# Patient Record
Sex: Female | Born: 1945 | Race: White | Hispanic: No | Marital: Single | Smoking: Current every day smoker
Health system: Southern US, Community
[De-identification: ages and names within clinical notes are randomized; demographics above are authoritative.]

## PROBLEM LIST (undated history)

## (undated) DIAGNOSIS — I712 Thoracic aortic aneurysm, without rupture, unspecified: Secondary | ICD-10-CM

## (undated) DIAGNOSIS — F419 Anxiety disorder, unspecified: Secondary | ICD-10-CM

## (undated) DIAGNOSIS — J449 Chronic obstructive pulmonary disease, unspecified: Secondary | ICD-10-CM

## (undated) DIAGNOSIS — F32A Depression, unspecified: Secondary | ICD-10-CM

## (undated) DIAGNOSIS — T4145XA Adverse effect of unspecified anesthetic, initial encounter: Secondary | ICD-10-CM

## (undated) DIAGNOSIS — T7840XA Allergy, unspecified, initial encounter: Secondary | ICD-10-CM

## (undated) DIAGNOSIS — M1990 Unspecified osteoarthritis, unspecified site: Secondary | ICD-10-CM

## (undated) DIAGNOSIS — H9190 Unspecified hearing loss, unspecified ear: Secondary | ICD-10-CM

## (undated) DIAGNOSIS — D699 Hemorrhagic condition, unspecified: Secondary | ICD-10-CM

## (undated) DIAGNOSIS — E785 Hyperlipidemia, unspecified: Secondary | ICD-10-CM

## (undated) DIAGNOSIS — F329 Major depressive disorder, single episode, unspecified: Secondary | ICD-10-CM

## (undated) DIAGNOSIS — E039 Hypothyroidism, unspecified: Secondary | ICD-10-CM

## (undated) DIAGNOSIS — I639 Cerebral infarction, unspecified: Secondary | ICD-10-CM

## (undated) DIAGNOSIS — T8859XA Other complications of anesthesia, initial encounter: Secondary | ICD-10-CM

## (undated) DIAGNOSIS — A809 Acute poliomyelitis, unspecified: Secondary | ICD-10-CM

## (undated) DIAGNOSIS — S52502A Unspecified fracture of the lower end of left radius, initial encounter for closed fracture: Secondary | ICD-10-CM

## (undated) DIAGNOSIS — C50919 Malignant neoplasm of unspecified site of unspecified female breast: Secondary | ICD-10-CM

## (undated) DIAGNOSIS — F17200 Nicotine dependence, unspecified, uncomplicated: Secondary | ICD-10-CM

## (undated) DIAGNOSIS — M23349 Other meniscus derangements, anterior horn of lateral meniscus, unspecified knee: Secondary | ICD-10-CM

## (undated) DIAGNOSIS — D126 Benign neoplasm of colon, unspecified: Secondary | ICD-10-CM

## (undated) DIAGNOSIS — K219 Gastro-esophageal reflux disease without esophagitis: Secondary | ICD-10-CM

## (undated) DIAGNOSIS — I10 Essential (primary) hypertension: Secondary | ICD-10-CM

## (undated) HISTORY — DX: Depression, unspecified: F32.A

## (undated) HISTORY — DX: Allergy, unspecified, initial encounter: T78.40XA

## (undated) HISTORY — DX: Benign neoplasm of colon, unspecified: D12.6

## (undated) HISTORY — PX: EYE SURGERY: SHX253

## (undated) HISTORY — DX: Unspecified osteoarthritis, unspecified site: M19.90

## (undated) HISTORY — DX: Anxiety disorder, unspecified: F41.9

## (undated) HISTORY — DX: Hemorrhagic condition, unspecified: D69.9

## (undated) HISTORY — PX: BREAST SURGERY: SHX581

## (undated) HISTORY — DX: Cerebral infarction, unspecified: I63.9

## (undated) HISTORY — DX: Acute poliomyelitis, unspecified: A80.9

## (undated) HISTORY — DX: Other meniscus derangements, anterior horn of lateral meniscus, unspecified knee: M23.349

## (undated) HISTORY — DX: Major depressive disorder, single episode, unspecified: F32.9

## (undated) HISTORY — DX: Unspecified hearing loss, unspecified ear: H91.90

## (undated) HISTORY — DX: Hyperlipidemia, unspecified: E78.5

---
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
## 2005-12-26 ENCOUNTER — Encounter (INDEPENDENT_AMBULATORY_CARE_PROVIDER_SITE_OTHER): Payer: Self-pay | Admitting: Radiology

## 2005-12-26 ENCOUNTER — Encounter (INDEPENDENT_AMBULATORY_CARE_PROVIDER_SITE_OTHER): Payer: Self-pay | Admitting: Specialist

## 2005-12-26 ENCOUNTER — Encounter: Admission: RE | Admit: 2005-12-26 | Discharge: 2005-12-26 | Payer: Self-pay | Admitting: Family Medicine

## 2005-12-30 ENCOUNTER — Ambulatory Visit: Payer: Self-pay | Admitting: Oncology

## 2006-01-05 ENCOUNTER — Encounter: Admission: RE | Admit: 2006-01-05 | Discharge: 2006-01-05 | Payer: Self-pay | Admitting: *Deleted

## 2006-01-09 ENCOUNTER — Encounter (INDEPENDENT_AMBULATORY_CARE_PROVIDER_SITE_OTHER): Payer: Self-pay | Admitting: Internal Medicine

## 2006-01-09 LAB — CONVERTED CEMR LAB

## 2006-01-17 ENCOUNTER — Encounter: Admission: RE | Admit: 2006-01-17 | Discharge: 2006-01-17 | Payer: Self-pay | Admitting: *Deleted

## 2006-01-20 ENCOUNTER — Encounter: Admission: RE | Admit: 2006-01-20 | Discharge: 2006-01-20 | Payer: Self-pay | Admitting: *Deleted

## 2006-01-20 ENCOUNTER — Ambulatory Visit (HOSPITAL_BASED_OUTPATIENT_CLINIC_OR_DEPARTMENT_OTHER): Admission: RE | Admit: 2006-01-20 | Discharge: 2006-01-20 | Payer: Self-pay | Admitting: *Deleted

## 2006-01-20 ENCOUNTER — Encounter (INDEPENDENT_AMBULATORY_CARE_PROVIDER_SITE_OTHER): Payer: Self-pay | Admitting: Specialist

## 2006-02-10 ENCOUNTER — Ambulatory Visit: Admission: RE | Admit: 2006-02-10 | Discharge: 2006-02-21 | Payer: Self-pay | Admitting: *Deleted

## 2006-02-19 ENCOUNTER — Ambulatory Visit: Payer: Self-pay | Admitting: Oncology

## 2006-02-24 ENCOUNTER — Encounter (INDEPENDENT_AMBULATORY_CARE_PROVIDER_SITE_OTHER): Payer: Self-pay | Admitting: Internal Medicine

## 2006-03-05 ENCOUNTER — Ambulatory Visit: Admission: RE | Admit: 2006-03-05 | Discharge: 2006-05-15 | Payer: Self-pay | Admitting: *Deleted

## 2006-04-17 ENCOUNTER — Ambulatory Visit: Payer: Self-pay | Admitting: Oncology

## 2006-05-13 LAB — CBC WITH DIFFERENTIAL/PLATELET
Basophils Absolute: 0 10*3/uL (ref 0.0–0.1)
EOS%: 2.9 % (ref 0.0–7.0)
HCT: 43.7 % (ref 34.8–46.6)
HGB: 15.1 g/dL (ref 11.6–15.9)
MCH: 33.4 pg (ref 26.0–34.0)
MCV: 96.7 fL (ref 81.0–101.0)
NEUT%: 62.3 % (ref 39.6–76.8)
lymph#: 1.6 10*3/uL (ref 0.9–3.3)

## 2006-05-13 LAB — COMPREHENSIVE METABOLIC PANEL
AST: 16 U/L (ref 0–37)
BUN: 8 mg/dL (ref 6–23)
Calcium: 9.5 mg/dL (ref 8.4–10.5)
Chloride: 106 mEq/L (ref 96–112)
Creatinine, Ser: 0.84 mg/dL (ref 0.40–1.20)

## 2006-06-24 ENCOUNTER — Ambulatory Visit: Payer: Self-pay | Admitting: Oncology

## 2006-06-27 LAB — CBC WITH DIFFERENTIAL/PLATELET
BASO%: 0.3 % (ref 0.0–2.0)
EOS%: 2.9 % (ref 0.0–7.0)
HCT: 44.6 % (ref 34.8–46.6)
LYMPH%: 20 % (ref 14.0–48.0)
MCH: 33.7 pg (ref 26.0–34.0)
MCHC: 34.6 g/dL (ref 32.0–36.0)
NEUT%: 66.9 % (ref 39.6–76.8)
Platelets: 271 10*3/uL (ref 145–400)

## 2006-06-27 LAB — CANCER ANTIGEN 27.29: CA 27.29: 43 U/mL — ABNORMAL HIGH (ref 0–39)

## 2006-06-27 LAB — COMPREHENSIVE METABOLIC PANEL
ALT: 14 U/L (ref 0–40)
AST: 14 U/L (ref 0–37)
Alkaline Phosphatase: 76 U/L (ref 39–117)
Creatinine, Ser: 0.87 mg/dL (ref 0.40–1.20)
Total Bilirubin: 0.9 mg/dL (ref 0.3–1.2)

## 2006-07-18 LAB — COMPREHENSIVE METABOLIC PANEL
Alkaline Phosphatase: 69 U/L (ref 39–117)
CO2: 21 mEq/L (ref 19–32)
Creatinine, Ser: 0.89 mg/dL (ref 0.40–1.20)
Glucose, Bld: 187 mg/dL — ABNORMAL HIGH (ref 70–99)
Sodium: 138 mEq/L (ref 135–145)
Total Bilirubin: 1 mg/dL (ref 0.3–1.2)
Total Protein: 6.6 g/dL (ref 6.0–8.3)

## 2006-07-18 LAB — CBC WITH DIFFERENTIAL/PLATELET
BASO%: 0.4 % (ref 0.0–2.0)
HCT: 43.4 % (ref 34.8–46.6)
LYMPH%: 26.9 % (ref 14.0–48.0)
MCHC: 34.8 g/dL (ref 32.0–36.0)
MCV: 97.7 fL (ref 81.0–101.0)
MONO%: 6 % (ref 0.0–13.0)
NEUT%: 63.7 % (ref 39.6–76.8)
Platelets: 249 10*3/uL (ref 145–400)
RBC: 4.45 10*6/uL (ref 3.70–5.32)

## 2006-07-18 LAB — CANCER ANTIGEN 27.29: CA 27.29: 33 U/mL (ref 0–39)

## 2006-09-04 ENCOUNTER — Ambulatory Visit (HOSPITAL_COMMUNITY): Admission: RE | Admit: 2006-09-04 | Discharge: 2006-09-04 | Payer: Self-pay | Admitting: Cardiology

## 2006-09-04 ENCOUNTER — Ambulatory Visit: Payer: Self-pay | Admitting: Oncology

## 2006-12-19 ENCOUNTER — Ambulatory Visit: Payer: Self-pay | Admitting: Oncology

## 2007-01-27 ENCOUNTER — Ambulatory Visit: Payer: Self-pay | Admitting: Internal Medicine

## 2007-02-03 ENCOUNTER — Ambulatory Visit: Payer: Self-pay | Admitting: *Deleted

## 2007-02-10 ENCOUNTER — Ambulatory Visit: Payer: Self-pay | Admitting: Internal Medicine

## 2007-02-20 ENCOUNTER — Ambulatory Visit: Payer: Self-pay | Admitting: Internal Medicine

## 2007-02-26 ENCOUNTER — Ambulatory Visit: Payer: Self-pay | Admitting: Internal Medicine

## 2007-03-05 ENCOUNTER — Ambulatory Visit: Payer: Self-pay | Admitting: Oncology

## 2007-03-10 LAB — CBC WITH DIFFERENTIAL/PLATELET
BASO%: 0.5 % (ref 0.0–2.0)
EOS%: 2.1 % (ref 0.0–7.0)
LYMPH%: 29 % (ref 14.0–48.0)
MCH: 33.4 pg (ref 26.0–34.0)
MCHC: 35.6 g/dL (ref 32.0–36.0)
MONO#: 0.6 10*3/uL (ref 0.1–0.9)
MONO%: 9.5 % (ref 0.0–13.0)
Platelets: 227 10*3/uL (ref 145–400)
RBC: 4.39 10*6/uL (ref 3.70–5.32)
WBC: 6.3 10*3/uL (ref 3.9–10.0)

## 2007-03-16 LAB — URINALYSIS, MICROSCOPIC - CHCC
Glucose: NEGATIVE g/dL
Nitrite: NEGATIVE

## 2007-03-26 ENCOUNTER — Ambulatory Visit: Payer: Self-pay | Admitting: Internal Medicine

## 2007-04-06 ENCOUNTER — Ambulatory Visit (HOSPITAL_COMMUNITY): Admission: RE | Admit: 2007-04-06 | Discharge: 2007-04-06 | Payer: Self-pay | Admitting: Internal Medicine

## 2007-04-16 ENCOUNTER — Ambulatory Visit: Payer: Self-pay | Admitting: Internal Medicine

## 2007-04-16 ENCOUNTER — Encounter (INDEPENDENT_AMBULATORY_CARE_PROVIDER_SITE_OTHER): Payer: Self-pay | Admitting: Internal Medicine

## 2007-05-26 ENCOUNTER — Ambulatory Visit: Payer: Self-pay | Admitting: Internal Medicine

## 2007-05-29 ENCOUNTER — Ambulatory Visit (HOSPITAL_COMMUNITY): Admission: RE | Admit: 2007-05-29 | Discharge: 2007-05-29 | Payer: Self-pay | Admitting: Internal Medicine

## 2007-06-19 ENCOUNTER — Ambulatory Visit: Payer: Self-pay | Admitting: Internal Medicine

## 2007-07-09 ENCOUNTER — Ambulatory Visit: Payer: Self-pay | Admitting: Internal Medicine

## 2007-07-10 ENCOUNTER — Encounter (INDEPENDENT_AMBULATORY_CARE_PROVIDER_SITE_OTHER): Payer: Self-pay | Admitting: Internal Medicine

## 2007-07-10 LAB — CONVERTED CEMR LAB
ALT: 16 units/L (ref 0–35)
AST: 19 units/L (ref 0–37)
CO2: 24 meq/L (ref 19–32)
Calcium: 9.3 mg/dL (ref 8.4–10.5)
Chloride: 99 meq/L (ref 96–112)
Eosinophils Absolute: 0.4 10*3/uL (ref 0.0–0.7)
Hemoglobin: 14.8 g/dL (ref 12.0–15.0)
Lymphocytes Relative: 30 % (ref 12–46)
MCV: 99.1 fL (ref 78.0–100.0)
Monocytes Absolute: 0.7 10*3/uL (ref 0.2–0.7)
Monocytes Relative: 11 % (ref 3–11)
Potassium: 4.4 meq/L (ref 3.5–5.3)
RBC: 4.43 M/uL (ref 3.87–5.11)
Sodium: 135 meq/L (ref 135–145)
Total Bilirubin: 0.7 mg/dL (ref 0.3–1.2)
WBC: 6.3 10*3/uL (ref 4.0–10.5)

## 2007-08-21 ENCOUNTER — Encounter (INDEPENDENT_AMBULATORY_CARE_PROVIDER_SITE_OTHER): Payer: Self-pay | Admitting: Internal Medicine

## 2007-08-21 DIAGNOSIS — E785 Hyperlipidemia, unspecified: Secondary | ICD-10-CM

## 2007-08-21 DIAGNOSIS — K219 Gastro-esophageal reflux disease without esophagitis: Secondary | ICD-10-CM | POA: Insufficient documentation

## 2007-08-21 DIAGNOSIS — J4489 Other specified chronic obstructive pulmonary disease: Secondary | ICD-10-CM | POA: Insufficient documentation

## 2007-08-21 DIAGNOSIS — J449 Chronic obstructive pulmonary disease, unspecified: Secondary | ICD-10-CM

## 2007-08-26 ENCOUNTER — Telehealth (INDEPENDENT_AMBULATORY_CARE_PROVIDER_SITE_OTHER): Payer: Self-pay | Admitting: Internal Medicine

## 2007-08-26 ENCOUNTER — Encounter (INDEPENDENT_AMBULATORY_CARE_PROVIDER_SITE_OTHER): Payer: Self-pay | Admitting: *Deleted

## 2007-09-03 ENCOUNTER — Ambulatory Visit: Payer: Self-pay | Admitting: Oncology

## 2007-09-07 LAB — COMPREHENSIVE METABOLIC PANEL
ALT: 19 U/L (ref 0–35)
AST: 22 U/L (ref 0–37)
Albumin: 4.5 g/dL (ref 3.5–5.2)
Alkaline Phosphatase: 58 U/L (ref 39–117)
BUN: 9 mg/dL (ref 6–23)
CO2: 25 mEq/L (ref 19–32)
Calcium: 9.7 mg/dL (ref 8.4–10.5)
Chloride: 104 mEq/L (ref 96–112)
Creatinine, Ser: 0.94 mg/dL (ref 0.40–1.20)
Glucose, Bld: 101 mg/dL — ABNORMAL HIGH (ref 70–99)
Potassium: 4.1 mEq/L (ref 3.5–5.3)
Sodium: 139 mEq/L (ref 135–145)
Total Bilirubin: 0.9 mg/dL (ref 0.3–1.2)
Total Protein: 6.9 g/dL (ref 6.0–8.3)

## 2007-09-07 LAB — CBC WITH DIFFERENTIAL/PLATELET
EOS%: 2.9 % (ref 0.0–7.0)
LYMPH%: 28.3 % (ref 14.0–48.0)
MCH: 34.5 pg — ABNORMAL HIGH (ref 26.0–34.0)
MCV: 95.8 fL (ref 81.0–101.0)
MONO%: 8.4 % (ref 0.0–13.0)
Platelets: 255 10*3/uL (ref 145–400)
RBC: 4.44 10*6/uL (ref 3.70–5.32)
RDW: 13.5 % (ref 11.3–14.5)

## 2007-09-07 LAB — CANCER ANTIGEN 27.29: CA 27.29: 31 U/mL (ref 0–39)

## 2007-09-15 ENCOUNTER — Encounter (INDEPENDENT_AMBULATORY_CARE_PROVIDER_SITE_OTHER): Payer: Self-pay | Admitting: Internal Medicine

## 2007-09-25 ENCOUNTER — Ambulatory Visit: Payer: Self-pay | Admitting: Internal Medicine

## 2007-10-08 ENCOUNTER — Ambulatory Visit: Payer: Self-pay | Admitting: Internal Medicine

## 2007-10-08 DIAGNOSIS — D509 Iron deficiency anemia, unspecified: Secondary | ICD-10-CM | POA: Insufficient documentation

## 2007-10-26 ENCOUNTER — Telehealth (INDEPENDENT_AMBULATORY_CARE_PROVIDER_SITE_OTHER): Payer: Self-pay | Admitting: Internal Medicine

## 2007-11-03 ENCOUNTER — Encounter (INDEPENDENT_AMBULATORY_CARE_PROVIDER_SITE_OTHER): Payer: Self-pay | Admitting: Internal Medicine

## 2007-11-03 ENCOUNTER — Telehealth (INDEPENDENT_AMBULATORY_CARE_PROVIDER_SITE_OTHER): Payer: Self-pay | Admitting: Internal Medicine

## 2007-11-03 DIAGNOSIS — Z853 Personal history of malignant neoplasm of breast: Secondary | ICD-10-CM

## 2007-11-03 LAB — CONVERTED CEMR LAB
Iron: 114 ug/dL (ref 42–145)
Saturation Ratios: 31 % (ref 20–55)
Vitamin B-12: 451 pg/mL (ref 211–911)

## 2007-12-14 ENCOUNTER — Encounter: Admission: RE | Admit: 2007-12-14 | Discharge: 2007-12-14 | Payer: Self-pay | Admitting: Internal Medicine

## 2008-01-06 ENCOUNTER — Ambulatory Visit: Payer: Self-pay | Admitting: Internal Medicine

## 2008-02-02 ENCOUNTER — Ambulatory Visit: Payer: Self-pay | Admitting: Internal Medicine

## 2008-02-02 DIAGNOSIS — I10 Essential (primary) hypertension: Secondary | ICD-10-CM | POA: Insufficient documentation

## 2008-02-04 ENCOUNTER — Telehealth (INDEPENDENT_AMBULATORY_CARE_PROVIDER_SITE_OTHER): Payer: Self-pay | Admitting: Internal Medicine

## 2008-02-11 ENCOUNTER — Ambulatory Visit: Payer: Self-pay | Admitting: Internal Medicine

## 2008-02-11 DIAGNOSIS — M899 Disorder of bone, unspecified: Secondary | ICD-10-CM | POA: Insufficient documentation

## 2008-02-11 DIAGNOSIS — M949 Disorder of cartilage, unspecified: Secondary | ICD-10-CM

## 2008-02-17 ENCOUNTER — Encounter (INDEPENDENT_AMBULATORY_CARE_PROVIDER_SITE_OTHER): Payer: Self-pay | Admitting: Internal Medicine

## 2008-02-17 ENCOUNTER — Telehealth (INDEPENDENT_AMBULATORY_CARE_PROVIDER_SITE_OTHER): Payer: Self-pay | Admitting: *Deleted

## 2008-02-17 ENCOUNTER — Ambulatory Visit (HOSPITAL_COMMUNITY): Admission: RE | Admit: 2008-02-17 | Discharge: 2008-02-17 | Payer: Self-pay | Admitting: Internal Medicine

## 2008-02-29 ENCOUNTER — Telehealth (INDEPENDENT_AMBULATORY_CARE_PROVIDER_SITE_OTHER): Payer: Self-pay | Admitting: Internal Medicine

## 2008-03-04 ENCOUNTER — Ambulatory Visit: Payer: Self-pay | Admitting: Oncology

## 2008-03-15 ENCOUNTER — Ambulatory Visit: Payer: Self-pay | Admitting: Internal Medicine

## 2008-03-15 DIAGNOSIS — F411 Generalized anxiety disorder: Secondary | ICD-10-CM | POA: Insufficient documentation

## 2008-03-15 DIAGNOSIS — F329 Major depressive disorder, single episode, unspecified: Secondary | ICD-10-CM | POA: Insufficient documentation

## 2008-03-15 DIAGNOSIS — F1010 Alcohol abuse, uncomplicated: Secondary | ICD-10-CM | POA: Insufficient documentation

## 2008-03-16 ENCOUNTER — Telehealth (INDEPENDENT_AMBULATORY_CARE_PROVIDER_SITE_OTHER): Payer: Self-pay | Admitting: Internal Medicine

## 2008-03-23 ENCOUNTER — Ambulatory Visit: Payer: Self-pay | Admitting: Psychiatry

## 2008-04-04 ENCOUNTER — Ambulatory Visit: Payer: Self-pay | Admitting: Psychiatry

## 2008-04-07 ENCOUNTER — Telehealth (INDEPENDENT_AMBULATORY_CARE_PROVIDER_SITE_OTHER): Payer: Self-pay | Admitting: Internal Medicine

## 2008-04-13 ENCOUNTER — Ambulatory Visit: Payer: Self-pay | Admitting: Psychiatry

## 2008-04-18 ENCOUNTER — Ambulatory Visit: Payer: Self-pay | Admitting: Psychiatry

## 2008-04-25 ENCOUNTER — Ambulatory Visit: Payer: Self-pay | Admitting: Psychiatry

## 2008-05-03 ENCOUNTER — Ambulatory Visit: Payer: Self-pay | Admitting: Psychiatry

## 2008-05-06 ENCOUNTER — Ambulatory Visit: Payer: Self-pay | Admitting: Internal Medicine

## 2008-05-11 ENCOUNTER — Ambulatory Visit: Payer: Self-pay | Admitting: Psychiatry

## 2008-05-18 ENCOUNTER — Ambulatory Visit: Payer: Self-pay | Admitting: Psychiatry

## 2008-05-18 ENCOUNTER — Ambulatory Visit: Payer: Self-pay | Admitting: Oncology

## 2008-05-30 ENCOUNTER — Ambulatory Visit: Payer: Self-pay | Admitting: Psychiatry

## 2008-06-06 ENCOUNTER — Ambulatory Visit: Payer: Self-pay | Admitting: Psychiatry

## 2008-06-20 ENCOUNTER — Ambulatory Visit: Payer: Self-pay | Admitting: Psychiatry

## 2008-07-07 ENCOUNTER — Encounter (INDEPENDENT_AMBULATORY_CARE_PROVIDER_SITE_OTHER): Payer: Self-pay | Admitting: Internal Medicine

## 2008-07-07 ENCOUNTER — Ambulatory Visit: Payer: Self-pay | Admitting: Internal Medicine

## 2008-07-07 DIAGNOSIS — L570 Actinic keratosis: Secondary | ICD-10-CM

## 2008-07-07 DIAGNOSIS — R5381 Other malaise: Secondary | ICD-10-CM

## 2008-07-07 DIAGNOSIS — E039 Hypothyroidism, unspecified: Secondary | ICD-10-CM

## 2008-07-07 DIAGNOSIS — R5383 Other fatigue: Secondary | ICD-10-CM

## 2008-07-07 LAB — CONVERTED CEMR LAB
ALT: 19 units/L (ref 0–35)
Albumin: 4.6 g/dL (ref 3.5–5.2)
Basophils Absolute: 0.1 10*3/uL (ref 0.0–0.1)
Bilirubin Urine: NEGATIVE
CO2: 23 meq/L (ref 19–32)
Calcium: 10.1 mg/dL (ref 8.4–10.5)
Chlamydia, DNA Probe: NEGATIVE
Chloride: 102 meq/L (ref 96–112)
Cholesterol: 143 mg/dL (ref 0–200)
Creatinine, Ser: 0.89 mg/dL (ref 0.40–1.20)
GC Probe Amp, Genital: NEGATIVE
Hemoglobin: 15.3 g/dL — ABNORMAL HIGH (ref 12.0–15.0)
Lymphocytes Relative: 25 % (ref 12–46)
Monocytes Absolute: 0.9 10*3/uL (ref 0.1–1.0)
Neutro Abs: 4.7 10*3/uL (ref 1.7–7.7)
Neutrophils Relative %: 60 % (ref 43–77)
Potassium: 4.5 meq/L (ref 3.5–5.3)
RBC: 4.86 M/uL (ref 3.87–5.11)
RDW: 15 % (ref 11.5–15.5)
Specific Gravity, Urine: 1.01
TSH: 3.256 microintl units/mL (ref 0.350–4.50)
Total Protein: 7.1 g/dL (ref 6.0–8.3)
Urobilinogen, UA: 0.2
pH: 7

## 2008-07-08 ENCOUNTER — Encounter (INDEPENDENT_AMBULATORY_CARE_PROVIDER_SITE_OTHER): Payer: Self-pay | Admitting: Nurse Practitioner

## 2008-07-08 ENCOUNTER — Encounter (INDEPENDENT_AMBULATORY_CARE_PROVIDER_SITE_OTHER): Payer: Self-pay | Admitting: Internal Medicine

## 2008-07-15 ENCOUNTER — Telehealth (INDEPENDENT_AMBULATORY_CARE_PROVIDER_SITE_OTHER): Payer: Self-pay | Admitting: *Deleted

## 2008-09-01 ENCOUNTER — Ambulatory Visit: Payer: Self-pay | Admitting: Oncology

## 2008-09-08 LAB — CBC WITH DIFFERENTIAL/PLATELET
BASO%: 1.5 % (ref 0.0–2.0)
EOS%: 3.6 % (ref 0.0–7.0)
Eosinophils Absolute: 0.3 10*3/uL (ref 0.0–0.5)
MCH: 32.4 pg (ref 26.0–34.0)
MCHC: 34.9 g/dL (ref 32.0–36.0)
MCV: 92.6 fL (ref 81.0–101.0)
MONO%: 8.1 % (ref 0.0–13.0)
NEUT#: 6.2 10*3/uL (ref 1.5–6.5)
RBC: 4.55 10*6/uL (ref 3.70–5.32)
RDW: 13.4 % (ref 11.3–14.5)

## 2008-09-08 LAB — COMPREHENSIVE METABOLIC PANEL
ALT: 33 U/L (ref 0–35)
AST: 30 U/L (ref 0–37)
Albumin: 4 g/dL (ref 3.5–5.2)
Alkaline Phosphatase: 83 U/L (ref 39–117)
Potassium: 4.2 mEq/L (ref 3.5–5.3)
Sodium: 136 mEq/L (ref 135–145)
Total Bilirubin: 1.2 mg/dL (ref 0.3–1.2)
Total Protein: 6.9 g/dL (ref 6.0–8.3)

## 2008-09-19 ENCOUNTER — Encounter (INDEPENDENT_AMBULATORY_CARE_PROVIDER_SITE_OTHER): Payer: Self-pay | Admitting: Internal Medicine

## 2008-10-28 ENCOUNTER — Ambulatory Visit: Payer: Self-pay | Admitting: Nurse Practitioner

## 2008-10-28 DIAGNOSIS — K589 Irritable bowel syndrome without diarrhea: Secondary | ICD-10-CM | POA: Insufficient documentation

## 2008-10-28 LAB — CONVERTED CEMR LAB
Bilirubin Urine: NEGATIVE
Blood in Urine, dipstick: NEGATIVE
Glucose, Urine, Semiquant: NEGATIVE
Ketones, urine, test strip: NEGATIVE
Protein, U semiquant: NEGATIVE

## 2008-11-01 ENCOUNTER — Encounter (INDEPENDENT_AMBULATORY_CARE_PROVIDER_SITE_OTHER): Payer: Self-pay | Admitting: Internal Medicine

## 2008-11-01 ENCOUNTER — Encounter (INDEPENDENT_AMBULATORY_CARE_PROVIDER_SITE_OTHER): Payer: Self-pay | Admitting: Nurse Practitioner

## 2008-11-07 LAB — CONVERTED CEMR LAB
Basophils Relative: 1 % (ref 0–1)
Eosinophils Absolute: 0.5 10*3/uL (ref 0.0–0.7)
Hemoglobin: 14.7 g/dL (ref 12.0–15.0)
Lymphs Abs: 1.8 10*3/uL (ref 0.7–4.0)
MCHC: 34 g/dL (ref 30.0–36.0)
MCV: 95.6 fL (ref 78.0–100.0)
Monocytes Absolute: 1 10*3/uL (ref 0.1–1.0)
Monocytes Relative: 11 % (ref 3–12)
RBC: 4.52 M/uL (ref 3.87–5.11)
WBC: 8.4 10*3/uL (ref 4.0–10.5)

## 2008-11-25 ENCOUNTER — Telehealth (INDEPENDENT_AMBULATORY_CARE_PROVIDER_SITE_OTHER): Payer: Self-pay | Admitting: Internal Medicine

## 2008-12-19 ENCOUNTER — Encounter (INDEPENDENT_AMBULATORY_CARE_PROVIDER_SITE_OTHER): Payer: Self-pay | Admitting: Internal Medicine

## 2009-02-07 ENCOUNTER — Ambulatory Visit: Payer: Self-pay | Admitting: Internal Medicine

## 2009-02-07 LAB — CONVERTED CEMR LAB
Bilirubin Urine: NEGATIVE
Blood in Urine, dipstick: NEGATIVE
Protein, U semiquant: NEGATIVE
Sed Rate: 3 mm/hr (ref 0–22)
Tissue Transglutaminase Ab, IgA: 0.1 units (ref <7)
Urobilinogen, UA: 0.2

## 2009-02-08 ENCOUNTER — Encounter (INDEPENDENT_AMBULATORY_CARE_PROVIDER_SITE_OTHER): Payer: Self-pay | Admitting: Internal Medicine

## 2009-02-13 ENCOUNTER — Emergency Department (HOSPITAL_COMMUNITY): Admission: EM | Admit: 2009-02-13 | Discharge: 2009-02-13 | Payer: Self-pay | Admitting: Emergency Medicine

## 2009-02-16 ENCOUNTER — Ambulatory Visit: Payer: Self-pay | Admitting: Internal Medicine

## 2009-02-28 ENCOUNTER — Ambulatory Visit: Payer: Self-pay | Admitting: Internal Medicine

## 2009-02-28 LAB — CONVERTED CEMR LAB: Anti Nuclear Antibody(ANA): NEGATIVE

## 2009-03-01 ENCOUNTER — Encounter (INDEPENDENT_AMBULATORY_CARE_PROVIDER_SITE_OTHER): Payer: Self-pay | Admitting: Internal Medicine

## 2009-03-07 ENCOUNTER — Telehealth (INDEPENDENT_AMBULATORY_CARE_PROVIDER_SITE_OTHER): Payer: Self-pay | Admitting: Internal Medicine

## 2009-03-09 ENCOUNTER — Ambulatory Visit: Payer: Self-pay | Admitting: Oncology

## 2009-03-13 ENCOUNTER — Encounter: Admission: RE | Admit: 2009-03-13 | Discharge: 2009-04-13 | Payer: Self-pay | Admitting: Internal Medicine

## 2009-03-13 ENCOUNTER — Encounter (INDEPENDENT_AMBULATORY_CARE_PROVIDER_SITE_OTHER): Payer: Self-pay | Admitting: Internal Medicine

## 2009-03-14 LAB — CBC WITH DIFFERENTIAL/PLATELET
BASO%: 0.5 % (ref 0.0–2.0)
LYMPH%: 30 % (ref 14.0–49.7)
MCHC: 34.6 g/dL (ref 31.5–36.0)
MONO#: 0.5 10*3/uL (ref 0.1–0.9)
Platelets: 254 10*3/uL (ref 145–400)
RBC: 4.27 10*6/uL (ref 3.70–5.45)
WBC: 6.2 10*3/uL (ref 3.9–10.3)

## 2009-03-15 LAB — CANCER ANTIGEN 27.29: CA 27.29: 34 U/mL (ref 0–39)

## 2009-03-15 LAB — COMPREHENSIVE METABOLIC PANEL
ALT: 17 U/L (ref 0–35)
AST: 16 U/L (ref 0–37)
Alkaline Phosphatase: 77 U/L (ref 39–117)
CO2: 21 mEq/L (ref 19–32)
Sodium: 134 mEq/L — ABNORMAL LOW (ref 135–145)
Total Bilirubin: 0.6 mg/dL (ref 0.3–1.2)
Total Protein: 6.7 g/dL (ref 6.0–8.3)

## 2009-03-24 ENCOUNTER — Encounter (INDEPENDENT_AMBULATORY_CARE_PROVIDER_SITE_OTHER): Payer: Self-pay | Admitting: Internal Medicine

## 2009-03-24 ENCOUNTER — Encounter: Admission: RE | Admit: 2009-03-24 | Discharge: 2009-03-24 | Payer: Self-pay | Admitting: Oncology

## 2009-03-27 ENCOUNTER — Encounter (INDEPENDENT_AMBULATORY_CARE_PROVIDER_SITE_OTHER): Payer: Self-pay | Admitting: Internal Medicine

## 2009-03-28 ENCOUNTER — Ambulatory Visit: Payer: Self-pay | Admitting: Internal Medicine

## 2009-03-28 LAB — CONVERTED CEMR LAB
Bilirubin Urine: NEGATIVE
Glucose, Urine, Semiquant: NEGATIVE
Protein, U semiquant: NEGATIVE
Specific Gravity, Urine: <1.005
WBC Urine, dipstick: NEGATIVE

## 2009-03-29 ENCOUNTER — Encounter (INDEPENDENT_AMBULATORY_CARE_PROVIDER_SITE_OTHER): Payer: Self-pay | Admitting: Internal Medicine

## 2009-04-03 ENCOUNTER — Ambulatory Visit: Payer: Self-pay | Admitting: Internal Medicine

## 2009-04-03 LAB — CONVERTED CEMR LAB
BUN: 15 mg/dL (ref 6–23)
Chloride: 107 meq/L (ref 96–112)
Potassium: 4 meq/L (ref 3.5–5.3)
Sodium: 143 meq/L (ref 135–145)

## 2009-04-05 ENCOUNTER — Ambulatory Visit (HOSPITAL_COMMUNITY): Admission: RE | Admit: 2009-04-05 | Discharge: 2009-04-05 | Payer: Self-pay | Admitting: Internal Medicine

## 2009-04-07 ENCOUNTER — Telehealth (INDEPENDENT_AMBULATORY_CARE_PROVIDER_SITE_OTHER): Payer: Self-pay | Admitting: Internal Medicine

## 2009-04-14 ENCOUNTER — Ambulatory Visit: Payer: Self-pay | Admitting: Internal Medicine

## 2009-04-18 ENCOUNTER — Ambulatory Visit (HOSPITAL_COMMUNITY): Admission: RE | Admit: 2009-04-18 | Discharge: 2009-04-18 | Payer: Self-pay | Admitting: Internal Medicine

## 2009-04-19 ENCOUNTER — Ambulatory Visit: Payer: Self-pay | Admitting: Internal Medicine

## 2009-04-19 LAB — CONVERTED CEMR LAB
Bilirubin Urine: NEGATIVE
Blood in Urine, dipstick: NEGATIVE
Glucose, Urine, Semiquant: NEGATIVE
Ketones, urine, test strip: NEGATIVE
Protein, U semiquant: NEGATIVE
Urobilinogen, UA: 0.2
pH: 5.5

## 2009-04-24 ENCOUNTER — Encounter (INDEPENDENT_AMBULATORY_CARE_PROVIDER_SITE_OTHER): Payer: Self-pay | Admitting: Internal Medicine

## 2009-04-24 DIAGNOSIS — M23349 Other meniscus derangements, anterior horn of lateral meniscus, unspecified knee: Secondary | ICD-10-CM | POA: Insufficient documentation

## 2009-04-24 HISTORY — DX: Other meniscus derangements, anterior horn of lateral meniscus, unspecified knee: M23.349

## 2009-05-01 ENCOUNTER — Ambulatory Visit: Payer: Self-pay | Admitting: Oncology

## 2009-05-03 ENCOUNTER — Encounter (INDEPENDENT_AMBULATORY_CARE_PROVIDER_SITE_OTHER): Payer: Self-pay | Admitting: Internal Medicine

## 2009-05-04 ENCOUNTER — Telehealth (INDEPENDENT_AMBULATORY_CARE_PROVIDER_SITE_OTHER): Payer: Self-pay | Admitting: Internal Medicine

## 2009-05-11 ENCOUNTER — Telehealth (INDEPENDENT_AMBULATORY_CARE_PROVIDER_SITE_OTHER): Payer: Self-pay | Admitting: Internal Medicine

## 2009-05-11 ENCOUNTER — Encounter (INDEPENDENT_AMBULATORY_CARE_PROVIDER_SITE_OTHER): Payer: Self-pay | Admitting: Internal Medicine

## 2009-05-18 ENCOUNTER — Telehealth (INDEPENDENT_AMBULATORY_CARE_PROVIDER_SITE_OTHER): Payer: Self-pay | Admitting: Internal Medicine

## 2009-05-19 ENCOUNTER — Ambulatory Visit: Payer: Self-pay | Admitting: Internal Medicine

## 2009-05-19 LAB — CONVERTED CEMR LAB
Bilirubin Urine: NEGATIVE
Blood in Urine, dipstick: NEGATIVE
Glucose, Urine, Semiquant: NEGATIVE
Ketones, urine, test strip: NEGATIVE
Protein, U semiquant: NEGATIVE

## 2009-05-20 ENCOUNTER — Encounter (INDEPENDENT_AMBULATORY_CARE_PROVIDER_SITE_OTHER): Payer: Self-pay | Admitting: Nurse Practitioner

## 2009-05-25 ENCOUNTER — Telehealth (INDEPENDENT_AMBULATORY_CARE_PROVIDER_SITE_OTHER): Payer: Self-pay | Admitting: Internal Medicine

## 2009-05-31 ENCOUNTER — Telehealth (INDEPENDENT_AMBULATORY_CARE_PROVIDER_SITE_OTHER): Payer: Self-pay | Admitting: Internal Medicine

## 2009-06-02 ENCOUNTER — Ambulatory Visit: Payer: Self-pay | Admitting: Internal Medicine

## 2009-06-02 LAB — CONVERTED CEMR LAB
Bilirubin Urine: NEGATIVE
Glucose, Urine, Semiquant: NEGATIVE
Ketones, urine, test strip: NEGATIVE
Protein, U semiquant: NEGATIVE
Specific Gravity, Urine: <1.005
Urobilinogen, UA: 0.2

## 2009-06-08 ENCOUNTER — Telehealth (INDEPENDENT_AMBULATORY_CARE_PROVIDER_SITE_OTHER): Payer: Self-pay | Admitting: Internal Medicine

## 2009-06-16 ENCOUNTER — Encounter (INDEPENDENT_AMBULATORY_CARE_PROVIDER_SITE_OTHER): Payer: Self-pay | Admitting: Internal Medicine

## 2009-06-19 ENCOUNTER — Encounter (INDEPENDENT_AMBULATORY_CARE_PROVIDER_SITE_OTHER): Payer: Self-pay | Admitting: Internal Medicine

## 2009-07-05 ENCOUNTER — Ambulatory Visit: Payer: Self-pay | Admitting: Internal Medicine

## 2009-07-05 LAB — CONVERTED CEMR LAB
Bilirubin Urine: NEGATIVE
Blood in Urine, dipstick: NEGATIVE
Ketones, urine, test strip: NEGATIVE
Urobilinogen, UA: 0.2
pH: 6

## 2009-07-06 ENCOUNTER — Encounter (INDEPENDENT_AMBULATORY_CARE_PROVIDER_SITE_OTHER): Payer: Self-pay | Admitting: Internal Medicine

## 2009-07-12 ENCOUNTER — Ambulatory Visit (HOSPITAL_COMMUNITY): Admission: RE | Admit: 2009-07-12 | Discharge: 2009-07-12 | Payer: Self-pay | Admitting: Pulmonary Disease

## 2009-07-12 ENCOUNTER — Encounter (INDEPENDENT_AMBULATORY_CARE_PROVIDER_SITE_OTHER): Payer: Self-pay | Admitting: Gastroenterology

## 2009-07-12 ENCOUNTER — Encounter (INDEPENDENT_AMBULATORY_CARE_PROVIDER_SITE_OTHER): Payer: Self-pay | Admitting: Internal Medicine

## 2009-07-12 DIAGNOSIS — D126 Benign neoplasm of colon, unspecified: Secondary | ICD-10-CM

## 2009-07-12 HISTORY — DX: Benign neoplasm of colon, unspecified: D12.6

## 2009-07-13 ENCOUNTER — Encounter (INDEPENDENT_AMBULATORY_CARE_PROVIDER_SITE_OTHER): Payer: Self-pay | Admitting: Internal Medicine

## 2009-07-20 ENCOUNTER — Ambulatory Visit: Payer: Self-pay | Admitting: Nurse Practitioner

## 2009-07-24 ENCOUNTER — Encounter (INDEPENDENT_AMBULATORY_CARE_PROVIDER_SITE_OTHER): Payer: Self-pay | Admitting: Internal Medicine

## 2009-07-25 ENCOUNTER — Ambulatory Visit: Payer: Self-pay | Admitting: Oncology

## 2009-07-26 ENCOUNTER — Telehealth (INDEPENDENT_AMBULATORY_CARE_PROVIDER_SITE_OTHER): Payer: Self-pay | Admitting: Internal Medicine

## 2009-07-27 LAB — COMPREHENSIVE METABOLIC PANEL
AST: 16 U/L (ref 0–37)
Albumin: 4.4 g/dL (ref 3.5–5.2)
Alkaline Phosphatase: 70 U/L (ref 39–117)
BUN: 11 mg/dL (ref 6–23)
Creatinine, Ser: 0.97 mg/dL (ref 0.40–1.20)
Potassium: 4.2 mEq/L (ref 3.5–5.3)
Total Bilirubin: 0.7 mg/dL (ref 0.3–1.2)

## 2009-07-27 LAB — CBC WITH DIFFERENTIAL/PLATELET
Basophils Absolute: 0 10*3/uL (ref 0.0–0.1)
EOS%: 4.1 % (ref 0.0–7.0)
HGB: 14 g/dL (ref 11.6–15.9)
MCH: 33.6 pg (ref 25.1–34.0)
MCV: 95.5 fL (ref 79.5–101.0)
MONO%: 10 % (ref 0.0–14.0)
RDW: 13.5 % (ref 11.2–14.5)

## 2009-07-27 LAB — CANCER ANTIGEN 27.29: CA 27.29: 37 U/mL (ref 0–39)

## 2009-07-30 ENCOUNTER — Telehealth (INDEPENDENT_AMBULATORY_CARE_PROVIDER_SITE_OTHER): Payer: Self-pay | Admitting: *Deleted

## 2009-08-03 ENCOUNTER — Encounter (INDEPENDENT_AMBULATORY_CARE_PROVIDER_SITE_OTHER): Payer: Self-pay | Admitting: Internal Medicine

## 2009-08-07 ENCOUNTER — Telehealth (INDEPENDENT_AMBULATORY_CARE_PROVIDER_SITE_OTHER): Payer: Self-pay | Admitting: Internal Medicine

## 2009-08-18 ENCOUNTER — Telehealth (INDEPENDENT_AMBULATORY_CARE_PROVIDER_SITE_OTHER): Payer: Self-pay | Admitting: Internal Medicine

## 2009-09-07 ENCOUNTER — Ambulatory Visit: Payer: Self-pay | Admitting: Internal Medicine

## 2009-09-07 DIAGNOSIS — J309 Allergic rhinitis, unspecified: Secondary | ICD-10-CM | POA: Insufficient documentation

## 2009-09-07 LAB — CONVERTED CEMR LAB
BUN: 14 mg/dL (ref 6–23)
Blood in Urine, dipstick: NEGATIVE
CO2: 25 meq/L (ref 19–32)
Calcium: 9.8 mg/dL (ref 8.4–10.5)
Chloride: 108 meq/L (ref 96–112)
Creatinine, Ser: 0.8 mg/dL (ref 0.40–1.20)
Eosinophils Absolute: 0.3 10*3/uL (ref 0.0–0.7)
Eosinophils Relative: 4 % (ref 0–5)
Glucose, Bld: 99 mg/dL (ref 70–99)
Glucose, Urine, Semiquant: NEGATIVE
HCT: 43.8 % (ref 36.0–46.0)
Ketones, urine, test strip: NEGATIVE
Lymphs Abs: 1.6 10*3/uL (ref 0.7–4.0)
MCHC: 32.4 g/dL (ref 30.0–36.0)
MCV: 99.8 fL (ref 78.0–100.0)
Monocytes Relative: 11 % (ref 3–12)
Nitrite: NEGATIVE
Protein, U semiquant: NEGATIVE
RBC: 4.39 M/uL (ref 3.87–5.11)
TSH: 5.113 microintl units/mL — ABNORMAL HIGH (ref 0.350–4.500)
WBC: 7.4 10*3/uL (ref 4.0–10.5)

## 2009-09-14 ENCOUNTER — Encounter (INDEPENDENT_AMBULATORY_CARE_PROVIDER_SITE_OTHER): Payer: Self-pay | Admitting: Family Medicine

## 2009-09-14 ENCOUNTER — Telehealth (INDEPENDENT_AMBULATORY_CARE_PROVIDER_SITE_OTHER): Payer: Self-pay | Admitting: Internal Medicine

## 2009-09-22 ENCOUNTER — Encounter (INDEPENDENT_AMBULATORY_CARE_PROVIDER_SITE_OTHER): Payer: Self-pay | Admitting: Internal Medicine

## 2009-10-04 ENCOUNTER — Encounter (INDEPENDENT_AMBULATORY_CARE_PROVIDER_SITE_OTHER): Payer: Self-pay | Admitting: Internal Medicine

## 2009-10-16 ENCOUNTER — Encounter (INDEPENDENT_AMBULATORY_CARE_PROVIDER_SITE_OTHER): Payer: Self-pay | Admitting: Internal Medicine

## 2009-10-26 ENCOUNTER — Ambulatory Visit: Payer: Self-pay | Admitting: Oncology

## 2009-10-30 LAB — CBC WITH DIFFERENTIAL/PLATELET
BASO%: 0.8 % (ref 0.0–2.0)
MCHC: 33.8 g/dL (ref 31.5–36.0)
MONO#: 0.4 10*3/uL (ref 0.1–0.9)
RBC: 4.33 10*6/uL (ref 3.70–5.45)
RDW: 13.6 % (ref 11.2–14.5)
WBC: 7.2 10*3/uL (ref 3.9–10.3)
lymph#: 1.7 10*3/uL (ref 0.9–3.3)

## 2009-10-30 LAB — CONVERTED CEMR LAB
ALT: 15 units/L
AST: 17 units/L
Alkaline Phosphatase: 72 units/L
CO2: 23 meq/L
Calcium: 9.8 mg/dL
Glucose, Bld: 151 mg/dL
Sodium: 140 meq/L
Total Bilirubin: 0.8 mg/dL

## 2009-10-31 LAB — COMPREHENSIVE METABOLIC PANEL
ALT: 15 U/L (ref 0–35)
CO2: 23 mEq/L (ref 19–32)
Calcium: 9.8 mg/dL (ref 8.4–10.5)
Chloride: 105 mEq/L (ref 96–112)
Potassium: 4 mEq/L (ref 3.5–5.3)
Sodium: 140 mEq/L (ref 135–145)
Total Bilirubin: 0.8 mg/dL (ref 0.3–1.2)
Total Protein: 6.4 g/dL (ref 6.0–8.3)

## 2009-10-31 LAB — CANCER ANTIGEN 27.29: CA 27.29: 30 U/mL (ref 0–39)

## 2009-11-06 ENCOUNTER — Encounter (INDEPENDENT_AMBULATORY_CARE_PROVIDER_SITE_OTHER): Payer: Self-pay | Admitting: Internal Medicine

## 2009-11-30 ENCOUNTER — Encounter (INDEPENDENT_AMBULATORY_CARE_PROVIDER_SITE_OTHER): Payer: Self-pay | Admitting: Internal Medicine

## 2009-11-30 ENCOUNTER — Ambulatory Visit: Payer: Self-pay | Admitting: Oncology

## 2009-11-30 ENCOUNTER — Telehealth (INDEPENDENT_AMBULATORY_CARE_PROVIDER_SITE_OTHER): Payer: Self-pay | Admitting: Internal Medicine

## 2009-11-30 LAB — CONVERTED CEMR LAB
Bilirubin Urine: NEGATIVE
Glucose, Urine, Semiquant: NEGATIVE
Ketones, urine, test strip: NEGATIVE
Nitrite: NEGATIVE
pH: 6.5

## 2009-12-12 ENCOUNTER — Ambulatory Visit: Payer: Self-pay | Admitting: Oncology

## 2009-12-12 ENCOUNTER — Encounter (INDEPENDENT_AMBULATORY_CARE_PROVIDER_SITE_OTHER): Payer: Self-pay | Admitting: Internal Medicine

## 2009-12-14 ENCOUNTER — Ambulatory Visit: Payer: Self-pay | Admitting: Internal Medicine

## 2009-12-15 ENCOUNTER — Encounter (INDEPENDENT_AMBULATORY_CARE_PROVIDER_SITE_OTHER): Payer: Self-pay | Admitting: Internal Medicine

## 2009-12-29 ENCOUNTER — Ambulatory Visit: Payer: Self-pay | Admitting: Internal Medicine

## 2009-12-29 LAB — CONVERTED CEMR LAB
ALT: 14 units/L (ref 0–35)
Bilirubin Urine: NEGATIVE
CO2: 23 meq/L (ref 19–32)
Cholesterol: 154 mg/dL (ref 0–200)
Creatinine, Ser: 0.85 mg/dL (ref 0.40–1.20)
GC Probe Amp, Genital: NEGATIVE
Glucose, Bld: 116 mg/dL — ABNORMAL HIGH (ref 70–99)
Specific Gravity, Urine: <1.005
Total Bilirubin: 0.7 mg/dL (ref 0.3–1.2)
Total CHOL/HDL Ratio: 2.9
Triglycerides: 77 mg/dL (ref <150)
Urobilinogen, UA: 0.2
VLDL: 15 mg/dL (ref 0–40)
pH: 6.5

## 2010-01-02 ENCOUNTER — Encounter (INDEPENDENT_AMBULATORY_CARE_PROVIDER_SITE_OTHER): Payer: Self-pay | Admitting: Internal Medicine

## 2010-01-03 ENCOUNTER — Encounter (INDEPENDENT_AMBULATORY_CARE_PROVIDER_SITE_OTHER): Payer: Self-pay | Admitting: Internal Medicine

## 2010-01-11 ENCOUNTER — Encounter (INDEPENDENT_AMBULATORY_CARE_PROVIDER_SITE_OTHER): Payer: Self-pay | Admitting: Internal Medicine

## 2010-01-15 ENCOUNTER — Telehealth (INDEPENDENT_AMBULATORY_CARE_PROVIDER_SITE_OTHER): Payer: Self-pay | Admitting: Internal Medicine

## 2010-01-22 ENCOUNTER — Telehealth (INDEPENDENT_AMBULATORY_CARE_PROVIDER_SITE_OTHER): Payer: Self-pay | Admitting: Internal Medicine

## 2010-01-23 ENCOUNTER — Telehealth (INDEPENDENT_AMBULATORY_CARE_PROVIDER_SITE_OTHER): Payer: Self-pay | Admitting: Internal Medicine

## 2010-01-24 ENCOUNTER — Ambulatory Visit: Payer: Self-pay | Admitting: Physician Assistant

## 2010-01-24 LAB — CONVERTED CEMR LAB
Blood in Urine, dipstick: NEGATIVE
Ketones, urine, test strip: NEGATIVE
Nitrite: NEGATIVE
Protein, U semiquant: NEGATIVE
Urobilinogen, UA: 0.2

## 2010-01-26 ENCOUNTER — Ambulatory Visit: Payer: Self-pay | Admitting: Oncology

## 2010-01-30 LAB — CONVERTED CEMR LAB
Albumin: 4.4 g/dL
Alkaline Phosphatase: 81 units/L
BUN: 12 mg/dL
CO2: 22 meq/L
Calcium: 10.2 mg/dL
Creatinine, Ser: 0.88 mg/dL
Sodium: 138 meq/L
Total Protein: 6.9 g/dL

## 2010-01-30 LAB — CBC WITH DIFFERENTIAL/PLATELET
EOS%: 2.6 % (ref 0.0–7.0)
Eosinophils Absolute: 0.2 10*3/uL (ref 0.0–0.5)
LYMPH%: 28.8 % (ref 14.0–49.7)
MCH: 32.3 pg (ref 25.1–34.0)
MCHC: 34.3 g/dL (ref 31.5–36.0)
MCV: 94.3 fL (ref 79.5–101.0)
MONO%: 11.9 % (ref 0.0–14.0)
Platelets: 295 10*3/uL (ref 145–400)
RBC: 4.57 10*6/uL (ref 3.70–5.45)
RDW: 13.6 % (ref 11.2–14.5)

## 2010-01-30 LAB — COMPREHENSIVE METABOLIC PANEL
AST: 22 U/L (ref 0–37)
Albumin: 4.6 g/dL (ref 3.5–5.2)
Alkaline Phosphatase: 81 U/L (ref 39–117)
Glucose, Bld: 98 mg/dL (ref 70–99)
Potassium: 4.3 mEq/L (ref 3.5–5.3)
Sodium: 138 mEq/L (ref 135–145)
Total Bilirubin: 1.2 mg/dL (ref 0.3–1.2)
Total Protein: 6.9 g/dL (ref 6.0–8.3)

## 2010-02-06 ENCOUNTER — Encounter (INDEPENDENT_AMBULATORY_CARE_PROVIDER_SITE_OTHER): Payer: Self-pay | Admitting: Internal Medicine

## 2010-02-07 ENCOUNTER — Ambulatory Visit (HOSPITAL_COMMUNITY): Admission: RE | Admit: 2010-02-07 | Discharge: 2010-02-07 | Payer: Self-pay | Admitting: Oncology

## 2010-03-11 ENCOUNTER — Encounter: Payer: Self-pay | Admitting: Internal Medicine

## 2010-03-11 ENCOUNTER — Emergency Department (HOSPITAL_COMMUNITY): Admission: EM | Admit: 2010-03-11 | Discharge: 2010-03-11 | Payer: Self-pay | Admitting: Family Medicine

## 2010-03-11 LAB — CONVERTED CEMR LAB
HCT: 47 %
Hemoglobin: 16 g/dL
Potassium: 3.8 meq/L
Sodium: 139 meq/L

## 2010-04-05 ENCOUNTER — Encounter: Admission: RE | Admit: 2010-04-05 | Discharge: 2010-04-05 | Payer: Self-pay | Admitting: Oncology

## 2010-04-11 ENCOUNTER — Ambulatory Visit: Payer: Self-pay | Admitting: Oncology

## 2010-04-12 ENCOUNTER — Encounter: Payer: Self-pay | Admitting: Internal Medicine

## 2010-04-12 LAB — CONVERTED CEMR LAB
AST: 16 units/L
Alkaline Phosphatase: 81 units/L
Basophils Relative: 0.5 %
CO2: 22 meq/L
HCT: 43.1 %
Neutrophils Relative %: 55.7 %
Platelets: 253 10*3/uL
RBC: 4.56 M/uL
RDW: 14.1 %
Total Bilirubin: 0.9 mg/dL
WBC: 8.1 10*3/uL

## 2010-04-12 LAB — CBC WITH DIFFERENTIAL/PLATELET
BASO%: 0.5 % (ref 0.0–2.0)
Basophils Absolute: 0 10*3/uL (ref 0.0–0.1)
EOS%: 2.8 % (ref 0.0–7.0)
HGB: 14.5 g/dL (ref 11.6–15.9)
MCH: 31.7 pg (ref 25.1–34.0)
MONO%: 10.4 % (ref 0.0–14.0)
RBC: 4.56 10*6/uL (ref 3.70–5.45)
RDW: 14.1 % (ref 11.2–14.5)
lymph#: 2.5 10*3/uL (ref 0.9–3.3)

## 2010-04-12 LAB — COMPREHENSIVE METABOLIC PANEL
ALT: 14 U/L (ref 0–35)
AST: 16 U/L (ref 0–37)
Albumin: 4.4 g/dL (ref 3.5–5.2)
Alkaline Phosphatase: 81 U/L (ref 39–117)
BUN: 9 mg/dL (ref 6–23)
Calcium: 9.5 mg/dL (ref 8.4–10.5)
Chloride: 103 mEq/L (ref 96–112)
Potassium: 3.9 mEq/L (ref 3.5–5.3)
Sodium: 137 mEq/L (ref 135–145)
Total Protein: 6.7 g/dL (ref 6.0–8.3)

## 2010-04-18 ENCOUNTER — Encounter (INDEPENDENT_AMBULATORY_CARE_PROVIDER_SITE_OTHER): Payer: Self-pay | Admitting: Internal Medicine

## 2010-05-02 ENCOUNTER — Encounter: Payer: Self-pay | Admitting: Internal Medicine

## 2010-05-03 ENCOUNTER — Ambulatory Visit: Payer: Self-pay | Admitting: Internal Medicine

## 2010-05-03 DIAGNOSIS — F17200 Nicotine dependence, unspecified, uncomplicated: Secondary | ICD-10-CM | POA: Insufficient documentation

## 2010-05-03 DIAGNOSIS — C50919 Malignant neoplasm of unspecified site of unspecified female breast: Secondary | ICD-10-CM

## 2010-05-03 DIAGNOSIS — Z860100 Personal history of colon polyps, unspecified: Secondary | ICD-10-CM | POA: Insufficient documentation

## 2010-05-03 DIAGNOSIS — H9190 Unspecified hearing loss, unspecified ear: Secondary | ICD-10-CM | POA: Insufficient documentation

## 2010-05-03 DIAGNOSIS — M129 Arthropathy, unspecified: Secondary | ICD-10-CM | POA: Insufficient documentation

## 2010-05-03 DIAGNOSIS — R32 Unspecified urinary incontinence: Secondary | ICD-10-CM | POA: Insufficient documentation

## 2010-05-03 HISTORY — DX: Unspecified hearing loss, unspecified ear: H91.90

## 2010-05-11 ENCOUNTER — Encounter: Payer: Self-pay | Admitting: Internal Medicine

## 2010-06-27 ENCOUNTER — Telehealth: Payer: Self-pay | Admitting: Internal Medicine

## 2010-06-28 ENCOUNTER — Telehealth: Payer: Self-pay | Admitting: Internal Medicine

## 2010-07-10 ENCOUNTER — Ambulatory Visit: Payer: Self-pay | Admitting: Oncology

## 2010-07-12 ENCOUNTER — Encounter: Payer: Self-pay | Admitting: Internal Medicine

## 2010-07-12 LAB — CBC WITH DIFFERENTIAL/PLATELET
Eosinophils Absolute: 0.3 10*3/uL (ref 0.0–0.5)
HCT: 42.3 % (ref 34.8–46.6)
HGB: 14.8 g/dL (ref 11.6–15.9)
LYMPH%: 23.5 % (ref 14.0–49.7)
MONO#: 0.8 10*3/uL (ref 0.1–0.9)
NEUT#: 5.4 10*3/uL (ref 1.5–6.5)
NEUT%: 62.2 % (ref 38.4–76.8)
Platelets: 237 10*3/uL (ref 145–400)
WBC: 8.7 10*3/uL (ref 3.9–10.3)
lymph#: 2 10*3/uL (ref 0.9–3.3)

## 2010-07-12 LAB — CONVERTED CEMR LAB
AST: 15 units/L
Albumin: 4.3 g/dL
Alkaline Phosphatase: 79 units/L
CO2: 21 meq/L
Creatinine, Ser: 0.77 mg/dL
Lymphocytes, automated: 23.5 %
MCV: 92.3 fL
Neutrophils Relative %: 62.2 %
Platelets: 237 10*3/uL
Potassium: 4.2 meq/L
RBC: 4.59 M/uL
RDW: 14.3 %
Sodium: 138 meq/L

## 2010-07-13 LAB — VITAMIN D 25 HYDROXY (VIT D DEFICIENCY, FRACTURES): Vit D, 25-Hydroxy: 52 ng/mL (ref 30–89)

## 2010-07-13 LAB — COMPREHENSIVE METABOLIC PANEL
CO2: 21 mEq/L (ref 19–32)
Calcium: 9.8 mg/dL (ref 8.4–10.5)
Chloride: 106 mEq/L (ref 96–112)
Creatinine, Ser: 0.77 mg/dL (ref 0.40–1.20)
Glucose, Bld: 110 mg/dL — ABNORMAL HIGH (ref 70–99)
Sodium: 138 mEq/L (ref 135–145)
Total Bilirubin: 0.8 mg/dL (ref 0.3–1.2)
Total Protein: 6.8 g/dL (ref 6.0–8.3)

## 2010-07-13 LAB — CANCER ANTIGEN 27.29: CA 27.29: 29 U/mL (ref 0–39)

## 2010-07-26 ENCOUNTER — Ambulatory Visit (HOSPITAL_COMMUNITY): Admission: RE | Admit: 2010-07-26 | Discharge: 2010-07-26 | Payer: Self-pay | Admitting: Oncology

## 2010-07-26 ENCOUNTER — Ambulatory Visit: Payer: Self-pay | Admitting: Internal Medicine

## 2010-08-01 ENCOUNTER — Encounter: Payer: Self-pay | Admitting: Internal Medicine

## 2010-08-01 ENCOUNTER — Ambulatory Visit (HOSPITAL_COMMUNITY): Admission: RE | Admit: 2010-08-01 | Discharge: 2010-08-01 | Payer: Self-pay | Admitting: Internal Medicine

## 2010-08-01 ENCOUNTER — Ambulatory Visit: Payer: Self-pay | Admitting: Vascular Surgery

## 2010-08-02 ENCOUNTER — Telehealth: Payer: Self-pay | Admitting: Internal Medicine

## 2010-10-16 ENCOUNTER — Ambulatory Visit (HOSPITAL_BASED_OUTPATIENT_CLINIC_OR_DEPARTMENT_OTHER): Payer: Medicare Other | Admitting: Oncology

## 2010-10-18 LAB — CBC WITH DIFFERENTIAL/PLATELET
Basophils Absolute: 0.1 10*3/uL (ref 0.0–0.1)
EOS%: 3.9 % (ref 0.0–7.0)
Eosinophils Absolute: 0.3 10*3/uL (ref 0.0–0.5)
HGB: 14.3 g/dL (ref 11.6–15.9)
LYMPH%: 26.5 % (ref 14.0–49.7)
MCH: 31.7 pg (ref 25.1–34.0)
MCV: 94.7 fL (ref 79.5–101.0)
MONO%: 7.6 % (ref 0.0–14.0)
NEUT#: 5.2 10*3/uL (ref 1.5–6.5)
Platelets: 271 10*3/uL (ref 145–400)

## 2010-10-19 LAB — COMPREHENSIVE METABOLIC PANEL
AST: 13 U/L (ref 0–37)
Alkaline Phosphatase: 85 U/L (ref 39–117)
BUN: 10 mg/dL (ref 6–23)
Creatinine, Ser: 0.83 mg/dL (ref 0.40–1.20)
Glucose, Bld: 157 mg/dL — ABNORMAL HIGH (ref 70–99)
Total Bilirubin: 0.5 mg/dL (ref 0.3–1.2)

## 2010-10-25 ENCOUNTER — Encounter: Payer: Self-pay | Admitting: Internal Medicine

## 2010-11-09 ENCOUNTER — Emergency Department (HOSPITAL_COMMUNITY)
Admission: EM | Admit: 2010-11-09 | Discharge: 2010-11-09 | Payer: Self-pay | Source: Home / Self Care | Attending: Family Medicine | Admitting: Family Medicine

## 2010-12-12 ENCOUNTER — Ambulatory Visit (HOSPITAL_COMMUNITY)
Admission: RE | Admit: 2010-12-12 | Discharge: 2010-12-12 | Payer: Self-pay | Source: Home / Self Care | Attending: Internal Medicine | Admitting: Internal Medicine

## 2011-01-08 NOTE — Progress Notes (Signed)
Summary: Rx refill req  Phone Note Refill Request Message from:  Patient on June 27, 2010 9:17 AM  Refills Requested: Medication #1:  AVAPRO 150 MG  TABS 1 tab by mouth daily   Dosage confirmed as above?Dosage Confirmed   Supply Requested: 6 months  Medication #2:  LEVOTHROID 88 MCG TABS 1 tab by mouth daily   Dosage confirmed as above?Dosage Confirmed   Supply Requested: 6 months  Medication #3:  BONIVA 150 MG TABS TAKE ONE (1) TABLET ONCE A MONTH   Dosage confirmed as above?Dosage Confirmed   Supply Requested: 6 months  Medication #4:  FLONASE 50 MCG/ACT SUSP 2 sprays each nostril daily   Dosage confirmed as above?Dosage Confirmed   Supply Requested: 6 months Nitrofuriton   Method Requested: Electronic Initial call taken by: Margaret Pyle, CMA,  June 27, 2010 9:22 AM    Prescriptions: FLONASE 50 MCG/ACT SUSP (FLUTICASONE PROPIONATE) 2 sprays each nostril daily  #3 x 1   Entered by:   Margaret Pyle, CMA   Authorized by:   Newt Lukes MD   Signed by:   Margaret Pyle, CMA on 06/27/2010   Method used:   Electronically to        [facility] [address] (retail)       [phone]       [place], [place]  [address]       Ph: [phone]       Fax: [phone]   RxID:   [id number] NITROFURANTOIN MACROCRYSTAL 100 MG CAPS (NITROFURANTOIN MACROCRYSTAL) take 1 by mouth once daily as needed  #90 x 1   Entered by:   Margaret Pyle, CMA   Authorized by:   Newt Lukes MD   Signed by:   Margaret Pyle, CMA on 06/27/2010   Method used:   Electronically to        [facility] [address] (retail)       [phone]       [place], [place]  [address]       Ph: [phone]       Fax: [phone]   RxID:   [id number] BONIVA 150 MG TABS (IBANDRONATE SODIUM) TAKE ONE (1) TABLET ONCE A MONTH  #3 x 1   Entered by:   Margaret Pyle, CMA  Authorized by:   Newt Lukes MD   Signed by:   Margaret Pyle, CMA on 06/27/2010   Method used:   Electronically to        [facility] [address] (retail)       [phone]       [place], [place]  [address]       Ph: [phone]       Fax: [phone]   RxID:   [id number] LEVOTHROID 52 MCG TABS (LEVOTHYROXINE SODIUM) 1 tab by mouth daily  #90 x 1   Entered by:   Margaret Pyle, CMA   Authorized by:   Newt Lukes MD   Signed by:   Margaret Pyle, CMA on 06/27/2010   Method used:   Electronically to        [facility] [address] (retail)       [phone]       [place], [place]  [address]  Ph: [phone]       Fax: [phone]   RxID:   [id number] AVAPRO 150 MG  TABS (IRBESARTAN) 1 tab by mouth daily  #90 x 1   Entered by:   Margaret Pyle, CMA   Authorized by:   Newt Lukes MD   Signed by:   Margaret Pyle, CMA on 06/27/2010   Method used:   Electronically to        [facility] [address] (retail)       [phone]       [place], [place]  [address]       Ph: [phone]       Fax: [phone]   RxID:   [id number]

## 2011-01-08 NOTE — Progress Notes (Signed)
Summary: verify quanity  Phone Note From Pharmacy   Caller: [facility] [address] Summary of Call: Recieved e-script rx for flonase. Need to verify which quanity. It comes in 16 gm. Faxed back paper request should be 16gm with 1 addtional refills. Initial call taken by: Orlan Leavens,  June 28, 2010 10:19 AM

## 2011-01-08 NOTE — Assessment & Plan Note (Signed)
Summary: NEW MEDICARE /STATES SHE HAS MEDICARE/NWS /PT-PKG-STC   Vital Signs:  Patient profile:   65 year old female Height:      65 inches (165.10 cm) Weight:      151.0 pounds (68.64 kg) O2 Sat:      90 % on Room air Temp:     97.3 degrees F (36.28 degrees C) oral Pulse rate:   86 / minute BP sitting:   130 / 80  (right arm) Cuff size:   regular  Vitals Entered By: Orlan Leavens (May 03, 2010 9:43 AM)  O2 Flow:  Room air CC: New patient Is Patient Diabetic? No Pain Assessment Patient in pain? no        Primary Care Provider:  Newt Lukes MD  CC:  New patient.  History of Present Illness: new pt to me and our practice, here to est care - [facility] - EMR records reviewed -  1) HTN - reports compliance with ongoing medical treatment and no changes in medication dose or frequency. denies adverse side effects related to current therapy. no CP, HA or edema  2) hx breast cancer - follows with onc for same ([facility]) - s/p lumpectomt and xrt, then antiest thru 09/2008  3) hypothryoid - reports compliance with ongoing medical treatment; last changes in medication dose or frequency 01/2010. denies adverse side effects related to current therapy. no weight or skin changes, less fatigue  4) dyslipidemia - reports noncompliance with prescribed medical treatment. concerned about adverse side effects related to prior therapy. also dmit to no change in fatigue and foot pain symptoms since stopping lipitor tx  5) COPD - ongoing continued tobacco, has tried chantix x 2 weeks without success - +DOE and cough with chronic sinus and ear congestion - unresponsive to antihits and nasal steroids - - no sputum or hemopytsis -    Preventive Screening-Counseling & Management  Alcohol-Tobacco     Alcohol drinks/day: 3     Alcohol type: beer     Alcohol Counseling: to decrease amount and/or frequency of alcohol intake     Smoking Status: current     Smoking  Cessation Counseling: yes     Packs/Day: 0.75     Year Started: age 21     Tobacco Counseling: to quit use of tobacco products  Caffeine-Diet-Exercise     Does Patient Exercise: no     Depression Counseling: not indicated; screening negative for depression  Safety-Violence-Falls     Seat Belt Counseling: not indicated; patient wears seat belts     Helmet Counseling: to wear helmet when rides bicycle/motorcycle     Firearms in the Home: no firearms in the home     Firearm Counseling: not indicated; uses recommended firearm safety measures     Violence in the Home: no risk noted     Fall Risk Counseling: not indicated; no significant falls noted  Clinical Review Panels:  Prevention   Last Mammogram:  [facility].   No specific mammographic evidence of malignancy.  Assessment: BIRADS 2. (04/05/2010)   Last Pap Smear:  NEGATIVE FOR INTRAEPITHELIAL LESIONS OR MALIGNANCY. (12/29/2009)   Last Colonoscopy:  4 polyps--3 sent for path--all hyperplastic.  Dr. Baird Lyons, however, recommends repeat colonoscopy in 3 years. (07/12/2009)  Immunizations   Last Tetanus Booster:  Tdap (07/07/2008)   Last Flu Vaccine:  Fluvax 3+ (09/07/2009)   Last Pneumovax:  Pneumovax (09/25/2007)  Lipid Management   Cholesterol:  154 (12/29/2009)  LDL (bad choesterol):  85 (12/29/2009)   HDL (good cholesterol):  54 (12/29/2009)  CBC   WBC:  8.1 (04/12/2010)   RBC:  4.56 (04/12/2010)   Hgb:  14.5 (04/12/2010)   Hct:  43.1 (04/12/2010)   Platelets:  253 (04/12/2010)   MCV  94.5 (04/12/2010)   MCHC  32.4 (09/07/2009)   RDW  14.1 (04/12/2010)   PMN:  55.7 (04/12/2010)   Lymphs:  22 (09/07/2009)   Monos:  10.4 (04/12/2010)   Eosinophils:  2.8 (04/12/2010)   Basophil:  0.5 (04/12/2010)  Complete Metabolic Panel   Glucose:  93 (04/12/2010)   Sodium:  137 (04/12/2010)   Potassium:  3.9 (04/12/2010)   Chloride:  103 (04/12/2010)   CO2:  22 (04/12/2010)   BUN:  9  (04/12/2010)   Creatinine:  0.73 (04/12/2010)   Albumin:  4.4 (04/12/2010)   Total Protein:  6.7 (04/12/2010)   Calcium:  9.5 (04/12/2010)   Total Bili:  0.9 (04/12/2010)   Alk Phos:  81 (04/12/2010)   SGPT (ALT):  14 (04/12/2010)   SGOT (AST):  16 (04/12/2010)   -  Date:  04/12/2010    BG Random: 93    BUN: 9    Creatinine: 0.73    Sodium: 137    Potassium: 3.9    Chloride: 103    CO2 Total: 22    SGOT (AST): 16    SGPT (ALT): 14    T. Bilirubin: 0.9    Alk Phos: 81    Calcium: 9.5    Total Protein: 6.7    Albumin: 4.4    WBC: 8.1    HGB: 14.5    HCT: 43.1    RBC: 4.56    PLT: 253    MCV: 94.5    RDW: 14.1    Neutrophil: 55.7    Lymphs: 30.6    Monos: 10.4    Eos: 2.8    Basophil: 0.5  Date:  01/30/2010    CO2 Total: 22    SGOT (AST): 22    SGPT (ALT): 26    T. Bilirubin: 0.9    Alk Phos: 81    Calcium: 10.2    Total Protein: 6.9    Albumin: 4.4  Current Medications (verified): 1)  Avapro 150 Mg  Tabs (Irbesartan) .Marland Kitchen.. 1 Tab By Mouth Daily 2)  Levothroid 88 Mcg Tabs (Levothyroxine Sodium) .Marland Kitchen.. 1 Tab By Mouth Daily 3)  Boniva 150 Mg Tabs (Ibandronate Sodium) .... Take One (1) Tablet Once A Month 4)  Nexium 40 Mg Cpdr (Esomeprazole Magnesium) .... Take One (1) Capsule Once Daily 5)  Flonase 50 Mcg/act Susp (Fluticasone Propionate) .... 2 Sprays Each Nostril Daily 6)  Nitrofurantoin Macrocrystal 100 Mg Caps (Nitrofurantoin Macrocrystal) .... Take 1 By Mouth Once Daily As Needed  Allergies (verified): 1)  Levaquin 2)  Codeine  Past History:  Past Medical History: COPD HYPERLIPIDEMIA HYPERTENSION HYPOTHYROIDISM ANXIETY DEPRESSION OSTEOPENIA  HYPERTENSION CARCINOMA, BREAST, HX OF - left lumpectomy 01/2006 s/p XRT GERD  Allergic rhinitis Urinary incontinence   MD rooster: onc - [facility]  Past Surgical History: 1.  Age 48:  Benign right breast biopsy 2.  2/07:  Left breast lumpectomy--Dr. Earlene Plater, followed by Dr.  Darnelle Catalan 3.  2007:  Cardiac Cath:  normal.  Dr. Sharyn Lull 4.  09/20/2009:  Right knee arthroscopic surgery:  debridement and anterolateral meniscectomy:  Dr. Timothy Lasso Dr. Hulan Fray at [facility].  Family History: Mother, died 7:  stroke, breast cancer, depression and anxiety--undiagnosed  per patient Father, died 7s:  stroke Sister, died 31:  Breast Cancer--19 years 3 other Sisters:  Hypertension, depression/ anxiety, one sister with brain aneurysm Brother:  Hx of abdominal aortic aneurysm. 5 Daughters:  healthy    Social History: Divorced. Works at [employer] alone. No significant otherPacks/Day:  0.75  Review of Systems       complains of bilateral feet pain x 3 months - not numb, no swelling - has upcoming evaluation with neuro for same. otherwise, see HPI above. I have reviewed all other systems and they were negative.   Physical Exam  General:  alert, well-developed, well-nourished, and cooperative to examination.   speech quality of one with hearing loss Eyes:  vision grossly intact; pupils equal, round and reactive to light.  conjunctiva and lids normal.    Ears:  normal pinnae bilaterally, without erythema, swelling, or tenderness to palpation. TMs hazy, without effusion, or cerumen impaction. Hearing grossly diminshed L>R side Mouth:  teeth and gums in good repair; mucous membranes moist, without lesions or ulcers. oropharynx clear without exudate, erythema.  Neck:  supple, full ROM, no masses, no thyromegaly; no thyroid nodules or tenderness. no JVD or carotid bruits.   Lungs:  normal respiratory effort, no intercostal retractions or use of accessory muscles; normal breath sounds bilaterally - no crackles and no wheezes.    Heart:  normal rate, regular rhythm, no murmur, and no rub. BLE without edema.  Abdomen:  soft, non-tender, normal bowel sounds, no distention; no masses and no appreciable hepatomegaly or splenomegaly.   Msk:  No deformity or  scoliosis noted of thoracic or lumbar spine.   Neurologic:  alert & oriented X3 and cranial nerves II-XII symetrically intact.  strength normal in all extremities, sensation intact to light touch, and gait normal. speech fluent without dysarthria or aphasia; follows commands with good comprehension.  Skin:  no rashes, vesicles, ulcers, or erythema. No nodules or irregularity to palpation.  Psych:  Oriented X3, memory intact for recent and remote, normally interactive, good eye contact, not anxious appearing, not depressed appearing, and not agitated.      Impression & Recommendations:  Problem # 1:  COPD (ICD-496)  stongly advised to quit smoking - counseled on same, see below - low normal O2, likely contrib to dyspnea and fatigue - arrange for PFTs and conider need for pulm meds to help symptoms  Orders: Misc. Referral (Misc. Ref) Tobacco use cessation intermediate 3-10 minutes ([address])  Pulmonary Functions Reviewed: O2 sat: 90 (05/03/2010)     Vaccines Reviewed: Pneumovax: Pneumovax (09/25/2007)   Flu Vax: Fluvax 3+ (09/07/2009)  Problem # 2:  SMOKER (ICD-305.1) 5 minutes today spent on patient education regarding the unhealthy effects of continued tobacco abuse and encouragment of cessation including medical options available to help patient to quit smoking.  Orders: Tobacco use cessation intermediate 3-10 minutes (99406) Misc. Referral (Misc. Ref)  Problem # 3:  HYPERLIPIDEMIA (ICD-272.4)  advised to resume statin at which point, pt reports 04/17/10 episode of transient vison loss in left eye - , then normal return sight. arrange carotid u/s r/o ICS stenosis - no  bruit on exam The following medications were removed from the medication list:    Lipitor 40 Mg Tabs (Atorvastatin calcium) .Marland Kitchen... 1 tab by mouth daily Her updated medication list for this problem includes:    Lipitor 40 Mg Tabs (Atorvastatin calcium) .Marland Kitchen... 1 by mouth once daily  Orders: Prescription Created  Electronically [id number]) Tobacco use  cessation intermediate 3-10 minutes (99406) Misc. Referral (Misc. Ref)  Labs Reviewed: SGOT: 19 (12/29/2009)   SGPT: 14 (12/29/2009)  Prior 10 Yr Risk Heart Disease: 9 % (10/28/2008)   HDL:54 (12/29/2009), 50 (07/07/2008)  LDL:85 (12/29/2009), 76 (07/07/2008)  Chol:154 (12/29/2009), 143 (07/07/2008)  Trig:77 (12/29/2009), 84 (07/07/2008)  Problem # 4:  HYPERTENSION (ICD-401.9)  Her updated medication list for this problem includes:    Avapro 150 Mg Tabs (Irbesartan) .Marland Kitchen... 1 tab by mouth daily  Orders: Misc. Referral (Misc. Ref)  BP today: 130/80 Prior BP: 142/82 (12/29/2009)  Prior 10 Yr Risk Heart Disease: 9 % (10/28/2008)  Labs Reviewed: K+: 3.8 (03/11/2010) Creat: : 0.8 (03/11/2010)   Chol: 154 (12/29/2009)   HDL: 54 (12/29/2009)   LDL: 85 (12/29/2009)   TG: 77 (12/29/2009)  Problem # 5:  HYPOTHYROIDISM (ICD-244.9)  Her updated medication list for this problem includes:    Levothroid 88 Mcg Tabs (Levothyroxine sodium) .Marland Kitchen... 1 tab by mouth daily  Labs Reviewed: TSH: 6.262 (12/29/2009)    Chol: 154 (12/29/2009)   HDL: 54 (12/29/2009)   LDL: 85 (12/29/2009)   TG: 77 (12/29/2009)  Problem # 6:  ALLERGIC RHINITIS (ICD-477.9)  Her updated medication list for this problem includes:    Flonase 50 Mcg/act Susp (Fluticasone propionate) .Marland Kitchen... 2 sprays each nostril daily  Discussed use of allergy medications and environmental measures.   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 1 tab by mouth daily 2)  Levothroid 88 Mcg Tabs (Levothyroxine sodium) .Marland Kitchen.. 1  tab by mouth daily 3)  Boniva 150 Mg Tabs (Ibandronate sodium) .... Take one (1) tablet once a month 4)  Nexium 40 Mg Cpdr (Esomeprazole magnesium) .... Take one (1) capsule once daily 5)  Flonase 50 Mcg/act Susp (Fluticasone propionate) .... 2 sprays each nostril daily 6)  Nitrofurantoin Macrocrystal 100 Mg Caps (Nitrofurantoin macrocrystal) .... Take 1 by mouth once daily as needed 7)  Lipitor 40 Mg Tabs (Atorvastatin calcium) .Marland Kitchen.. 1 by mouth once daily  Patient Instructions: 1)  it was good to see you today.  2)  we'll make referral for PFTs to evaluate breathing, hearing evaluation at [facility] and carotid ultrasound to evaluate blood flow in your neck . Our office will contact you regarding these appointments once made.  3)  Tobacco is very bad for your health and your loved ones! You Should stop smoking! You will feel better if you can quit! 4)  no medications changes (resume Lipitor as discussed) - refills done 5)  Please schedule a follow-up appointment in 3 months, sooner if problems.  Prescriptions: LIPITOR 40 MG TABS (ATORVASTATIN CALCIUM) 1 by mouth once daily  #30 x 5   Entered and Authorized by:   Newt Lukes MD   Signed by:   Newt Lukes MD on 05/03/2010   Method used:   Electronically to        [facility] [address] (retail)       [phone]       [place], [place]  [address]  Ph: [phone]       Fax: [phone]   RxID:   [id number]

## 2011-01-08 NOTE — Letter (Signed)
Summary: [facility]  [facility]   Imported By: Arta Bruce 11/08/2010 16:07:24  _____________________________________________________________________  External Attachment:    Type:   Image     Comment:   External Document

## 2011-01-08 NOTE — Letter (Signed)
Summary: Lipid Letter  [facility]  [address] [place], [place] [address]   Phone: [phone]  Fax: [phone]    01/11/2010  Michaela Guerra [address] [place], [place]  [address]  Dear Ms. Eltzroth:  We have carefully reviewed your last lipid profile from 12/29/2009 and the results are noted below with a summary of recommendations for lipid management.    Cholesterol:       154     Goal: <200   HDL "good" Cholesterol:   54     Goal: >45   LDL "bad" Cholesterol:   85     Goal: <100   Triglycerides:       77     Goal: <150    Cholesterol is quite good.  The rest of labs except for your thyroid testing were fine as well.  It looks like you may be missing your thyroid med occasionally--have you?  Please let me know in next 2 weeks after receiving this letter.  Pap was fine as well.    TLC Diet (Therapeutic Lifestyle Change): Saturated Fats & Transfatty acids should be kept < 7% of total calories ***Reduce Saturated Fats Polyunstaurated Fat can be up to 10% of total calories Monounsaturated Fat Fat can be up to 20% of total calories Total Fat should be no greater than 25-35% of total calories Carbohydrates should be 50-60% of total calories Protein should be approximately 15% of total calories Fiber should be at least 20-30 grams a day ***Increased fiber may help lower LDL Total Cholesterol should be < 200mg /day Consider adding plant stanol/sterols to diet (example: Benacol spread) ***A higher intake of unsaturated fat may reduce Triglycerides and Increase HDL    Adjunctive Measures (may lower LIPIDS and reduce risk of Heart Attack) include: Aerobic Exercise (20-30 minutes 3-4 times a week) Limit Alcohol Consumption Weight Reduction Aspirin 75-81 mg a day by mouth (if not allergic or contraindicated) Dietary Fiber 20-30 grams a day by mouth     Current Medications: 1)    Avapro 150 Mg  Tabs (Irbesartan) .Marland Kitchen.. 1 tab by mouth daily 2)    Levothroid 75 Mcg   Tabs (Levothyroxine sodium) .Marland Kitchen.. 1 tab by mouth daily 3)    Boniva 150 Mg Tabs (Ibandronate sodium) .... Take one (1) tablet once a month 4)    Nexium 40 Mg Cpdr (Esomeprazole magnesium) .... Take one (1) capsule once daily 5)    Bayer Aspirin Ec Low Dose 81 Mg  Tbec (Aspirin) .Marland Kitchen.. 1 tab once daily 6)    Lipitor 40 Mg  Tabs (Atorvastatin calcium) .Marland Kitchen.. 1 tab by mouth daily 7)    Alprazolam 0.5 Mg  Tabs (Alprazolam) .... 1/2 tab by mouth in morning 8)    Triamcinolone Acetonide 0.1 % Crea (Triamcinolone acetonide) .... Apply two times a day to affected area prn 9)    Flonase 50 Mcg/act Susp (Fluticasone propionate) .... 2 sprays each nostril daily 10)    Cipro 500 Mg Tabs (Ciprofloxacin hcl) .Marland Kitchen.. 1 tab by mouth two times a day for 10 days 11)    Wellbutrin Sr 150 Mg Xr12h-tab (Bupropion hcl) .Marland Kitchen.. 1 tab by mouth every morning for 3 days, then 1 tab by mouth two times a day  If you have any questions, please call. We appreciate being able to work with you.   Sincerely,    [facility] Julieanne Manson MD

## 2011-01-08 NOTE — Progress Notes (Signed)
  Phone Note Call from Patient Call back at [facility] Phone [phone]   Summary of Call: Pt came off from xanax and she still want to have a report from all the test she had from her physical.  She also wants to appreciate all the help that she receive from Dr Delrae Alfred during all these years.  Thank you for always being so nice. Damire Remedios MD Initial call taken by: Manon Hilding,  January 15, 2010 8:51 AM  Follow-up for Phone Call        Pt should be getting letter in the mail soon. Follow-up by: Vesta Mixer CMA,  January 16, 2010 5:08 PM

## 2011-01-08 NOTE — Assessment & Plan Note (Signed)
Summary: FOLLOW UP WITH DR Estephani Popper IN 4 MONTHS/ CPP EXAM//GK   Vital Signs:  Patient profile:   65 year old female Weight:      152.7 pounds BMI:     26.31 Temp:     98.0 degrees F Pulse rate:   84 / minute Pulse rhythm:   regular Resp:     18 per minute BP sitting:   142 / 82  (right arm) Cuff size:   regular  Vitals Entered By: Vesta Mixer CMA (December 29, 2009 8:59 AM) CC: CPP Is Patient Diabetic? No  Does patient need assistance? Ambulation Normal   CC:  CPP.  History of Present Illness: 65 yo female here for CPP.  1.  Recurrent UTIs:  has appt. with urologist on 1/26.  Taking Cipro only once daily as bothering stomach for recent UTI.  Has about 6 days left.  2.  Depression:  received two times a day dosing of Alprazolam from [facility]c around Thanksgiving for about 3 weeks.  If runs out of Alprazolam, drinks alcohol.  Drinks 1-3 beers nightly.  Has been on Paxil and Zoloft with side effects previously--increasing depression and wild nightmares.  No hx of seizures.  Habits & Providers  Alcohol-Tobacco-Diet     Alcohol drinks/day: 3     Alcohol Counseling: to STOP drinking     Alcohol type: beer     Tobacco Status: current     Cigarette Packs/Day: 0.5     Year Started: age 9  Exercise-Depression-Behavior     Drug Use: never  Allergies: 1)  Levaquin 2)  Codeine  Past History:  Past Medical History: Reviewed history from 07/07/2008 and no changes required. HYPOTHYROIDISM (ICD-244.9) CONCERN FORSWELLING, MASS, OR LUMP IN CHEST (ICD-786.6) ANXIETY (ICD-300.00) DEPRESSION (ICD-311) ALCOHOL ABUSE (ICD-305.00) OSTEOPENIA (ICD-733.90) HYPERTENSION (ICD-401.9) CARCINOMA, BREAST, HX OF (ICD-V10.3) ANEMIA, IRON DEFICIENCY NOS (ICD-280.9) SINUSITIS, ACUTE (ICD-461.9) HYPERLIPIDEMIA (ICD-272.4) GERD (ICD-530.81) COPD (ICD-496)  Past Surgical History: 1.  Age 68:  Benign right breast biopsy. 2.  2/07:  Left breast lumpectomy--Dr. Earlene Plater, followed by Dr.  Darnelle Catalan 3.  2007:  Cardiac Cath:  normal.  Dr. Sharyn Lull 4.  09/20/2009:  Right knee arthroscopic surgery:  debridement and anterolateral meniscectomy:  Dr. Timothy Lasso Dr. Hulan Fray at [facility].  Family History: Reviewed history from 07/07/2008 and no changes required. Mother, died 74:  stroke, breast cancer, depression and anxiety--undiagnosed per patient Father, died 29s:  stroke Sister, died 79:  Breast Cancer--19 years 3 other Sisters:  Hypertension, depression/ anxiety, one sister with brain aneurysm Brother:  Hx of abdominal aortic aneurysm. 5 Daughters:  healthy  Social History: Divorced. Works at [employer] alone. No significant otherPacks/Day:  0.5 Drug Use:  never  Review of Systems General:  Energy good--better if not drinking.. Eyes:  May need new glasses.  Needs to have pressures checked--glaucoma runs in family.  Looking into going to [facility].. 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 CV:  Denies palpitations; Did have a cramp in left chest when reaching out to place glasses on coffee table--Breast surgical site became quite sore and pain up neck.  This happened 1 week ago.Marland Kitchen Resp:  Breathing pretty well currently. GI:  Denies bloody stools, constipation, dark tarry stools, and diarrhea; Some suprapubic discomfort with UTI. GU:  Denies discharge; Urinary frequency better now on antibiotics. MS:  Denies joint pain, joint redness, and joint swelling. Derm:  Denies lesion(s); Still with dry patch on right low back--better this winter than  last.. Neuro:  Some numbness in left arm at times associated with neck discomfort. Psych:  Complains of anxiety and depression; denies suicidal thoughts/plans; See HPI.  Physical Exam  General:  Well-developed,well-nourished,in no acute distress; alert,appropriate and cooperative throughout examination Head:  Normocephalic and atraumatic without obvious abnormalities. No apparent alopecia or  balding. Eyes:  No corneal or conjunctival inflammation noted. EOMI. Perrla. Funduscopic exam benign, without hemorrhages, exudates or papilledema. Vision grossly normal. Ears:  External ear exam shows no significant lesions or deformities.  Otoscopic examination reveals clear canals, tympanic membranes are intact bilaterally without bulging, retraction, inflammation or discharge. Hearing is grossly normal bilaterally. Nose:  External nasal examination shows no deformity or inflammation. Nasal mucosa are pink and moist without lesions or exudates. Mouth:  Oral mucosa and oropharynx without lesions or exudates.  Dentures Neck:  No deformities, masses, or tenderness noted. Chest Wall:  Mild tenderness in left breast lumpectomy scar site Breasts:  No mass, nodules, thickening, tenderness, bulging, retraction, inflamation, nipple discharge or skin changes noted.   Lungs:  Decreased BS throughout.  No wheeze or crackles Heart:  Normal rate and regular rhythm. S1 and S2 normal without gallop, murmur, click, rub or other extra sounds. Abdomen:  Bowel sounds positive,abdomen soft and non-tender without masses, organomegaly or hernias noted. Rectal:  No external abnormalities noted. Normal sphincter tone. No rectal masses or tenderness.  Heme negative light brown stool Genitalia:  Pelvic Exam:        External: normal female genitalia without lesions or masses        Vagina: normal without lesions or masses        Cervix: normal without lesions or masses.  Atrophic         Adnexa: normal bimanual exam without masses or fullness        Uterus: normal by palpation        Pap smear: performed Msk:  No deformity or scoliosis noted of thoracic or lumbar spine.   Pulses:  R and L carotid,radial,femoral,dorsalis pedis and posterior tibial pulses are full and equal bilaterally Extremities:  No clubbing, cyanosis, edema, or deformity noted with normal full range of motion of all joints.   Neurologic:  No cranial  nerve deficits noted. Station and gait are normal. Plantar reflexes are down-going bilaterally. DTRs are symmetrical throughout. Sensory, motor and coordinative functions appear intact. Skin:  Dry, flaking skin on low back Cervical Nodes:  No lymphadenopathy noted Axillary Nodes:  No palpable lymphadenopathy Inguinal Nodes:  No significant adenopathy Psych:  Cognition and judgment appear intact. Alert and cooperative with normal attention span and concentration. No apparent delusions, illusions, hallucinations.  Mild anxiety   Impression & Recommendations:  Problem # 1:  ROUTINE GYNECOLOGICAL EXAMINATION (ICD-V72.31)  Mammogram in April-scheduled  Orders: UA Dipstick w/o Micro (manual) (16109) KOH/ WET [place] [phone]) Pap Smear, Thin Prep ( Collection of) [id number]) T- GC Chlamydia (91478) T-HIV Antibody  (Reflex) ([id number]) T-Syphilis Test (RPR) ([id number]) T-Pap Smear, Thin Prep (28413)  Problem # 2:  HEALTH MAINTENANCE EXAM (ICD-V70.0) Guaiac cards x3 to return in 2 weeks Immunizations up to date.  Problem # 3:  OSTEOPENIA (ICD-733.90) Encouraged adequate calcium and Vitamin D intake as well as biphosphonate Her updated medication list for this problem includes:    Boniva 150 Mg Tabs (Ibandronate sodium) .Marland Kitchen... Take one (1) tablet once a month  Problem # 4:  DEPRESSION (ICD-311)  Start Wellbutrin and use Alprazolam prn Her updated medication list for this problem includes:  Alprazolam 0.5 Mg Tabs (Alprazolam) .Marland Kitchen... 1/2 tab by mouth in morning    Wellbutrin Sr 150 Mg Xr12h-tab (Bupropion hcl) .Marland Kitchen... 1 tab by mouth every morning for 3 days, then 1 tab by mouth two times a day  Orders: Psychology Referral (Psychology)  Complete Medication List: 1)  Avapro 150 Mg Tabs (Irbesartan) .Marland Kitchen.. 1 tab by mouth daily 2)  Levothroid 75 Mcg Tabs (Levothyroxine sodium) .Marland Kitchen.. 1 tab by mouth daily 3)  Boniva 150 Mg Tabs (Ibandronate sodium) .... Take one (1) tablet once a month 4)  Nexium  40 Mg Cpdr (Esomeprazole magnesium) .... Take one (1) capsule once daily 5)  Bayer Aspirin Ec Low Dose 81 Mg Tbec (Aspirin) .Marland Kitchen.. 1 tab once daily 6)  Lipitor 40 Mg Tabs (Atorvastatin calcium) .Marland Kitchen.. 1 tab by mouth daily 7)  Alprazolam 0.5 Mg Tabs (Alprazolam) .... 1/2 tab by mouth in morning 8)  Triamcinolone Acetonide 0.1 % Crea (Triamcinolone acetonide) .... Apply two times a day to affected area prn 9)  Flonase 50 Mcg/act Susp (Fluticasone propionate) .... 2 sprays each nostril daily 10)  Cipro 500 Mg Tabs (Ciprofloxacin hcl) .Marland Kitchen.. 1 tab by mouth two times a day for 10 days 11)  Wellbutrin Sr 150 Mg Xr12h-tab (Bupropion hcl) .Marland Kitchen.. 1 tab by mouth every morning for 3 days, then 1 tab by mouth two times a day  Other Orders: T-Comprehensive Metabolic Panel [phone]) T-TSH [id number]) T-Lipid Profile [id number]) T-Culture, Urine ([id number])  Patient Instructions: 1)  Follow up with Dr. Delrae Alfred in 1 month--depression 2)  Referral to [facility] 3)  CPP in 1 year with Dr. Delrae Alfred  Preventive Care Screening  Pap Smear:    Date:  07/08/2008    Results:  normal--atrophic pattern   Prior Values:    Pap Smear:  normal (04/16/2007)    Mammogram:  No new suspicious changes--stable lumpectomy changes in left breast (03/24/2009)    Colonoscopy:  4 polyps--3 sent for path--all hyperplastic.  Dr. Baird Lyons, however, recommends repeat colonoscopy in 3 years. (07/12/2009)    Bone Density:  stable (02/17/2008)    Last Tetanus Booster:  Tdap (07/07/2008)    Last Flu Shot:  Fluvax 3+ (09/07/2009)    Last Pneumovax:  Pneumovax (09/25/2007)    Dexa Interp:  stable (02/17/2008)     SBE:  no Osteoprevention:  Stopped Calcium and vitamin D--could not afford.  2 cups milk daily, cheese 2-3 times weekly.  yogurt every day.  Some exercising--waiting for knee to heal a bit more.  Prescriptions: FLONASE 50 MCG/ACT SUSP (FLUTICASONE PROPIONATE) 2 sprays each nostril daily  #1 x 11   Entered and  Authorized by:   Julieanne Manson MD   Signed by:   Julieanne Manson MD on 12/29/2009   Method used:   Print then Give to Patient   RxID:   [id number] LIPITOR 40 MG  TABS (ATORVASTATIN CALCIUM) 1 tab by mouth daily  #30 x 11   Entered and Authorized by:   Julieanne Manson MD   Signed by:   Julieanne Manson MD on 12/29/2009   Method used:   Print then Give to Patient   RxID:   [id number] BAYER ASPIRIN EC LOW DOSE 81 MG  TBEC (ASPIRIN) 1 tab once daily  #30 x 11   Entered and Authorized by:   Julieanne Manson MD   Signed by:   Julieanne Manson MD on 12/29/2009   Method used:   Print then Give to Patient   RxID:   [id number] NEXIUM 40 MG  CPDR (ESOMEPRAZOLE MAGNESIUM) TAKE ONE (1) CAPSULE once daily  #30 x 11   Entered and Authorized by:   Julieanne Manson MD   Signed by:   Julieanne Manson MD on 12/29/2009   Method used:   Print then Give to Patient   RxID:   [id number] BONIVA 150 MG TABS (IBANDRONATE SODIUM) TAKE ONE (1) TABLET ONCE A MONTH  #1 x 11   Entered and Authorized by:   Julieanne Manson MD   Signed by:   Julieanne Manson MD on 12/29/2009   Method used:   Print then Give to Patient   RxID:   [id number] LEVOTHROID 75 MCG  TABS (LEVOTHYROXINE SODIUM) 1 tab by mouth daily  #30 x 11   Entered and Authorized by:   Julieanne Manson MD   Signed by:   Julieanne Manson MD on 12/29/2009   Method used:   Print then Give to Patient   RxID:   [id number] AVAPRO 150 MG  TABS (IRBESARTAN) 1 tab by mouth daily  #30 x 11   Entered and Authorized by:   Julieanne Manson MD   Signed by:   Julieanne Manson MD on 12/29/2009   Method used:   Print then Give to Patient   RxID:   [id number] WELLBUTRIN SR 150 MG XR12H-TAB (BUPROPION HCL) 1 tab by mouth every morning for 3 days, then 1 tab by mouth two times a day  #60 x 2   Entered and Authorized by:   Julieanne Manson MD   Signed by:   Julieanne Manson MD on 12/29/2009    Method used:   Print then Give to Patient   RxID:   [id number]   Laboratory Results   Urine Tests    Routine Urinalysis   Glucose: negative   (Normal Range: Negative) Bilirubin: negative   (Normal Range: Negative) Ketone: negative   (Normal Range: Negative) Spec. Gravity: <1.005   (Normal Range: 1.003-1.035) Blood: negative   (Normal Range: Negative) pH: 6.5   (Normal Range: 5.0-8.0) Protein: negative   (Normal Range: Negative) Urobilinogen: 0.2   (Normal Range: 0-1) Nitrite: negative   (Normal Range: Negative) Leukocyte Esterace: negative   (Normal Range: Negative)

## 2011-01-08 NOTE — Assessment & Plan Note (Signed)
Summary: f/u appt/#/cd   Vital Signs:  Patient profile:   65 year old female Height:      65 inches (165.10 cm) Weight:      151.50 pounds (68.86 kg) BMI:     25.30 O2 Sat:      95 % on Room air Temp:     98.4 degrees F (36.89 degrees C) oral Pulse rate:   78 / minute BP sitting:   140 / 88  (left arm) Cuff size:   regular  Vitals Entered By: Brenton Grills MA (July 26, 2010 11:06 AM)  O2 Flow:  Room air CC: F/U appt/aj   Primary Care Provider:  Newt Lukes MD  CC:  F/U appt/aj.  History of Present Illness: here for f/u  1) HTN - reports compliance with ongoing medical treatment and no changes in medication dose or frequency. denies adverse side effects related to current therapy. no CP, HA or edema  2) hx breast cancer - follows with onc for same ([facility]) - s/p lumpectomt and xrt, then antiest thru 09/2008 - reviewed recent labs  3) hypothryoid - reports compliance with ongoing medical treatment; last changes in medication dose or frequency 01/2010. denies adverse side effects related to current therapy. no weight or skin changes, ongoing fatigue  4) dyslipidemia - reports noncompliance with prescribed medical treatment. concerned about adverse side effects related to therapy: fatigue and tiredness. admits to no change in fatigue symptoms since stopping lipitor tx but would like to try alt chol med  5) COPD - ongoing continued tobacco, has tried chantix prev without success - +DOE and cough with chronic sinus and ear congestion - unresponsive to antihist and nasal steroids - no sputum or hemopytsis -    Clinical Review Panels:  Immunizations   Last Tetanus Booster:  Tdap (07/07/2008)   Last Flu Vaccine:  Fluvax 3+ (09/07/2009)   Last Pneumovax:  Pneumovax (09/25/2007)  Lipid Management   Cholesterol:  154 (12/29/2009)   LDL (bad choesterol):  85 (12/29/2009)   HDL (good cholesterol):  54 (12/29/2009)  CBC   WBC:  8.1 (04/12/2010)   RBC:  4.56  (04/12/2010)   Hgb:  14.5 (04/12/2010)   Hct:  43.1 (04/12/2010)   Platelets:  253 (04/12/2010)   MCV  94.5 (04/12/2010)   MCHC  32.4 (09/07/2009)   RDW  14.1 (04/12/2010)   PMN:  55.7 (04/12/2010)   Lymphs:  22 (09/07/2009)   Monos:  10.4 (04/12/2010)   Eosinophils:  2.8 (04/12/2010)   Basophil:  0.5 (04/12/2010)  Complete Metabolic Panel   Glucose:  93 (04/12/2010)   Sodium:  137 (04/12/2010)   Potassium:  3.9 (04/12/2010)   Chloride:  103 (04/12/2010)   CO2:  22 (04/12/2010)   BUN:  9 (04/12/2010)   Creatinine:  0.73 (04/12/2010)   Albumin:  4.4 (04/12/2010)   Total Protein:  6.7 (04/12/2010)   Calcium:  9.5 (04/12/2010)   Total Bili:  0.9 (04/12/2010)   Alk Phos:  81 (04/12/2010)   SGPT (ALT):  14 (04/12/2010)   SGOT (AST):  16 (04/12/2010)   Current Medications (verified): 1)  Avapro 150 Mg  Tabs (Irbesartan) .Marland Kitchen.. 1 Tab By Mouth Daily 2)  Levothroid 88 Mcg Tabs (Levothyroxine Sodium) .Marland Kitchen.. 1 Tab By Mouth Daily 3)  Boniva 150 Mg Tabs (Ibandronate Sodium) .... Take One (1) Tablet Once A Month 4)  Nexium 40 Mg Cpdr (Esomeprazole Magnesium) .... Take One (1) Capsule Once Daily 5)  Flonase 50 Mcg/act Susp (Fluticasone Propionate) .Marland KitchenMarland KitchenMarland Kitchen  2 Sprays Each Nostril Daily 6)  Nitrofurantoin Macrocrystal 100 Mg Caps (Nitrofurantoin Macrocrystal) .... Take 1 By Mouth Once Daily As Needed 7)  Lipitor 40 Mg Tabs (Atorvastatin Calcium) .Marland Kitchen.. 1 By Mouth Once Daily  Allergies (verified): 1)  Levaquin 2)  Codeine  Past History:  Past Medical History: COPD HYPERLIPIDEMIA HYPERTENSION HYPOTHYROIDISM ANXIETY DEPRESSION OSTEOPENIA  HYPERTENSION CARCINOMA, BREAST, HX OF - left lumpectomy 01/2006 s/p XRT GERD  Allergic rhinitis  MD roster: onc - [facility] - [facility] - [facility]  Social History: Divorced unemployed Lives alone. No significant other -  Review of Systems  The patient denies weight loss, chest pain, syncope, and headaches.    Physical  Exam  General:  alert, well-developed, well-nourished, and cooperative to examination.   speech quality of one with hearing loss Lungs:  normal respiratory effort, no intercostal retractions or use of accessory muscles; normal breath sounds bilaterally - no crackles and no wheezes.    Heart:  normal rate, regular rhythm, no murmur, and no rub. BLE without edema.  Psych:  Oriented X3, memory intact for recent and remote, normally interactive, good eye contact, not anxious appearing, not depressed appearing, and not agitated.      Impression & Recommendations:  Problem # 1:  HYPERLIPIDEMIA (ICD-272.4)  Her updated medication list for this problem includes:    Pravastatin Sodium 20 Mg Tabs (Pravastatin sodium) .Marland Kitchen... 1 by mouth at bedtime  advised to resume statin, will change lipitor to prava pt reports 04/17/10 episode of transient vison loss in left eye - , then normal return sight. re-arrange carotid u/s r/o ICS stenosis - no  bruit on exam, never kep appt when sched 04/2010 The following medications were removed from the medication list:    Lipitor 40 Mg Tabs (Atorvastatin calcium) .Marland Kitchen... 1 tab by mouth daily  Labs Reviewed: SGOT: 16 (04/12/2010)   SGPT: 14 (04/12/2010)  Prior 10 Yr Risk Heart Disease: 9 % (10/28/2008)   HDL:54 (12/29/2009), 50 (07/07/2008)  LDL:85 (12/29/2009), 76 (07/07/2008)  Chol:154 (12/29/2009), 143 (07/07/2008)  Trig:77 (12/29/2009), 84 (07/07/2008)  Orders: Prescription Created Electronically [phone]) [facility] ([facility])  Problem # 2:  HYPERTENSION (ICD-401.9)  change ARB to formulary drug Her updated medication list for this problem includes:    Benicar 20 Mg Tabs (Olmesartan medoxomil) .Marland Kitchen... 1 by mouth once daily  BP today: 140/88 Prior BP: 130/80 (05/03/2010)  Prior 10 Yr Risk Heart Disease: 9 % (10/28/2008)  Labs Reviewed: K+: 3.9 (04/12/2010) Creat: : 0.73 (04/12/2010)   Chol: 154 (12/29/2009)   HDL: 54 (12/29/2009)   LDL: 85  (12/29/2009)   TG: 77 (12/29/2009)  Orders: Prescription Created Electronically [phone]) [facility] (Vascular)  Problem # 3:  COPD (ICD-496)  stongly advised to quit smoking - counseled on same, see below - low normal O2, likely contrib to dyspnea and fatigue -  Pulmonary Functions Reviewed: O2 sat: 95 (07/26/2010)     Vaccines Reviewed: Pneumovax: Pneumovax (09/25/2007)   Flu Vax: Fluvax 3+ (09/07/2009)  Orders: Tobacco use cessation intermediate 3-10 minutes ([id number])  Problem # 4:  HYPOTHYROIDISM (ICD-244.9)  Her updated medication list for this problem includes:    Levothroid 88 Mcg Tabs (Levothyroxine sodium) .Marland Kitchen... 1 tab by mouth daily  Labs Reviewed: TSH: 6.262 (12/29/2009)    Chol: 154 (12/29/2009)   HDL: 54 (12/29/2009)   LDL: 85 (12/29/2009)   TG: 77 (12/29/2009) 5 minutes today spent on patient education regarding the unhealthy effects of continued tobacco abuse and encouragment of cessation including  medical options available to help patient to quit smoking.   Complete Medication List: 1)  Benicar 20 Mg Tabs (Olmesartan medoxomil) .Marland Kitchen.. 1 by mouth once daily 2)  Levothroid 88 Mcg Tabs (Levothyroxine sodium) .Marland Kitchen.. 1 tab by mouth daily 3)  Boniva 150 Mg Tabs (Ibandronate sodium) .... Take one (1) tablet once a month 4)  Nexium 40 Mg Cpdr (Esomeprazole magnesium) .... Take one (1) capsule once daily 5)  Flonase 50 Mcg/act Susp (Fluticasone propionate) .... 2 sprays each nostril daily 6)  Nitrofurantoin Macrocrystal 100 Mg Caps (Nitrofurantoin macrocrystal) .... Take 1 by mouth once daily as needed 7)  Pravastatin Sodium 20 Mg Tabs (Pravastatin sodium) .Marland Kitchen.. 1 by mouth at bedtime  Patient Instructions: 1)  it was good to see you today.  2)  we'll make referral for carotid ultrasound to evaluate blood flow in your neck . Our office will contact you regarding this appointment once made.  3)  Tobacco is very bad for your health and your loved ones! You Should stop  smoking! You will feel better if you can quit! 4)  change avapro to benicar per formulary change 5)  change lipitor to pravastatin - take as directed 6)  your prescriptions have been electronically submitted to your pharmacy. Please take as directed. Contact our office if you believe you're having problems with the medication(s).  7)  continue to follow with your specilaists as ongoing as ask they send [place] copies of any visits or test results 8)  Please schedule a follow-up appointment in 4 months, sooner if problems.  Prescriptions: BENICAR 20 MG TABS (OLMESARTAN MEDOXOMIL) 1 by mouth once daily  #30 x 6   Entered and Authorized by:   Newt Lukes MD   Signed by:   Newt Lukes MD on 07/26/2010   Method used:   Electronically to        [facility] [address] (retail)       [phone]       [place], [place]  [address]       Ph: [phone]       Fax: [phone]   RxID:   [id number] PRAVASTATIN SODIUM 20 MG TABS (PRAVASTATIN SODIUM) 1 by mouth at bedtime  #30 x 6   Entered and Authorized by:   Newt Lukes MD   Signed by:   Newt Lukes MD on 07/26/2010   Method used:   Electronically to        [facility] [address] (retail)       [phone]       [place], [place]  [address]       Ph: [phone]       Fax: [phone]   RxID:   [id number]

## 2011-01-08 NOTE — Progress Notes (Signed)
Summary: ALT med  Phone Note Call from Patient Call back at [facility] Phone [phone]   Caller: Patient Summary of Call: Pt called stating that Pravastatin at bedtime is causing her trouble sleeping. Pt is requesting to change to every morning or to one of her covered alternatives; Benicar, Diovan, Lisinopril or Losartan potassium. Please advise Initial call taken by: Margaret Pyle, CMA,  August 02, 2010 9:23 AM  Follow-up for Phone Call        ok to take pravastatin in AM if it causes sedation - this med is for cholesterol; the others listed are for blood pressure and she should ALSO be taking benicar for blood pressure as we rx'd last OV per her formulary prefs - thanks Follow-up by: Newt Lukes MD,  August 02, 2010 11:38 AM  Additional Follow-up for Phone Call Additional follow up Details #1::        Pt advised and will continue with Benicar for BP and Pravastaing in the am. Additional Follow-up by: Margaret Pyle, CMA,  August 02, 2010 11:50 AM

## 2011-01-08 NOTE — Progress Notes (Signed)
Summary: Michaela Guerra pt(UTI)  Phone Note Call from Patient Call back at [facility] Phone [phone]   Summary of Call: The pt wants to speak with the provider if that is possible because the pt has a severe pain probably for the urinary infection so she is wondering if the provider can call her some antibiotics.  Pt took AZO but she only can take for 2 days, which help with the pain but not for the infection.  [facility] Gwynn Burly) Delrae Alfred MD Initial call taken by: Manon Hilding,  January 23, 2010 10:08 AM  Follow-up for Phone Call        Left message on answering machine for pt to return call at [phone].  Last culture was completly normal. Follow-up by: Vesta Mixer CMA,  January 23, 2010 12:50 PM  Additional Follow-up for Phone Call Additional follow up Details #1::        pt states she is in pain and she has tried to OTC meds for the recurrent UTI. This one is worst. Pt states she is finally scheduled to go to [facility] on 3/2 for her UTI's. She also states that when she recently has her urine checked she was on antibiotics at that time as well and maybe thats why is was clear.. Additional Follow-up by: Mikey College CMA,  January 23, 2010 1:01 PM    Additional Follow-up for Phone Call Additional follow up Details #2::    Spoke to patient and explained to her that we at least need a u/a and urine culture before prescribing medications.  She has taken cipro on at least 2 occasions over the last couple of months.  It is poss that she may have developed resistance at this point and I explained that to her.   She denies any chills.  Not sure if she has fever.  + pelvic pressure and pain + dysuria + urgency + hesitancy  She took OTC meds x 2 days without relief. She notes that she cannot get to our office today to have a urine sample done. But, she will come in the morning to have a culture. Dr. Delrae Alfred or I should be notified while she is here to review her u/a and give her a Rx (if  appropriate). She will need a u/a AND a urine culture (even if her u/a is clean).  Follow-up by: Tereso Newcomer PA-C,  January 23, 2010 2:36 PM

## 2011-01-08 NOTE — Progress Notes (Signed)
Summary: Pt?  Phone Note Call from Patient Call back at Home Phone [phone]   Caller: Patient Summary of Call: Pt called stating that she was unable to take Lipitor because it made her feel "terrible". Pt was not able to give specifice symptoms. Pt also says she would like to stop taking Nexium but everytime she stops she has severe abd pain. I advised pt that stopping might not be the best thing for her if it causes abd pain but pt is insistant that she would like to stop and she says she discussed this with MD at [facility]. Please advise. Initial call taken by: Margaret Pyle, CMA,  June 27, 2010 9:29 AM  Follow-up for Phone Call        ok to stop nexium - if cont stomach pain, rov to eval this problem - rec reducing lipitor dose by 1/2 rather than stopping the med  - if pt unable to tolerate 1/2 dose, make rov to discuss other options - thanks Follow-up by: Newt Lukes MD,  June 27, 2010 11:06 AM  Additional Follow-up for Phone Call Additional follow up Details #1::        Pt informed and agrees to try 1/2 dose of Lipitor and will d/c Nexium and call if ROV is needed. Additional Follow-up by: Margaret Pyle, CMA,  June 27, 2010 11:11 AM

## 2011-01-08 NOTE — Letter (Signed)
[facility]  [facility]   Imported By: Arta Bruce 11/06/2010 14:04:02  _____________________________________________________________________  External Attachment:    Type:   Image     Comment:   External Document

## 2011-01-08 NOTE — Progress Notes (Signed)
Summary: follow up from lab result letter  Phone Note Call from Patient Call back at [facility] Phone [phone]   Summary of Call: PT JUST GOT THE LETTER FROM LAB WORK RESULT IT SHOW THAT HER THYROID IS NOT QUITE RIGHT AND SHE JUST WANTS TO LET KNOW THE PROVIDER THAT SHE IS TAKING THE THYROID MEDICATION RELIGIOUSLY AND ASK HER IF SHE NEEDS TO INCREASE AND SHE HAS BEEN EXHAUSTED FOR A LONG TIME AND HEART PALPITATIONS.  SHE IS WONDERING IF THE PROVIDER CAN CALL HER BACK TO MAKE SURE WHAT TO DO AND IF SHE NEEDS TO UPDATE HER MEDICATION SHE GOES TO THE [facility]ALTH [facility]PART[facility]ARM[facility].   Crosby Oriordan MD. Initial call taken by: Manon Hilding,  January 22, 2010 9:19 AM  Follow-up for Phone Call        Left message on answering machine for pt to return call  Follow-up by: Vesta Mixer CMA,  January 23, 2010 12:51 PM  Additional Follow-up for Phone Call Additional follow up Details #1::        pt states that she has not missed any doses. But she also wanted to remind provider that this was the dose that [facility] started her on and you wanted her to continue taking for a month and this is the results from that bloodwork.  Additional Follow-up by: Mikey College CMA,  January 23, 2010 12:58 PM    Additional Follow-up for Phone Call Additional follow up Details #2::    Increasing her Levothroid to 88 micrograms and will need to have her scheduled for TSH in 6 weeks please. Follow-up by: Julieanne Manson MD,  January 24, 2010 1:09 PM  Additional Follow-up for Phone Call Additional follow up Details #3:: Details for Additional Follow-up Action Taken: Pt aware. Additional Follow-up by: Vesta Mixer CMA,  January 24, 2010 3:17 PM  New/Updated Medications: LEVOTHROID 88 MCG TABS (LEVOTHYROXINE SODIUM) 1 tab by mouth daily Prescriptions: LEVOTHROID 88 MCG TABS (LEVOTHYROXINE SODIUM) 1 tab by mouth daily  #30 x 6   Entered and Authorized by:   Julieanne Manson MD   Signed by:   Julieanne Manson MD on  01/24/2010   Method used:   Printed then faxed to ...       [facility] Department (retail)       [address] [place], [place]  [address]       Ph: [phone]       Fax: [phone]   RxID:   [id number]

## 2011-01-10 ENCOUNTER — Encounter: Payer: Medicare Other | Admitting: Oncology

## 2011-01-10 DIAGNOSIS — C50219 Malignant neoplasm of upper-inner quadrant of unspecified female breast: Secondary | ICD-10-CM

## 2011-01-10 LAB — COMPREHENSIVE METABOLIC PANEL
ALT: 12 U/L (ref 0–35)
Alkaline Phosphatase: 86 U/L (ref 39–117)
Sodium: 138 mEq/L (ref 135–145)
Total Bilirubin: 0.8 mg/dL (ref 0.3–1.2)
Total Protein: 6.9 g/dL (ref 6.0–8.3)

## 2011-01-10 LAB — CBC WITH DIFFERENTIAL/PLATELET
BASO%: 0.5 % (ref 0.0–2.0)
Basophils Absolute: 0 10*3/uL (ref 0.0–0.1)
EOS%: 3.4 % (ref 0.0–7.0)
HGB: 14.5 g/dL (ref 11.6–15.9)
MCH: 31 pg (ref 25.1–34.0)
MCHC: 33.8 g/dL (ref 31.5–36.0)
MCV: 91.5 fL (ref 79.5–101.0)
MONO%: 8.6 % (ref 0.0–14.0)
NEUT%: 59 % (ref 38.4–76.8)
RDW: 14.5 % (ref 11.2–14.5)
lymph#: 2.2 10*3/uL (ref 0.9–3.3)

## 2011-01-10 NOTE — Letter (Signed)
Summary: [facility]  [facility]   Imported By: Sherian Rein 11/28/2010 07:26:53  _____________________________________________________________________  External Attachment:    Type:   Image     Comment:   External Document

## 2011-01-10 NOTE — Letter (Signed)
Summary: HEMATOLOGY/MEDICAL ONCOLOGY  HEMATOLOGY/MEDICAL ONCOLOGY   Imported By: Arta Bruce 12/12/2010 14:31:14  _____________________________________________________________________  External Attachment:    Type:   Image     Comment:   External Document

## 2011-01-10 NOTE — Letter (Signed)
Summary: psychology referral//no showed  psychology referral//no showed   Imported By: Arta Bruce 12/17/2010 10:23:26  _____________________________________________________________________  External Attachment:    Type:   Image     Comment:   External Document

## 2011-01-11 NOTE — Letter (Signed)
Summary: [facility]  [facility]   Imported By: Sherian Rein 05/22/2010 11:11:54  _____________________________________________________________________  External Attachment:    Type:   Image     Comment:   External Document

## 2011-01-11 NOTE — Letter (Signed)
Summary: [facility]  NOTE  [facility]OFF[facility]  NOTE   Imported By: Arta Bruce 05/24/2010 10:38:53  _____________________________________________________________________  External Attachment:    Type:   Image     Comment:   External Document

## 2011-01-17 ENCOUNTER — Other Ambulatory Visit: Payer: Self-pay | Admitting: Oncology

## 2011-01-17 ENCOUNTER — Encounter (HOSPITAL_BASED_OUTPATIENT_CLINIC_OR_DEPARTMENT_OTHER): Payer: Medicare Other | Admitting: Oncology

## 2011-01-17 DIAGNOSIS — Z9889 Other specified postprocedural states: Secondary | ICD-10-CM

## 2011-01-17 DIAGNOSIS — C50219 Malignant neoplasm of upper-inner quadrant of unspecified female breast: Secondary | ICD-10-CM

## 2011-01-17 DIAGNOSIS — Z170 Estrogen receptor positive status [ER+]: Secondary | ICD-10-CM

## 2011-01-17 DIAGNOSIS — M899 Disorder of bone, unspecified: Secondary | ICD-10-CM

## 2011-01-28 ENCOUNTER — Emergency Department (HOSPITAL_COMMUNITY): Payer: Medicare Other

## 2011-01-28 ENCOUNTER — Inpatient Hospital Stay (HOSPITAL_COMMUNITY)
Admission: EM | Admit: 2011-01-28 | Discharge: 2011-01-30 | DRG: 814 | Disposition: A | Discharge status: Home / Self Care | Payer: Medicare Other | Attending: Internal Medicine | Admitting: Internal Medicine

## 2011-01-28 DIAGNOSIS — D7389 Other diseases of spleen: Principal | ICD-10-CM | POA: Diagnosis present

## 2011-01-28 DIAGNOSIS — I10 Essential (primary) hypertension: Secondary | ICD-10-CM | POA: Diagnosis present

## 2011-01-28 DIAGNOSIS — Z923 Personal history of irradiation: Secondary | ICD-10-CM

## 2011-01-28 DIAGNOSIS — K219 Gastro-esophageal reflux disease without esophagitis: Secondary | ICD-10-CM | POA: Diagnosis present

## 2011-01-28 DIAGNOSIS — E039 Hypothyroidism, unspecified: Secondary | ICD-10-CM | POA: Diagnosis present

## 2011-01-28 DIAGNOSIS — J690 Pneumonitis due to inhalation of food and vomit: Secondary | ICD-10-CM | POA: Diagnosis present

## 2011-01-28 DIAGNOSIS — F17200 Nicotine dependence, unspecified, uncomplicated: Secondary | ICD-10-CM | POA: Diagnosis present

## 2011-01-28 DIAGNOSIS — Z79899 Other long term (current) drug therapy: Secondary | ICD-10-CM

## 2011-01-28 DIAGNOSIS — Z853 Personal history of malignant neoplasm of breast: Secondary | ICD-10-CM

## 2011-01-28 DIAGNOSIS — Z8612 Personal history of poliomyelitis: Secondary | ICD-10-CM

## 2011-01-28 DIAGNOSIS — Z803 Family history of malignant neoplasm of breast: Secondary | ICD-10-CM

## 2011-01-28 DIAGNOSIS — K7689 Other specified diseases of liver: Secondary | ICD-10-CM | POA: Diagnosis present

## 2011-01-28 LAB — CBC
HCT: 43.5 % (ref 36.0–46.0)
MCHC: 33.3 g/dL (ref 30.0–36.0)
MCV: 91.4 fL (ref 78.0–100.0)
RDW: 14.1 % (ref 11.5–15.5)
WBC: 12.6 10*3/uL — ABNORMAL HIGH (ref 4.0–10.5)

## 2011-01-28 LAB — COMPREHENSIVE METABOLIC PANEL
Alkaline Phosphatase: 89 U/L (ref 39–117)
BUN: 11 mg/dL (ref 6–23)
Glucose, Bld: 113 mg/dL — ABNORMAL HIGH (ref 70–99)
Potassium: 4 mEq/L (ref 3.5–5.1)
Total Protein: 7.1 g/dL (ref 6.0–8.3)

## 2011-01-28 LAB — URINALYSIS, ROUTINE W REFLEX MICROSCOPIC
Protein, ur: NEGATIVE mg/dL
Specific Gravity, Urine: 1.011 (ref 1.005–1.030)
Urine Glucose, Fasting: NEGATIVE mg/dL
pH: 7.5 (ref 5.0–8.0)

## 2011-01-28 LAB — DIFFERENTIAL
Eosinophils Absolute: 0 10*3/uL (ref 0.0–0.7)
Lymphocytes Relative: 15 % (ref 12–46)
Lymphs Abs: 1.9 10*3/uL (ref 0.7–4.0)
Neutrophils Relative %: 75 % (ref 43–77)

## 2011-01-28 LAB — LIPASE, BLOOD: Lipase: 30 U/L (ref 11–59)

## 2011-01-28 LAB — OCCULT BLOOD, POC DEVICE: Fecal Occult Bld: NEGATIVE

## 2011-01-28 MED ORDER — IOHEXOL 300 MG/ML  SOLN: 100.0000 mL | Freq: Once | INTRAMUSCULAR | Status: AC | PRN

## 2011-01-29 ENCOUNTER — Inpatient Hospital Stay (HOSPITAL_COMMUNITY): Payer: Medicare Other

## 2011-01-29 LAB — BASIC METABOLIC PANEL
CO2: 24 mEq/L (ref 19–32)
Calcium: 9.1 mg/dL (ref 8.4–10.5)
GFR calc Af Amer: >60 mL/min (ref >60)
GFR calc non Af Amer: >60 mL/min (ref >60)
Sodium: 137 mEq/L (ref 135–145)

## 2011-01-29 LAB — CBC
Hemoglobin: 12.5 g/dL (ref 12.0–15.0)
MCHC: 33.2 g/dL (ref 30.0–36.0)
Platelets: 251 10*3/uL (ref 150–400)
RDW: 14.5 % (ref 11.5–15.5)

## 2011-01-29 LAB — HEPATIC FUNCTION PANEL
AST: 17 U/L (ref 0–37)
Albumin: 3.3 g/dL — ABNORMAL LOW (ref 3.5–5.2)
Total Protein: 6 g/dL (ref 6.0–8.3)

## 2011-01-29 MED ORDER — IOHEXOL 300 MG/ML  SOLN
100.0000 mL | Freq: Once | INTRAMUSCULAR | Status: AC | PRN
  Administered 2011-01-29: 100 mL via INTRAVENOUS

## 2011-01-29 NOTE — H&P (Signed)
NAMEELZINA, DEVERA                ACCOUNT NO.:  [id number]  MEDICAL RECORD NO.:  [id number]           PATIENT TYPE:  E  LOCATION:  WLED                         FACILITY:  [facility]  PHYSICIAN:  Hilary Hertz, MD      DATE OF BIRTH:  [birth year]  DATE OF ADMISSION:  01/28/2011                             HISTORY & PHYSICAL   PRIMARY CARE PHYSICIAN:  Dr. Jovita Kussmaul  CHIEF COMPLAINT:  Abdominal pain.  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 yesterday.  The patient reports that the pain initially started over her entire abdomen and radiated to her back, started at 1:00 p.m. yesterday and was associated with nausea and vomiting three to four times each day and at one point she noted some black specks in her vomit.  She did not have any diarrhea but yesterday she had one stool, and which half of it was brown and half of it was black but then it has been normal since that time.  She has not been able to eat or drink because of the pain and the rest of her GI symptoms.  At this time she feels somewhat better after getting morphine in the emergency department.  However, with deep inspiration, she still has pretty severe pain in her left lower quadrant that radiates to her back.  She has not noticed any recent palpitations.  She has not had any recent abdominal trauma.  No other acute complaints.  REVIEW OF SYSTEMS:  Review of 10-organ systems is negative except as stated above in the HPI.  ALLERGIES:  CODEINE and LEVAQUIN.  MEDICATIONS: 1. Nitrofurantoin 100 mg daily for UTI prophylaxis. 2. Lorazepam 0.5 mg twice a day. 3. Nexium 40 mg daily. 4. Levothyroxine 88 mcg daily. 5. Benicar 20 mg daily.  PAST MEDICAL HISTORY:  Hypertension; hypothyroidism; breast cancer diagnosed 5 years  ago, status post radiation, chemotherapy and lumpectomy.  The patient has had recent followup for this last week and was told her labs were "indeterminate" and she cannot be graduated from the followup program, so she has additional testing planned next week. History of polio as a child.  Right knee surgery.  SOCIAL HISTORY:  Two-pack-per-day smoker for 45 years.  Drinks two 6- packs a week.  FAMILY HISTORY:  Mother and sister both had breast cancer.  PHYSICAL EXAM:  VITAL SIGNS:  128/85, 65, 20, 98.1 and 98%.  GENERAL: The patient is in no acute distress.  HEENT:  Mucous membranes moist. Sclerae anicteric.  CARDIOVASCULAR:  Regular rate and rhythm although I do hear some occasional PACs or PVCs.  No murmurs appreciated.  LUNGS: Clear to auscultation bilaterally.  ABDOMEN:  Soft, minimally distended, tender to palpation in the left lower quadrant.  Positive bowel sounds. EXTREMITIES:  No edema.  SKIN:  No rashes.  NEUROLOGIC:  Cranial nerves II-XII grossly intact.  Nonfocal.  PERTINENT LABORATORY AND X-RAY DATA:  White count 12.6, hemoglobin 14.5, platelets 260.  Sodium 136, potassium 4.0, chloride 104, bicarb 24, BUN 11, creatinine 0.81, glucose 113.  AST 24, ALT 14, lipase 30.  Guaiac- negative.  Troponin less than 0.05.  UA:  Negative nitrites, negative leukocyte esterase.  EKG sinus rhythm, rate of 85.  Acute abdominal series:  Nonobstructive bowel gas.  CT abdomen and pelvis shows findings suspicious for a splenic infarct.  However, on review of the CT scan with Surgery, it appears that half the patient's spleen is infarcted. She also has been an indeterminate hyperattenuating stricture within the inferior right hepatic lobe.  Differential includes  flash-filled hemangioma, liver adenoma or focal nodular hyperplasia, hypervascular mass may have a similar appearance.  There is solitary structure in the right hepatic lobe.    ASSESSMENT AND PLAN:  The patient is a 65 year old  woman with a remote history of breast cancer, history of hypertension, and hypothyroidism who presents with abdominal pain. 1. Abdominal pain:  This is likely due to the patient's splenic     infarction which was seen on the CT scan today.  In terms of     etiology for the splenic infarction, the patient is not in atrial     fibrillation, her EKG right now is currently showing sinus rhythm.     Will check hypercoagulable workup and will check an echo to further     define etiology.  If the hypercoagulable workup is positive, will     need to talk to hematology.  The patient denies any history of     trauma.  The vessels on her CT scan as I have reviewed this with     Surgery are not tortuous.  This does not put the patient at risk     for splenic torsion as the cause of this.  So, at this time the     main treatment is just pain control.  We will give the patient IV     morphine for pain control.  This case has been discussed with     Surgery and at this time they say that it usually takes 4 to 6     weeks for pain from a splenic infraction to resolve.  If the     patient has sudden worsening in pain, can repeat a CT.  However,     this is usually a nonsurgical issue.  Also, on review of the CT     scan with Surgery, there was no blood around the patient's splenic     capsule.  So, at this time, again, the treatment is just pain     control and she can see Surgery as an outpatient if needed.  If the     patient did have recurrent breast cancer, this could make her     hypercoagulable leading to this as a cause as well. 2. Hyperattenuating inferior right hepatic lobe lesion on the CT.     Will need to notify Oncology of this as the patient reports she was     recently followed up in clinic and was told that her monitoring was     indeterminate for her breast cancer recurrence, so they need to     know about this in case this is related at all to her breast     cancer.  I could also  consider discussing this finding with GI in     the morning. 3. Hypertension:  Continue the patient's home Benicar. 4. Hypothyroidism:  Continue the patient's home  Synthroid. 5. Gastroesophageal reflux disease:  Continue the patient's home     Nexium. 6. Fluid, electrolytes, nutrition:  We will put the patient on     maintenance IV fluids, replete electrolytes as needed and make her     n.p.o. with ice-chips for now while she is having severe pain. 7. Prophylaxis:  Lovenox for deep venous thrombosis prophylaxis.  DISPOSITION:  The patient is a Full Code.  She will be admitted for pain control.         ______________________________ Hilary Hertz, MD    JF/MEDQ  D:  01/28/2011  T:  01/28/2011  Job:  [id number]  Electronically Signed by Hilary Hertz MD on 01/29/2011 01:48:01 PM

## 2011-01-30 LAB — CBC
HCT: 39.1 % (ref 36.0–46.0)
Hemoglobin: 12.8 g/dL (ref 12.0–15.0)
MCV: 91.8 fL (ref 78.0–100.0)
Platelets: 220 10*3/uL (ref 150–400)
RBC: 4.26 MIL/uL (ref 3.87–5.11)
WBC: 8.9 10*3/uL (ref 4.0–10.5)

## 2011-01-30 LAB — COMPREHENSIVE METABOLIC PANEL
Albumin: 3.3 g/dL — ABNORMAL LOW (ref 3.5–5.2)
Alkaline Phosphatase: 70 U/L (ref 39–117)
BUN: 9 mg/dL (ref 6–23)
CO2: 23 mEq/L (ref 19–32)
Chloride: 106 mEq/L (ref 96–112)
GFR calc non Af Amer: >60 mL/min (ref >60)
Glucose, Bld: 108 mg/dL — ABNORMAL HIGH (ref 70–99)
Potassium: 3.6 mEq/L (ref 3.5–5.1)
Total Bilirubin: 1 mg/dL (ref 0.3–1.2)

## 2011-01-30 LAB — URINE CULTURE: Culture: NO GROWTH

## 2011-01-30 LAB — LUPUS ANTICOAGULANT PANEL: Lupus Anticoagulant: NOT DETECTED

## 2011-01-30 LAB — PROTEIN S ACTIVITY: Protein S Activity: 106 % (ref 69–129)

## 2011-01-30 LAB — ANTITHROMBIN III: AntiThromb III Func: 80 % (ref 76–126)

## 2011-01-31 ENCOUNTER — Other Ambulatory Visit: Payer: Self-pay | Admitting: Oncology

## 2011-01-31 DIAGNOSIS — C50919 Malignant neoplasm of unspecified site of unspecified female breast: Secondary | ICD-10-CM

## 2011-01-31 LAB — CARDIOLIPIN ANTIBODIES, IGG, IGM, IGA: Anticardiolipin IgG: 3 GPL U/mL — ABNORMAL LOW (ref <23)

## 2011-01-31 LAB — BETA-2-GLYCOPROTEIN I ABS, IGG/M/A: Beta-2 Glyco I IgG: 0 G Units (ref <20)

## 2011-02-01 LAB — PROTHROMBIN GENE MUTATION

## 2011-02-01 LAB — PROTEIN S, TOTAL: Protein S Ag, Total: 39 % — ABNORMAL LOW (ref 70–140)

## 2011-02-04 LAB — CULTURE, BLOOD (ROUTINE X 2): Culture  Setup Time: 201202211056

## 2011-02-13 ENCOUNTER — Ambulatory Visit (HOSPITAL_COMMUNITY)
Admission: RE | Admit: 2011-02-13 | Discharge: 2011-02-13 | Disposition: A | Discharge status: Home / Self Care | Payer: Medicare Other | Source: Ambulatory Visit | Attending: Oncology | Admitting: Oncology

## 2011-02-13 ENCOUNTER — Encounter (HOSPITAL_BASED_OUTPATIENT_CLINIC_OR_DEPARTMENT_OTHER): Payer: Medicare Other | Admitting: Oncology

## 2011-02-13 ENCOUNTER — Other Ambulatory Visit: Payer: Self-pay | Admitting: Oncology

## 2011-02-13 DIAGNOSIS — M899 Disorder of bone, unspecified: Secondary | ICD-10-CM

## 2011-02-13 DIAGNOSIS — Z170 Estrogen receptor positive status [ER+]: Secondary | ICD-10-CM

## 2011-02-13 DIAGNOSIS — C50219 Malignant neoplasm of upper-inner quadrant of unspecified female breast: Secondary | ICD-10-CM

## 2011-02-13 DIAGNOSIS — Z23 Encounter for immunization: Secondary | ICD-10-CM

## 2011-02-13 DIAGNOSIS — C50919 Malignant neoplasm of unspecified site of unspecified female breast: Secondary | ICD-10-CM

## 2011-02-13 LAB — CANCER ANTIGEN 27.29: CA 27.29: 37 U/mL (ref 0–39)

## 2011-02-14 ENCOUNTER — Ambulatory Visit (HOSPITAL_COMMUNITY)
Admission: RE | Admit: 2011-02-14 | Discharge: 2011-02-14 | Disposition: A | Discharge status: Home / Self Care | Payer: Medicare Other | Source: Ambulatory Visit | Attending: Oncology | Admitting: Oncology

## 2011-02-14 ENCOUNTER — Encounter (HOSPITAL_BASED_OUTPATIENT_CLINIC_OR_DEPARTMENT_OTHER): Payer: Medicare Other | Admitting: Oncology

## 2011-02-14 DIAGNOSIS — M949 Disorder of cartilage, unspecified: Secondary | ICD-10-CM

## 2011-02-14 DIAGNOSIS — K7689 Other specified diseases of liver: Secondary | ICD-10-CM | POA: Insufficient documentation

## 2011-02-14 DIAGNOSIS — C50219 Malignant neoplasm of upper-inner quadrant of unspecified female breast: Secondary | ICD-10-CM

## 2011-02-14 DIAGNOSIS — D7389 Other diseases of spleen: Secondary | ICD-10-CM | POA: Insufficient documentation

## 2011-02-14 DIAGNOSIS — Z170 Estrogen receptor positive status [ER+]: Secondary | ICD-10-CM

## 2011-02-14 MED ORDER — GADOXETATE DISODIUM 0.25 MMOL/ML IV SOLN
7.0000 mL | Freq: Once | INTRAVENOUS | Status: AC | PRN
  Administered 2011-02-14: 7 mL via INTRAVENOUS

## 2011-02-14 NOTE — Discharge Summary (Signed)
Michaela Guerra, Michaela Guerra                ACCOUNT NO.:  [id number]  MEDICAL RECORD NO.:  [id number]           PATIENT TYPE:  I  LOCATION:  1519                         FACILITY:  [facility]  PHYSICIAN:  Ladell Pier, M.D.   DATE OF BIRTH:  07-Mar-1946  DATE OF ADMISSION:  01/28/2011 DATE OF DISCHARGE:  01/30/2011                              DISCHARGE SUMMARY   DISCHARGE DIAGNOSES: 1. Splenic infarct with abdominal pain. 2. Hyperattenuated inferior right hepatic lobe lesion on CT.     Discussed CT finding with Dr. Darnelle Catalan, the patient has an     appointment on February 14, 2011. 3. Hypertension. 4. Hypothyroidism. 5. Gastroesophageal reflux disease. 6. History of breast cancer. 7. Hypothyroidism. 8. Right knee surgery.  DISCHARGE MEDICATIONS: 1. Percocet 5/325 one to two q.6 p.r.n. 2. Alka-Seltzer as needed. 3. Alprazolam 0.5 b.i.d. p.r.n. 4. Benicar 20 mg daily. 5. Levothyroxine 88 mcg every morning. 6. Nexium 40 mg daily. 7. Macrobid 100 mg by mouth daily as needed. 8. Simethicone 125 mg as needed. 9. Augmentin 875 b.i.d. x7 days.  FOLLOWUP APPOINTMENTS:  The patient is to follow up with Dr. Clide Deutscher in 1 week and to keep her appointment with Dr. Darnelle Catalan on February 14, 2011.  PROCEDURES:  CT angio of the chest, no evidence of acute PE, increased left lower lobe airspace disease with associated volume loss and bronchial opacification, possible secondary to aspiration.  Possible splenic infarct as noted on the exam performed yesterday.  CT scan of the abdomen and pelvis.  These findings are suspicious for splenic infarct, indeterminate hyperattenuating structure within the inferior right hepatic lobe.  Differential considerations include Flash- filling filled hemangioma, liver adenoma or focal nodular hyperplasia, hypervascular mets may have a similar appearance.  There is a solitary structure in the right hepatic lobe.  I find this to be less likely. Nonobstructive bowel gas pattern.   No active cardiopulmonary disease.  CONSULTANTS:  Discussed with Dr. Darnelle Catalan on the phone and the admitting physician discussed with General Surgery on the phone as well.  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 yesterday.  The patient reports that the pain initially started over her entire abdomen and radiated to her back, started at 1:00 p.m. yesterday and was associated with nausea and vomiting 3 to 4 times each day and at one point she noted some black specks in her vomit.  She did not have any diarrhea but yesterday she had one stool and which half of it was brown and half of it was black and then it has been normal since that time.  She has not been able to eat or drink because of the pain and the rest of her GI symptoms.  At that time, she felt somewhat better after getting morphine in the emergency room, however, with deep inspiration, she still has pretty severe pain in her left lower quadrant that radiates to her back.  She has not  noticed any recent palpitations.  She has not had any recent abdominal trauma.  No other acute complaints.  Past medical history, family history, social history, meds, allergies, as per admission H and P.  DISCHARGE PHYSICAL EXAMINATION:  VITAL SIGNS:  At the time of discharge, temperature 98.5, pulse of 73, respirations 18, blood pressure 150/85, pulse ox 90% on room air. GENERAL:  The patient is sitting up in chair, well-nourished white female. HEENT:  Head is normocephalic, atraumatic.  Pupils reactive to light. Throat without erythema. CARDIOVASCULAR:  She has regular rate and rhythm. LUNGS:  Her lungs are clear bilaterally. ABDOMEN:  Soft.  She was tender close to the rib area on the left. EXTREMITIES:  Without edema.  HOSPITAL  COURSE: 1. Nausea, vomiting, abdominal pain:  The patient's x-ray showed     question of splenic infarct.  She had a 2-D echo done that did not     show any emboli.  She had a CT of the chest done that showed     question of aspiration pneumonia.  This patient could have may be     aspirated when she had the nausea and the vomiting or this could be     some pneumonitis as the patient is afebrile and does not have any     leukocytosis but will call her in empirically some doxycycline 100     b.i.d. or I will call in some clindamycin empirically to take for 7     days for possible aspiration pneumonia but the patient clinically     does not seem to have any pneumonia. 2. Rib pain on the left side.  As mentioned before, we will call her     in some clindamycin but the patient does not have any cough and     does not feel short of breath.  At present, she does not have any     nausea or vomiting. 3. Question of splenic infarct:  Did discuss with Dr. Darnelle Catalan,     hypercoagulable panel has been ordered and she will follow up on     this outpatient. 4. Hypertension.  The patient was continued on her home medication. 5. Hypothyroidism.  The patient was continued on her home medication. 6. GERD.  The patient was continued on PPI.  DISCHARGE LABORATORY DATA:  Labs at the time of discharge:  Blood cultures negative x2.  Urine culture negative.  Lactic acid 0.9.  Sodium 136, potassium 3.6, chloride 106, CO2 of 23, glucose 108, BUN 9, creatinine 0.69, AST 17, ALT 14.  WBC 8.9, hemoglobin 12.8, MCV 91.8, platelet of 220.     Ladell Pier, M.D.     [place]/MEDQ  D:  01/30/2011  T:  01/30/2011  Job:  [id number]  cc:   Valentino Hue. Magrinat, M.D. Fax: [phone]  Dr. Clide Deutscher  Electronically Signed by Ladell Pier M.D. on 02/14/2011 07:55:02 AM

## 2011-02-15 ENCOUNTER — Encounter: Payer: Self-pay | Admitting: Gastroenterology

## 2011-02-19 NOTE — Letter (Signed)
Summary: New Patient letter  [facility]  [address] [place], [place] [address]   Phone: [phone]  Fax: [phone]       02/15/2011 MRN: [id number]  [facility] [address] [place], [place]  [address]  Dear Michaela Guerra,  Welcome to the Gastroenterology Division at [facility].    You are scheduled to see Dr.  Christella Hartigan on 03-27-11 at 10:30am on the 3rd floor at [facility], [address]  We ask that you try to arrive at our office 15 minutes prior to your appointment time to allow for check-in.  We would like you to complete the enclosed self-administered evaluation form prior to your visit and bring it with you on the day of your appointment.  We will review it with you.  Also, please bring a complete list of all your medications or, if you prefer, bring the medication bottles and we will list them.  Please bring your insurance card so that we may make a copy of it.  If your insurance requires a referral to see a specialist, please bring your referral form from your primary care physician.  Co-payments are due at the time of your visit and may be paid by cash, check or credit card.     Your office visit will consist of a consult with your physician (includes a physical exam), any laboratory testing he/she may order, scheduling of any necessary diagnostic testing (e.g. x-ray, ultrasound, CT-scan), and scheduling of a procedure (e.g. Endoscopy, Colonoscopy) if required.  Please allow enough time on your schedule to allow for any/all of these possibilities.    If you cannot keep your appointment, please call [phone] to cancel or reschedule prior to your appointment date.  This allows [place] the opportunity to schedule an appointment for another patient in need of care.  If you do not cancel or reschedule by 5 p.m. the business day prior to your appointment date, you will be charged a $50.00 late cancellation/no-show fee.    Thank you for choosing  [facility] for your medical needs.  We appreciate the opportunity to care for you.  Please visit [place] at our website  to learn more about our practice.                     Sincerely,                                                             The Gastroenterology Division

## 2011-02-27 LAB — POCT I-STAT, CHEM 8
Calcium, Ion: 1.22 mmol/L (ref 1.12–1.32)
Glucose, Bld: 83 mg/dL (ref 70–99)
HCT: 47 % — ABNORMAL HIGH (ref 36.0–46.0)
Hemoglobin: 16 g/dL — ABNORMAL HIGH (ref 12.0–15.0)
Potassium: 3.8 mEq/L (ref 3.5–5.1)

## 2011-03-14 ENCOUNTER — Other Ambulatory Visit: Payer: Self-pay | Admitting: Gastroenterology

## 2011-03-19 ENCOUNTER — Other Ambulatory Visit: Payer: Self-pay | Admitting: Oncology

## 2011-03-19 ENCOUNTER — Encounter (HOSPITAL_BASED_OUTPATIENT_CLINIC_OR_DEPARTMENT_OTHER): Payer: Medicare Other | Admitting: Oncology

## 2011-03-19 DIAGNOSIS — C50219 Malignant neoplasm of upper-inner quadrant of unspecified female breast: Secondary | ICD-10-CM

## 2011-03-19 DIAGNOSIS — Z170 Estrogen receptor positive status [ER+]: Secondary | ICD-10-CM

## 2011-03-19 DIAGNOSIS — M899 Disorder of bone, unspecified: Secondary | ICD-10-CM

## 2011-03-19 LAB — CANCER ANTIGEN 27.29: CA 27.29: 35 U/mL (ref 0–39)

## 2011-03-27 ENCOUNTER — Ambulatory Visit: Payer: Medicare Other | Admitting: Gastroenterology

## 2011-04-11 ENCOUNTER — Ambulatory Visit
Admission: RE | Admit: 2011-04-11 | Discharge: 2011-04-11 | Disposition: A | Discharge status: Home / Self Care | Payer: Medicare Other | Source: Ambulatory Visit | Attending: Oncology | Admitting: Oncology

## 2011-04-11 DIAGNOSIS — Z9889 Other specified postprocedural states: Secondary | ICD-10-CM

## 2011-04-26 NOTE — Cardiovascular Report (Signed)
Michaela Guerra, Michaela Guerra                ACCOUNT NO.:  [id number]   MEDICAL RECORD NO.:  [id number]          PATIENT TYPE:  OIB   LOCATION:  2899                         FACILITY:  [facility]   PHYSICIAN:  Eduardo Osier. Sharyn Lull, M.D. DATE OF BIRTH:  May 28, 1946   DATE OF PROCEDURE:  DATE OF DISCHARGE:  09/04/2006                              CARDIAC CATHETERIZATION   PROCEDURE:  1. Left cardiac cath.  2. Selective left and right coronary angiography.  3. LV graft via the right groin using Judkins technique.   INDICATIONS FOR PROCEDURE:  Michaela Guerra is a 65 year old white female with a  past medical history significant for hypothyroidism, hypertension,  hypercholesteremia, history of CA of breast, degenerative joint disease,  history of polio in the past, complains of retrosternal chest pressure grade  5/10, radiating to the neck and jaw, associated with occasional  palpitations, denies any nausea, vomiting or diaphoresis, denies PND,  orthopnea, leg swelling.  The patient also gives history of exertional  dyspnea, denies weakness in the legs or arms and denies any claudication  pain, denies cough, fever, chills, denies relation of chest pain to food or  breathing.   PAST MEDICAL HISTORY:  As above.   PAST SURGICAL HISTORY:  1. Left breast lumpectomy in 2007 for CA of breast.  2. Right breast benign cyst resection in the past.   ALLERGIES:  SHE IS ALLERGIC TO CODEINE.   MEDICATIONS AT HOME:  She is on:  1. Enteric coated aspirin 81 mg p.o. daily.  2. Toprol XL 25 mg p.o. daily.  3. Nitrostat 0.4 mg sublingual p.r.n.  4. Levothyroxine 15 mcg daily.  5. Nexium 40 mg p.o. daily.  6. Xanax 1 mg b.i.d.  7. Vytorin 10/40 p.o. daily.   SOCIAL HISTORY:  She is single and has __________  .  Smoke three packs per  day for 40 years.  Drinks beer two to three per day.  Worked in [employer], prison and probation services.   FAMILY HISTORY:  Father died of stroke at the age of 66.  Mother died at age  of 39 due to CVA.   One brother is diabetic.  He has heart problems.  One  sister died of CA of breast.   PHYSICAL EXAMINATION:  GENERAL:  She is alert, awake, and oriented x3 in no  acute distress.  VITAL SIGNS:  Blood pressure was 150/94, pulse was 76.  EYES:  Conjunctivae was pink.  NECK:  Supple.  No JVD, no bruit.  LUNG:  Clear to auscultation without rhonchi or rales.  CARDIOVASCULAR:  S1 S2 were normal.  There was a soft systolic murmur.  There is no S3 gallop.  ABDOMEN:  Soft.  Bowel sounds were present, nontender.  EXTREMITIES:  There is no clubbing, cyanosis or edema.   EKG, done in the office, showed normal sinus rhythm with possible old  inferior wall MI.   IMPRESSION:  1. New onset angina, rule out coronary insufficiency.  2. Mild borderline normal EKG.  3. Hypertension.  4. Chronic obstructive pulmonary disease.  5. Tobacco abuse.  6. Hypercholesteremia.  7. Hypothyroidism.  8. Positive family history of coronary artery disease.  9. History of cancer of the breast, status post chemo and radiation.   I discussed with the patient regarding noninvasive stress testing versus  left cath, possible PTCA stenting.  Its risks and benefits i.e. death, MI,  stroke, need for emergency CABG, risk of restenosis, local vascular  complications, __________  consented for the procedure.   PROCEDURE:  After obtaining the informed consent, the patient was brought to  the cath lab and was placed on fluoroscopy table.  The right groin was  prepped and draped in usual fashion.  Xylocaine 2% was used for local  anesthesia in the right groin.  With the help of thin-wall needle, a 6-  French arterial sheath was placed.  The sheath was aspirated and flushed.  Next, a 6-French left Judkins catheter was advanced over the wire under  fluoroscopic guidance up to the ascending aorta.  Wire was pulled out.  The  catheter was aspirated and connected to the manifold.  Catheter was further  advanced and engaged into  left coronary ostium.  Multiple views of the left  system were taken.  Next, the catheter was disengaged and was pulled out  over the wire and was replaced with 6-French right Judkins catheter which  was advanced over the wire under fluoroscopic guidance up to the ascending  aorta.  Wire was pulled out.  The catheter was aspirated and connected to  the manifold.  Catheter was further advanced and engaged into right coronary  ostium.  Multiple views of the right coronary system were taken.  Next, the  catheter was disengaged and was pulled out over the wire and was replaced  with 6-French pigtail catheter which was advanced over the wire under  fluoroscopic guidance up to the ascending aorta.  Wire was pulled out.  The  catheter was aspirated and connected to the manifold.  The catheter was  further advanced across the aortic valve into the LV.  LV pressures were  recorded.  Next, LV graft was done in 30 degrees RAO position.  Post  angiographic pressures were recorded from LV and then pullback pressures  were recorded from the aorta.  There was no gradient across the aortic  valve.  Next, the pigtail catheter was pulled out over the wire.  Sheaths  were aspirated and flushed.   FINDINGS:  1. The LV showed good LV systolic function.  2. EF of 60-70%.  3. The left main was patent.  4. LAD has 15-20% proximal and 10-15% mid stenosis.  5. Diagonal-one has 10-15% proximal stenosis.  It is a long vessel but      small in caliber.  6. Left circumflex is patent.  7. OM-1 is very very small.  8. OM-2 is small which is patent.  9. OM-3 and OM-4 are very very small.  10.Ramus is very small which is patent.  11.RCA is patent.  12.PDA is small which is diffusely diseased distally.  13.PLV branches are also very small.  They are diffusely diseased      distally.   The patient tolerated procedure well.  There were no complications.  The patient was transferred to recovery room in stable  condition.           ______________________________  Eduardo Osier Sharyn Lull, M.D.     MNH/MEDQ  D:  09/04/2006  T:  09/05/2006  Job:  [id number]

## 2011-04-26 NOTE — Op Note (Signed)
NAMEBRAYLIE, Michaela Guerra                ACCOUNT NO.:  [id number]   MEDICAL RECORD NO.:  [id number]          PATIENT TYPE:  AMB   LOCATION:  DSC                          FACILITY:  [facility]   PHYSICIAN:  Timothy E. Earlene Plater, M.D. DATE OF BIRTH:  [birth year]   DATE OF PROCEDURE:  01/20/2006  DATE OF DISCHARGE:                                 OPERATIVE REPORT   PREOPERATIVE DIAGNOSIS:  Carcinoma, left breast.   POSTOPERATIVE DIAGNOSIS:  Carcinoma, left breast.   OPERATION PERFORMED:  Partial mastectomy, left.  Sentinel node biopsies,  left axilla.   SURGEON:  Timothy E. Earlene Plater, M.D.   ASSISTANT:  Ollen Gross. Carolynne Edouard, M.D.   ANESTHESIA:  General.   INDICATIONS FOR PROCEDURE:  Michaela Guerra is 67, has biopsy proven invasive  carcinoma, left breast. There is a small area, upper inner portion, left  breast but work-up is otherwise negative.  Family history is strong and she  is now prepared, ready and informed about the proposed surgery.  She has  seen the left breast and chest marked and the permit signed.  Sulfur colloid  has been administered approximately one hour prior to the start of surgery.   DESCRIPTION OF PROCEDURE:  The patient was taken to the operating room and  placed supine. LMA anesthesia provided.  Methylene blue diluted 3 mL to 5 mL  with saline was injected intradermal and subcuticular around the left nipple  areolar complex and the breast was massaged for five minutes.  Then the  breast was prepped and draped in the usual fashion.  An identifying marker  wire had been placed directed from medial to lateral in the left breast  entering the upper quadrant and by x-ray had penetrated the tumor and beyond  it for about 2 cm.  With the chest and axilla prepped, the NeoProbe was used  to identify a general area in the axilla which was midaxilla.  An incision  was made over the left midaxilla.  Subcutaneous tissue dissected and then  using the NeoProbe as direction, two hot lymph nodes were  found, dissected  free bluntly from the surrounding tissue and submitted at #1 and #2 lymph  nodes.  Survey of the remainder of the axilla was negative and to note,  there was no blue dye in the axilla.  That area was covered and attention  was turned to the breast.  A generous curvilinear incision made over the 12  o'clock position of the breast that being previously marked by x-ray.  The  superficial tissue was dissected and then a generous portion of tissue  surrounding the shaft of the needle was grasped and sharply dissected from  the surrounding breast tissue.  The entire wire and specimen was removed.  The hook of the wire projected from the underside of the specimen.  To note,  this was a large cavity and the posterior margin was the pectoralis fascia.  The anterior margin would have been the superficial subcu of the skin.  The  specimen was marked and sent to x-ray for appropriate localization  technique.  Meanwhile, all bleeding  was controlled with the cautery.  The  wounds were closed in layers with Vicryl and Monocryl with satisfactory  results.   Dr. Almyra Free returned two lymph nodes, left axilla negative for tumor.  Dr.  Villa Herb returned a report adequate excision of needle localized tissue.  Therefore, this procedure was complete.  Counts correct.  She had tolerated  it well.  Steri-Strips applied and dry sterile dressing.  She was removed to  recovery room in good condition.  Written and verbal instructions given  including Percocet #36 and she will be followed in the office.      Timothy E. Earlene Plater, M.D.  Electronically Signed     TED/MEDQ  D:  01/20/2006  T:  01/20/2006  Job:  [id number]   cc:   Alfonse Ras, MD  [address], [address]  [place]  [place] [address]

## 2011-05-09 ENCOUNTER — Other Ambulatory Visit: Payer: Self-pay | Admitting: Oncology

## 2011-05-09 ENCOUNTER — Encounter (HOSPITAL_BASED_OUTPATIENT_CLINIC_OR_DEPARTMENT_OTHER): Payer: Medicare Other | Admitting: Oncology

## 2011-05-09 DIAGNOSIS — C50219 Malignant neoplasm of upper-inner quadrant of unspecified female breast: Secondary | ICD-10-CM

## 2011-05-09 LAB — CBC WITH DIFFERENTIAL/PLATELET
EOS%: 2.9 % (ref 0.0–7.0)
MCH: 30.6 pg (ref 25.1–34.0)
MCV: 89.6 fL (ref 79.5–101.0)
MONO%: 11.5 % (ref 0.0–14.0)
NEUT#: 3.9 10*3/uL (ref 1.5–6.5)
RBC: 4.5 10*6/uL (ref 3.70–5.45)
RDW: 15.2 % — ABNORMAL HIGH (ref 11.2–14.5)

## 2011-05-09 LAB — APTT: aPTT: 29 seconds (ref 24–37)

## 2011-05-09 LAB — PROTHROMBIN TIME: Prothrombin Time: 13 seconds (ref 11.6–15.2)

## 2011-05-14 LAB — VON WILLEBRAND PANEL: Von Willebrand Ag: 91 % (ref 50–217)

## 2011-05-16 ENCOUNTER — Encounter (HOSPITAL_BASED_OUTPATIENT_CLINIC_OR_DEPARTMENT_OTHER): Payer: Medicare Other | Admitting: Oncology

## 2011-05-16 DIAGNOSIS — C50219 Malignant neoplasm of upper-inner quadrant of unspecified female breast: Secondary | ICD-10-CM

## 2011-05-16 DIAGNOSIS — F17200 Nicotine dependence, unspecified, uncomplicated: Secondary | ICD-10-CM

## 2011-05-16 DIAGNOSIS — Z170 Estrogen receptor positive status [ER+]: Secondary | ICD-10-CM

## 2011-05-16 DIAGNOSIS — I10 Essential (primary) hypertension: Secondary | ICD-10-CM

## 2011-07-04 ENCOUNTER — Other Ambulatory Visit: Payer: Self-pay | Admitting: Internal Medicine

## 2011-10-15 LAB — COMPREHENSIVE METABOLIC PANEL
ALT: 13 U/L (ref 0–35)
AST: 13 U/L (ref 0–37)
AST: 13 U/L (ref 0–37)
Albumin: 4 g/dL (ref 3.5–5.2)
Albumin: 4 g/dL (ref 3.5–5.2)
Alkaline Phosphatase: 90 U/L (ref 39–117)
BUN: 9 mg/dL (ref 6–23)
CO2: 23 mEq/L (ref 19–32)
Calcium: 9.4 mg/dL (ref 8.4–10.5)
Chloride: 105 mEq/L (ref 96–112)
Creatinine, Ser: 0.82 mg/dL (ref 0.40–1.20)
Glucose, Bld: 96 mg/dL (ref 70–99)
Potassium: 3.8 mEq/L (ref 3.5–5.3)
Potassium: 3.8 mEq/L (ref 3.5–5.3)
Sodium: 137 mEq/L (ref 135–145)
Total Bilirubin: 0.5 mg/dL (ref 0.3–1.2)
Total Protein: 6.4 g/dL (ref 6.0–8.3)

## 2011-10-15 LAB — CANCER ANTIGEN 27.29: CA 27.29: 37 U/mL (ref 0–39)

## 2011-10-15 LAB — PROTEIN S, ANTIGEN, FREE: Protein S Ag, Free: 89 % normal (ref 50–147)

## 2012-01-13 ENCOUNTER — Other Ambulatory Visit: Payer: Self-pay | Admitting: Internal Medicine

## 2012-01-27 ENCOUNTER — Other Ambulatory Visit: Payer: Self-pay | Admitting: Oncology

## 2012-01-27 DIAGNOSIS — Z853 Personal history of malignant neoplasm of breast: Secondary | ICD-10-CM

## 2012-08-21 ENCOUNTER — Ambulatory Visit
Admission: RE | Admit: 2012-08-21 | Discharge: 2012-08-21 | Disposition: A | Discharge status: Home / Self Care | Payer: Medicare Other | Source: Ambulatory Visit | Attending: Oncology | Admitting: Oncology

## 2012-08-21 DIAGNOSIS — Z853 Personal history of malignant neoplasm of breast: Secondary | ICD-10-CM

## 2012-12-24 ENCOUNTER — Other Ambulatory Visit: Payer: Self-pay | Admitting: *Deleted

## 2012-12-24 NOTE — Progress Notes (Signed)
Michaela Guerra called to this RN stating " I feel like I need to be seen again in your office- I am having some nipple changes like one is introverted ".  Pt has been having routine mammograms.  [facility] and not by a consistant MD that she feels she can trust.  This RN sent request for appt to scheduling.

## 2012-12-25 ENCOUNTER — Telehealth: Payer: Self-pay | Admitting: Oncology

## 2012-12-25 NOTE — Telephone Encounter (Signed)
Called pt, advised md requesting to see her next avail. Gave pt appt for 12/29/12 @ 2pm.

## 2012-12-29 ENCOUNTER — Telehealth: Payer: Self-pay | Admitting: Oncology

## 2012-12-29 ENCOUNTER — Ambulatory Visit (HOSPITAL_BASED_OUTPATIENT_CLINIC_OR_DEPARTMENT_OTHER): Payer: Medicare Other | Admitting: Oncology

## 2012-12-29 VITALS — BP 159/92 | HR 92 | Temp 98.1°F | Resp 20 | Ht 65.0 in | Wt 147.0 lb

## 2012-12-29 DIAGNOSIS — J4489 Other specified chronic obstructive pulmonary disease: Secondary | ICD-10-CM

## 2012-12-29 DIAGNOSIS — I10 Essential (primary) hypertension: Secondary | ICD-10-CM

## 2012-12-29 DIAGNOSIS — Z853 Personal history of malignant neoplasm of breast: Secondary | ICD-10-CM

## 2012-12-29 DIAGNOSIS — N649 Disorder of breast, unspecified: Secondary | ICD-10-CM

## 2012-12-29 DIAGNOSIS — J449 Chronic obstructive pulmonary disease, unspecified: Secondary | ICD-10-CM

## 2012-12-29 DIAGNOSIS — C50919 Malignant neoplasm of unspecified site of unspecified female breast: Secondary | ICD-10-CM

## 2012-12-29 NOTE — Telephone Encounter (Signed)
No f/u order given on 1/21 pof. Per pt none needed. gv pt appt w/Dr. McDiarmid for 1/31 @ 1:15pm. No referral entered. Per 1/21 pof schedule appt w/Dr. McDiarmid.

## 2012-12-29 NOTE — Progress Notes (Signed)
ID: Michaela Guerra   DOB: 1946-10-05  MR#: [id number]  CSN#:[id number]  PCP: Rene Paci, MD GYN:  SU:  OTHER MD: Tawanna Cooler McDiarmid   HISTORY OF PRESENT ILLNESS: The patient originally presented to urgent care with chest discomfort.  Dr. Cornelia Copa took the opportunity of updating the patient's health maintenance by doing a breast exam and he set her up for mammography at [facility], which she had on 11/13/2005.  This showed a 1 cm nodule in the left breast of the upper inner quadrant, which by additional views and ultrasound remained suspicious.  [facility].  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  The patient was then referred to surgery and MRI of both breasts was obtained January 28.  This showed only the solitary malignancy in question and accordingly on January 20, 2006, the patient underwent left lumpectomy and axillary lymph node dissection.  The final pathology ([id number]) showed a 0.6 cm infiltrating ductal carcinoma, grade 1, with 0 of 5 lymph nodes involved.  Her subsequent history is as detailed below  INTERVAL HISTORY:  Michaela Guerra returns for an unscheduled visit. We had release her from followup here, but of course with the understanding that we could always pitch in if necessary. Recently she had noted a change in her breasts she wanted me to look at.   REVIEW OF SYSTEMS: She tells me the left nipple area turned pale and the nipple appeared inverted. This was not associated with pain, rash, fever, or any systemic symptoms. Overall she feels she is doing "pretty good" except for the fact that she has "terrible bladder problems", with constant urgency and significant nocturia. She feels forgetful, bruises easily, and has some hearing loss, but overall a detailed review of systems today was not suggestive of disease  recurrence.  PAST MEDICAL HISTORY: No past medical history on file. Significant for hypertension, history of tobacco abuse with at least early emphysema and likely chronic bronchitis as well, history of borderline low thyroid, history of anemia, history of remote right breast biopsy and a possible history of coagulopathy.  The patient is having heavy periods throughout her life and significant bleeding after dental extraction.  She has had significant swelling after the surgery, but it does not appear that this is blood to be "drained" three times postop so far, although that I believe was mostly serous and not bloody fluid (I do not have those records).   PAST SURGICAL HISTORY: No past surgical history on file.  FAMILY HISTORY No family history on file. The patient's father died from a stroke at age 66, the patient's mother died at the age of 38 from complications of diabetes.  She had a history of breast cancer, which had been diagnosed at age 68.  The patient has one brother and three living sisters, one sister Michaela Guerra) died from breast cancer, diagnoses <40.  GYNECOLOGIC HISTORY: She is GX, P5, first pregnancy age 9, change of life age 23.  She never took hormone replacement.  SOCIAL HISTORY: The patient is unemployed, single and lives by herself. She has 5 daughters, one grandson with nephrotic syndrome, another with hemophilia   ADVANCED DIRECTIVES: not in place  HEALTH MAINTENANCE: History  Substance Use Topics  . Smoking status: Not on file  . Smokeless tobacco: Not on file  . Alcohol Use: Not on file  Colonoscopy:  PAP:  Bone density:  Lipid panel:  Allergies  Allergen Reactions  . Codeine     REACTION: causes skin to feel like it's crawling  . Levofloxacin     REACTION: nausea    Current Outpatient Prescriptions  Medication Sig Dispense Refill  . levothyroxine (SYNTHROID, LEVOTHROID) 88 MCG tablet TAKE ONE TABLET BY MOUTH EVERY DAY  90 tablet  1     OBJECTIVE: Middle-aged white woman who appears anxious Filed Vitals:   12/29/12 1334  BP: 159/92  Pulse: 92  Temp: 98.1 F (36.7 C)  Resp: 20     Body mass index is 24.46 kg/(m^2).    ECOG FS:1 Sclerae unicteric Oropharynx clear No cervical or supraclavicular adenopathy Lungs no rales or rhonchi Heart regular rate and rhythm Abd benign MSK no focal spinal tenderness, no peripheral edema Neuro: nonfocal, well oriented Breasts: The right breast is unremarkable. The left breast is status post lumpectomy and radiation. There is currently no nipple inversion, and no discoloration of the left breast. Palpation of the breast shows no suspicious masses, and the left axilla is benign.   LAB RESULTS: Lab Results  Component Value Date   WBC 7.3 05/09/2011   NEUTROABS 3.9 05/09/2011   HGB 13.7 05/09/2011   HCT 40.3 05/09/2011   MCV 89.6 05/09/2011   PLT 283 05/09/2011      Chemistry      Component Value Date/Time   NA 137 05/09/2011 1253   NA 137 05/09/2011 1253   NA 137 05/09/2011 1253   NA 137 05/09/2011 1253   K 3.8 05/09/2011 1253   K 3.8 05/09/2011 1253   K 3.8 05/09/2011 1253   K 3.8 05/09/2011 1253   CL 105 05/09/2011 1253   CL 105 05/09/2011 1253   CL 105 05/09/2011 1253   CL 105 05/09/2011 1253   CO2 23 05/09/2011 1253   CO2 23 05/09/2011 1253   CO2 23 05/09/2011 1253   CO2 23 05/09/2011 1253   BUN 9 05/09/2011 1253   BUN 9 05/09/2011 1253   BUN 9 05/09/2011 1253   BUN 9 05/09/2011 1253   CREATININE 0.82 05/09/2011 1253   CREATININE 0.82 05/09/2011 1253   CREATININE 0.82 05/09/2011 1253   CREATININE 0.82 05/09/2011 1253      Component Value Date/Time   CALCIUM 9.4 05/09/2011 1253   CALCIUM 9.4 05/09/2011 1253   CALCIUM 9.4 05/09/2011 1253   CALCIUM 9.4 05/09/2011 1253   ALKPHOS 90 05/09/2011 1253   ALKPHOS 90 05/09/2011 1253   ALKPHOS 90 05/09/2011 1253   ALKPHOS 90 05/09/2011 1253   AST 13 05/09/2011 1253   AST 13 05/09/2011 1253   AST 13 05/09/2011 1253   AST 13 05/09/2011 1253    ALT 13 05/09/2011 1253   ALT 13 05/09/2011 1253   ALT 13 05/09/2011 1253   ALT 13 05/09/2011 1253   BILITOT 0.5 05/09/2011 1253   BILITOT 0.5 05/09/2011 1253   BILITOT 0.5 05/09/2011 1253   BILITOT 0.5 05/09/2011 1253       Lab Results  Component Value Date   LABCA2 37 05/09/2011   LABCA2 37 05/09/2011   LABCA2 37 05/09/2011   LABCA2 37 05/09/2011   LABCA2 37 05/09/2011    No components found with this basename: LABCA125    No results found for this basename: INR:1;PROTIME:1 in the last 168 hours  Urinalysis    Component Value Date/Time   COLORURINE YELLOW 01/28/2011 1043   APPEARANCEUR CLEAR 01/28/2011 1043  LABSPEC 1.011 01/28/2011 1043   LABSPEC 1.010 03/16/2007 1434   PHURINE 7.5 01/28/2011 1043   HGBUR NEGATIVE 01/28/2011 1043   HGBUR negative 01/24/2010 1025   BILIRUBINUR NEGATIVE 01/28/2011 1043   KETONESUR NEGATIVE 01/28/2011 1043   PROTEINUR NEGATIVE 01/28/2011 1043   UROBILINOGEN 0.2 01/28/2011 1043   NITRITE NEGATIVE 01/28/2011 1043   LEUKOCYTESUR NEGATIVE MICROSCOPIC NOT DONE ON URINES WITH NEGATIVE PROTEIN, BLOOD, LEUKOCYTES, NITRITE, OR GLUCOSE <1000 mg/dL. 01/28/2011 1043    STUDIES: No results found.  ASSESSMENT: 67 y.o. BRCA negative [place] woman. 1. Status post left lumpectomy and sentinel lymph node biopsy February 2007 for a T1b N0 estrogen and progesterone receptor positive, HER2 negative invasive ductal carcinoma, treated with radiation adjuvantly, then Arimidex briefly, then Aromasin for approximately 18 months, then tamoxifen started late 2009 (however with the patient never filling that prescription). 2. History of splenic infarct/abdominal pain with an unremarkable coagulation panel. Free protein S activity during that hospitalization has been repeated and is currently normal. 3.     She has multiple comorbidities including hypertension,  hypothyroidism, hyperlipidemia, continuing tobacco   abuse and chronic obstructive pulmonary disease   PLAN: Sydne is  doing well overall, with no evidence of disease recurrence, and I have not made her a routine return appointment here, although again, we will be glad to see her if and when the need arises. I reassured her that the changes she saw her breast or transient, possibly due to minor unnoticed trauma or 2 a temporarily blocked duct. At any rate those changes have resolved. All she needs is to continue her yearly mammography, which is due in September of this year  On the other hand I think she might benefit from urologic evaluation of her urinary frequency and nocturia. I have referred her to Dr. Sherron Monday for further evaluation.   Marthe Dant C    12/29/2012

## 2013-07-14 ENCOUNTER — Telehealth: Payer: Self-pay | Admitting: Oncology

## 2013-07-14 NOTE — Telephone Encounter (Signed)
Faxed pt medical records to [facility] °

## 2013-11-08 DIAGNOSIS — I639 Cerebral infarction, unspecified: Secondary | ICD-10-CM

## 2013-11-08 HISTORY — DX: Cerebral infarction, unspecified: I63.9

## 2013-11-13 ENCOUNTER — Encounter (HOSPITAL_COMMUNITY): Payer: Self-pay | Admitting: Emergency Medicine

## 2013-11-13 ENCOUNTER — Inpatient Hospital Stay (HOSPITAL_COMMUNITY)
Admission: EM | Admit: 2013-11-13 | Discharge: 2013-11-15 | DRG: 066 | Disposition: A | Discharge status: Home Health | Payer: Medicare Other | Attending: Family Medicine | Admitting: Family Medicine

## 2013-11-13 ENCOUNTER — Emergency Department (HOSPITAL_COMMUNITY): Payer: Medicare Other

## 2013-11-13 DIAGNOSIS — F411 Generalized anxiety disorder: Secondary | ICD-10-CM

## 2013-11-13 DIAGNOSIS — I6350 Cerebral infarction due to unspecified occlusion or stenosis of unspecified cerebral artery: Principal | ICD-10-CM

## 2013-11-13 DIAGNOSIS — D126 Benign neoplasm of colon, unspecified: Secondary | ICD-10-CM

## 2013-11-13 DIAGNOSIS — Z860100 Personal history of colon polyps, unspecified: Secondary | ICD-10-CM

## 2013-11-13 DIAGNOSIS — I639 Cerebral infarction, unspecified: Secondary | ICD-10-CM | POA: Diagnosis present

## 2013-11-13 DIAGNOSIS — E785 Hyperlipidemia, unspecified: Secondary | ICD-10-CM

## 2013-11-13 DIAGNOSIS — R5381 Other malaise: Secondary | ICD-10-CM

## 2013-11-13 DIAGNOSIS — R32 Unspecified urinary incontinence: Secondary | ICD-10-CM

## 2013-11-13 DIAGNOSIS — C50919 Malignant neoplasm of unspecified site of unspecified female breast: Secondary | ICD-10-CM

## 2013-11-13 DIAGNOSIS — D509 Iron deficiency anemia, unspecified: Secondary | ICD-10-CM

## 2013-11-13 DIAGNOSIS — J4489 Other specified chronic obstructive pulmonary disease: Secondary | ICD-10-CM | POA: Diagnosis present

## 2013-11-13 DIAGNOSIS — F17200 Nicotine dependence, unspecified, uncomplicated: Secondary | ICD-10-CM

## 2013-11-13 DIAGNOSIS — M899 Disorder of bone, unspecified: Secondary | ICD-10-CM

## 2013-11-13 DIAGNOSIS — E039 Hypothyroidism, unspecified: Secondary | ICD-10-CM

## 2013-11-13 DIAGNOSIS — M129 Arthropathy, unspecified: Secondary | ICD-10-CM

## 2013-11-13 DIAGNOSIS — F329 Major depressive disorder, single episode, unspecified: Secondary | ICD-10-CM

## 2013-11-13 DIAGNOSIS — F3289 Other specified depressive episodes: Secondary | ICD-10-CM

## 2013-11-13 DIAGNOSIS — I10 Essential (primary) hypertension: Secondary | ICD-10-CM | POA: Diagnosis present

## 2013-11-13 DIAGNOSIS — I493 Ventricular premature depolarization: Secondary | ICD-10-CM

## 2013-11-13 DIAGNOSIS — J309 Allergic rhinitis, unspecified: Secondary | ICD-10-CM

## 2013-11-13 DIAGNOSIS — Z853 Personal history of malignant neoplasm of breast: Secondary | ICD-10-CM

## 2013-11-13 DIAGNOSIS — L570 Actinic keratosis: Secondary | ICD-10-CM

## 2013-11-13 DIAGNOSIS — F1010 Alcohol abuse, uncomplicated: Secondary | ICD-10-CM

## 2013-11-13 DIAGNOSIS — E876 Hypokalemia: Secondary | ICD-10-CM | POA: Diagnosis present

## 2013-11-13 DIAGNOSIS — J449 Chronic obstructive pulmonary disease, unspecified: Secondary | ICD-10-CM | POA: Diagnosis present

## 2013-11-13 DIAGNOSIS — H9190 Unspecified hearing loss, unspecified ear: Secondary | ICD-10-CM

## 2013-11-13 DIAGNOSIS — K589 Irritable bowel syndrome without diarrhea: Secondary | ICD-10-CM

## 2013-11-13 DIAGNOSIS — K219 Gastro-esophageal reflux disease without esophagitis: Secondary | ICD-10-CM

## 2013-11-13 DIAGNOSIS — Z8673 Personal history of transient ischemic attack (TIA), and cerebral infarction without residual deficits: Secondary | ICD-10-CM

## 2013-11-13 HISTORY — DX: Essential (primary) hypertension: I10

## 2013-11-13 HISTORY — DX: Gastro-esophageal reflux disease without esophagitis: K21.9

## 2013-11-13 HISTORY — DX: Cerebral infarction, unspecified: I63.9

## 2013-11-13 HISTORY — DX: Malignant neoplasm of unspecified site of unspecified female breast: C50.919

## 2013-11-13 HISTORY — DX: Hypothyroidism, unspecified: E03.9

## 2013-11-13 HISTORY — DX: Chronic obstructive pulmonary disease, unspecified: J44.9

## 2013-11-13 LAB — URINALYSIS, ROUTINE W REFLEX MICROSCOPIC
Glucose, UA: NEGATIVE mg/dL
Hgb urine dipstick: NEGATIVE
Ketones, ur: NEGATIVE mg/dL
Nitrite: NEGATIVE
Protein, ur: NEGATIVE mg/dL

## 2013-11-13 LAB — COMPREHENSIVE METABOLIC PANEL
AST: 13 U/L (ref 0–37)
Albumin: 3.7 g/dL (ref 3.5–5.2)
BUN: 11 mg/dL (ref 6–23)
Calcium: 9.7 mg/dL (ref 8.4–10.5)
Chloride: 103 mEq/L (ref 96–112)
Creatinine, Ser: 0.72 mg/dL (ref 0.50–1.10)
Total Protein: 6.9 g/dL (ref 6.0–8.3)

## 2013-11-13 LAB — CBC WITH DIFFERENTIAL/PLATELET
Basophils Absolute: 0 10*3/uL (ref 0.0–0.1)
Eosinophils Absolute: 0.1 10*3/uL (ref 0.0–0.7)
Eosinophils Relative: 2 % (ref 0–5)
HCT: 44.9 % (ref 36.0–46.0)
Hemoglobin: 15.4 g/dL — ABNORMAL HIGH (ref 12.0–15.0)
Lymphs Abs: 2.4 10*3/uL (ref 0.7–4.0)
MCH: 31.7 pg (ref 26.0–34.0)
MCV: 92.4 fL (ref 78.0–100.0)
Monocytes Absolute: 0.5 10*3/uL (ref 0.1–1.0)
Monocytes Relative: 6 % (ref 3–12)
RBC: 4.86 MIL/uL (ref 3.87–5.11)

## 2013-11-13 MED ORDER — HYDRALAZINE HCL 20 MG/ML IJ SOLN: 10.0000 mg | INTRAMUSCULAR | Status: DC | PRN

## 2013-11-13 MED ORDER — SENNOSIDES-DOCUSATE SODIUM 8.6-50 MG PO TABS: 1.0000 | ORAL_TABLET | Freq: Every evening | ORAL | Status: DC | PRN

## 2013-11-13 MED ORDER — NICOTINE 21 MG/24HR TD PT24
21.0000 mg | MEDICATED_PATCH | Freq: Once | TRANSDERMAL | Status: AC
  Administered 2013-11-13: 21 mg via TRANSDERMAL
  Filled 2013-11-13: qty 1

## 2013-11-13 MED ORDER — ACETAMINOPHEN 650 MG RE SUPP: 650.0000 mg | RECTAL | Status: DC | PRN

## 2013-11-13 MED ORDER — ASPIRIN 325 MG PO TABS
325.0000 mg | ORAL_TABLET | Freq: Every day | ORAL | Status: DC
  Administered 2013-11-14: 325 mg via ORAL
  Filled 2013-11-13: qty 1

## 2013-11-13 MED ORDER — SODIUM CHLORIDE 0.9 % IV SOLN
INTRAVENOUS | Status: DC
  Administered 2013-11-13: 14:00:00 via INTRAVENOUS

## 2013-11-13 MED ORDER — ACETAMINOPHEN 325 MG PO TABS: 650.0000 mg | ORAL_TABLET | ORAL | Status: DC | PRN

## 2013-11-13 MED ORDER — ASPIRIN 300 MG RE SUPP
300.0000 mg | Freq: Every day | RECTAL | Status: DC
  Filled 2013-11-13: qty 1

## 2013-11-13 MED ORDER — SODIUM CHLORIDE 0.9 % IV SOLN
INTRAVENOUS | Status: DC
  Administered 2013-11-13: 22:00:00 via INTRAVENOUS

## 2013-11-13 NOTE — Consult Note (Signed)
Referring Physician: Adela Glimpse     Chief Complaint: Difficulty with gait  HPI: SHANDON MATSON is an 67 y.o. female who reports that she awakened on Tuesday and noticed that she was having difficulty with gait.  She also noticed some difficulty with her right side specifically.  Her arm has gotten a little better but with the majority of her symptoms still present today she presented for medical evaluation.  She has not experienced paresthesias.    Date last known well: Date: 11/08/2013 Time last known well: Time: 23:00 tPA Given: No: Outside time window  Past Medical History  Diagnosis Date  . Thyroid disease   . Cancer   . Hypertension   . COPD (chronic obstructive pulmonary disease)   . Stroke   . GERD (gastroesophageal reflux disease)     Past Surgical History  Procedure Laterality Date  . Breast surgery      Family History  Problem Relation Age of Onset  . Breast cancer Mother   . Stroke Mother   . Stroke Father   . Breast cancer Sister   . Hypertension Sister   . Hypertension Sister   . Dementia Sister    Social History:  reports that she has been smoking.  She does not have any smokeless tobacco history on file. She reports that she drinks alcohol. She reports that she does not use illicit drugs.  Allergies:  Allergies  Allergen Reactions  . Codeine     REACTION: causes skin to feel like it's crawling  . Levofloxacin     REACTION: nausea    Medications:  I have reviewed the patient's current medications. Prior to Admission:  No prescriptions prior to admission   Scheduled: . nicotine  21 mg Transdermal Once    ROS: History obtained from the patient  General ROS: negative for - chills, fatigue, fever, night sweats, weight gain or weight loss Psychological ROS: depression Ophthalmic ROS: negative for - blurry vision, double vision, eye pain or loss of vision ENT ROS: negative for - epistaxis, nasal discharge, oral lesions, sore throat, tinnitus or  vertigo Allergy and Immunology ROS: negative for - hives or itchy/watery eyes Hematological and Lymphatic ROS: negative for - bleeding problems, bruising or swollen lymph nodes Endocrine ROS: negative for - galactorrhea, hair pattern changes, polydipsia/polyuria or temperature intolerance Respiratory ROS: cough Cardiovascular ROS: negative for - chest pain, dyspnea on exertion, edema or irregular heartbeat Gastrointestinal ROS: negative for - abdominal pain, diarrhea, hematemesis, nausea/vomiting or stool incontinence Genito-Urinary ROS: negative for - dysuria, hematuria, incontinence or urinary frequency/urgency Musculoskeletal ROS: negative for - joint swelling or muscular weakness Neurological ROS: as noted in HPI Dermatological ROS: negative for rash and skin lesion changes  Physical Examination: Blood pressure 151/93, pulse 74, temperature 98.8 F (37.1 C), temperature source Oral, resp. rate 22, height 5\' 5"  (1.651 m), weight 69.854 kg (154 lb), SpO2 92.00%.  Neurologic Examination: Mental Status: Alert, oriented, thought content appropriate.  Speech fluent without evidence of aphasia.  Able to follow 3 step commands without difficulty. Cranial Nerves: II: Discs flat bilaterally; Visual fields grossly normal, pupils equal, round, reactive to light and accommodation III,IV, VI: ptosis not present, extra-ocular motions intact bilaterally V,VII: decrease in right NLF, facial light touch sensation normal bilaterally VIII: hearing normal bilaterally IX,X: gag reflex present XI: bilateral shoulder shrug XII: midline tongue extension Motor: Right : Upper extremity   5/5    Left:     Upper extremity   4+/5 with pronator drift  Lower extremity   5/5     Lower extremity   5-/5 Tone and bulk:normal tone throughout; no atrophy noted Sensory: Pinprick and light touch intact throughout, bilaterally Deep Tendon Reflexes: 2+ and symmetric with 1+ AJ's bilaterally Plantars: Right:  upgoing   Left: upgoing Cerebellar: normal finger-to-nose, dysmetria with hell to shin on the right Gait: Unable to test CV: pulses palpable throughout     Laboratory Studies:  Basic Metabolic Panel:  Recent Labs Lab 11/13/13 1334  NA 141  K 3.3*  CL 103  CO2 26  GLUCOSE 137*  BUN 11  CREATININE 0.72  CALCIUM 9.7    Liver Function Tests:  Recent Labs Lab 11/13/13 1334  AST 13  ALT 13  ALKPHOS 79  BILITOT 0.7  PROT 6.9  ALBUMIN 3.7   No results found for this basename: LIPASE, AMYLASE,  in the last 168 hours No results found for this basename: AMMONIA,  in the last 168 hours  CBC:  Recent Labs Lab 11/13/13 1334  WBC 8.0  NEUTROABS 5.0  HGB 15.4*  HCT 44.9  MCV 92.4  PLT 262    Cardiac Enzymes: No results found for this basename: CKTOTAL, CKMB, CKMBINDEX, TROPONINI,  in the last 168 hours  BNP: No components found with this basename: POCBNP,   CBG: No results found for this basename: GLUCAP,  in the last 168 hours  Microbiology: Results for orders placed during the hospital encounter of 01/28/11  URINE CULTURE     Status: None   Collection Time    01/29/11 12:47 AM      Result Value Range Status   Specimen Description URINE, RANDOM   Final   Special Requests IMMUNE:NORM UT SYMPT:NEG   Final   Culture  Setup Time [id number]   Final   Colony Count NO GROWTH   Final   Culture NO GROWTH   Final   Report Status 01/30/2011 FINAL   Final  CULTURE, BLOOD (ROUTINE X 2)     Status: None   Collection Time    01/29/11  5:40 AM      Result Value Range Status   Specimen Description BLOOD RIGHT WRIST   Final   Special Requests BOTTLES DRAWN AEROBIC AND ANAEROBIC 5CC EA   Final   Culture  Setup Time [id number]   Final   Culture NO GROWTH 5 DAYS   Final   Report Status 02/04/2011 FINAL   Final  CULTURE, BLOOD (ROUTINE X 2)     Status: None   Collection Time    01/29/11  5:45 AM      Result Value Range Status   Specimen Description BLOOD RIGHT  ANTECUBITAL   Final   Special Requests BOTTLES DRAWN AEROBIC AND ANAEROBIC 5CC EA   Final   Culture  Setup Time [id number]   Final   Culture NO GROWTH 5 DAYS   Final   Report Status 02/04/2011 FINAL   Final    Coagulation Studies: No results found for this basename: LABPROT, INR,  in the last 72 hours  Urinalysis:  Recent Labs Lab 11/13/13 1450  COLORURINE YELLOW  LABSPEC 1.012  PHURINE 7.0  GLUCOSEU NEGATIVE  HGBUR NEGATIVE  BILIRUBINUR NEGATIVE  KETONESUR NEGATIVE  PROTEINUR NEGATIVE  UROBILINOGEN 0.2  NITRITE NEGATIVE  LEUKOCYTESUR NEGATIVE    Lipid Panel:    Component Value Date/Time   CHOL 154 12/29/2009 2146   TRIG 77 12/29/2009 2146   HDL 54 12/29/2009 2146   CHOLHDL 2.9 Ratio 12/29/2009 2146  VLDL 15 12/29/2009 2146   LDLCALC 85 12/29/2009 2146    HgbA1C:  No results found for this basename: HGBA1C    Urine Drug Screen:   No results found for this basename: labopia, cocainscrnur, labbenz, amphetmu, thcu, labbarb    Alcohol Level: No results found for this basename: ETH,  in the last 168 hours  Other results: EKG: sinus rhythm at 79 bpm.  Imaging: Dg Chest 2 View  11/13/2013   CLINICAL DATA:  Knee instability.  Sinus congestion.  Smoker.  EXAM: CHEST  2 VIEW  COMPARISON:  02/07/2010 and chest CT dated 01/29/2011.  FINDINGS: The heart remains normal in size. The lungs are mildly progressively hyperexpanded with mildly progressive diffuse peribronchial thickening and accentuation of the interstitial markings. Diffuse osteopenia.  IMPRESSION: Progressive changes of COPD and chronic bronchitis. No acute abnormality.   Electronically Signed   By: Gordan Payment M.D.   On: 11/13/2013 14:47   Ct Head Wo Contrast  11/13/2013   CLINICAL DATA:  Right-sided weakness since 11/08/2013.  EXAM: CT HEAD WITHOUT CONTRAST  TECHNIQUE: Contiguous axial images were obtained from the base of the skull through the vertex without intravenous contrast.  COMPARISON:  05/29/2007   FINDINGS: No mass lesion. No midline shift. No acute hemorrhage or hematoma. No extra-axial fluid collections. No evidence of acute infarction. There is fairly extensive periventricular white matter lucency consistent with chronic small vessel ischemic disease, slightly progressed since the prior study. Osseous structures are normal and unchanged.  IMPRESSION: No acute intracranial abnormality. Extensive chronic small vessel ischemic changes, slightly progressed.   Electronically Signed   By: Geanie Cooley M.D.   On: 11/13/2013 14:23   Mr Maxine Glenn Head Wo Contrast  11/13/2013   CLINICAL DATA:  Right-sided weakness. Abnormal gait. Slurred speech.  EXAM: MRI HEAD WITHOUT CONTRAST  MRA HEAD WITHOUT CONTRAST  TECHNIQUE: Multiplanar, multiecho pulse sequences of the brain and surrounding structures were obtained without intravenous contrast. Angiographic images of the head were obtained using MRA technique without contrast.  COMPARISON:  11/13/2013 and 05/29/2007 CT.  FINDINGS: MRI HEAD FINDINGS  Acute nonhemorrhagic infarct posterior limb left internal capsule.  Prominent small vessel disease type changes.  Left calvarial 1 cm lucency may represent a hemangioma (unchanged by CT since 2008). Otherwise no evidence of intracranial mass lesion noted on this unenhanced exam.  Cerebellar tonsils minimally low lying but within the range of normal limits.  Pituitary region, pineal region and orbital structures unremarkable.  MRA HEAD FINDINGS  Anterior circulation without medium or large size vessel significant stenosis or occlusion.  Fetal type contribution to the posterior circulation bilaterally.  Ectasia of the anterior communicating artery region without saccular aneurysm noted.  Middle cerebral artery Mild branch vessel irregularity bilaterally.  Right vertebral artery is dominant. No significant stenosis of the vertebral arteries or basilar artery.  Mild irregularity of the posterior inferior cerebellar artery bilaterally.   Non visualization of the anterior inferior cerebellar artery bilaterally.  Bulbous appearance of the basilar tip from which superior cerebral arteries and posterior cerebral arteries arise without discrete saccular aneurysm.  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  Prominent small vessel disease type changes.  Mild intracranial atherosclerotic type changes as detailed above.  These results were called by telephone at the time of interpretation on 11/13/2013 at 7:13 PM to Dr. Vanetta Mulders , who verbally acknowledged these results.   Electronically Signed   By: Bridgett Larsson M.D.   On: 11/13/2013 19:29   Mr  Brain Wo Contrast  11/13/2013   CLINICAL DATA:  Right-sided weakness. Abnormal gait. Slurred speech.  EXAM: MRI HEAD WITHOUT CONTRAST  MRA HEAD WITHOUT CONTRAST  TECHNIQUE: Multiplanar, multiecho pulse sequences of the brain and surrounding structures were obtained without intravenous contrast. Angiographic images of the head were obtained using MRA technique without contrast.  COMPARISON:  11/13/2013 and 05/29/2007 CT.  FINDINGS: MRI HEAD FINDINGS  Acute nonhemorrhagic infarct posterior limb left internal capsule.  Prominent small vessel disease type changes.  Left calvarial 1 cm lucency may represent a hemangioma (unchanged by CT since 2008). Otherwise no evidence of intracranial mass lesion noted on this unenhanced exam.  Cerebellar tonsils minimally low lying but within the range of normal limits.  Pituitary region, pineal region and orbital structures unremarkable.  MRA HEAD FINDINGS  Anterior circulation without medium or large size vessel significant stenosis or occlusion.  Fetal type contribution to the posterior circulation bilaterally.  Ectasia of the anterior communicating artery region without saccular aneurysm noted.  Middle cerebral artery Mild branch vessel irregularity bilaterally.  Right vertebral artery is dominant. No significant stenosis of the  vertebral arteries or basilar artery.  Mild irregularity of the posterior inferior cerebellar artery bilaterally.  Non visualization of the anterior inferior cerebellar artery bilaterally.  Bulbous appearance of the basilar tip from which superior cerebral arteries and posterior cerebral arteries arise without discrete saccular aneurysm.  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  Prominent small vessel disease type changes.  Mild intracranial atherosclerotic type changes as detailed above.  These results were called by telephone at the time of interpretation on 11/13/2013 at 7:13 PM to Dr. Vanetta Mulders , who verbally acknowledged these results.   Electronically Signed   By: Bridgett Larsson M.D.   On: 11/13/2013 19:29   Dg Knee Complete 4 Views Right  11/13/2013   CLINICAL DATA:  Right knee instability.  Previous knee surgery.  EXAM: RIGHT KNEE - COMPLETE 4+ VIEW  COMPARISON:  02/13/2009 and MR dated 04/18/2009.  FINDINGS: Mild lateral joint space narrowing. No fracture or effusion. The bones appear osteopenic.  IMPRESSION: No acute abnormality.   Electronically Signed   By: Gordan Payment M.D.   On: 11/13/2013 14:46    Assessment: 67 y.o. female presenting with a four day history of difficulty with gait and right sided weakness.  MRI of the brain has been reviewed and shows an acute infarct in the posterior limb of the left internal capsule.  MRA shows no hemodynamically significant stenosis.  Patient is currently on no antiplatelet therapy.  Further work up recommended.  Stroke Risk Factors - hypertension and smoking  Plan: 1. HgbA1c, fasting lipid panel 3. PT consult, OT consult, Speech consult 3. Echocardiogram 4. Carotid dopplers 5. Prophylactic therapy-Antiplatelet med: Aspirin - dose 325mg  daily 6. Smoking cessation counseling 7. Telemetry monitoring 8. Frequent neuro checks   Thana Farr, MD [facility] [phone] 11/13/2013, 9:31  PM

## 2013-11-13 NOTE — ED Notes (Signed)
Per pt sts Tuesday she woke up and noticed that she was unsteady on her feet but no dizzy or lightheaded. sts that her right side was weaker  And then she began having spasms in right arm and leg.

## 2013-11-13 NOTE — H&P (Addendum)
PCP: Dr. Kateri Plummer with [facility]   Chief Complaint:  Right side weakness  HPI: Michaela Guerra is a 67 y.o. female   has a past medical history of Thyroid disease; Cancer; Hypertension; COPD (chronic obstructive pulmonary disease); Stroke; and GERD (gastroesophageal reflux disease).   Presented with  Patient woke up 5 days ago with ataxia and sl;ight weakness of right arm and foot she denies any other problems associated with that at the time. She thought it would get better but it did not. She presented to ER this AM and had an MRI doe that showed new nonhemorrhagic infarct posterior limb left internal capsule. Neuro hospitalist was made aware. Hospitalist called for admission. Patient states she stopped taking all of her medications 6 months ago she ran out of her medications. She felt better off her synthroid and endocrinology told her to stay off. She has also stopped her statins and antihypertensives as well.     Review of Systems:   Pertinent positives include: weakness on the right,  Constitutional:  No weight loss, night sweats, Fevers, chills, fatigue, weight loss  HEENT:  No headaches, Difficulty swallowing,Tooth/dental problems,Sore throat,  No sneezing, itching, ear ache, nasal congestion, post nasal drip,  Cardio-vascular:  No chest pain, Orthopnea, PND, anasarca, dizziness, palpitations.no Bilateral lower extremity swelling  GI:  No heartburn, indigestion, abdominal pain, nausea, vomiting, diarrhea, change in bowel habits, loss of appetite, melena, blood in stool, hematemesis Resp:  no shortness of breath at rest. No dyspnea on exertion, No excess mucus, no productive cough, No non-productive cough, No coughing up of blood.No change in color of mucus.No wheezing. Skin:  no rash or lesions. No jaundice GU:  no dysuria, change in color of urine, no urgency or frequency. No straining to urinate.  No flank pain.  Musculoskeletal:  No joint pain or no joint swelling. No decreased  range of motion. No back pain.  Psych:  No change in mood or affect. No depression or anxiety. No memory loss.  Neuro:   no double vision, no gait abnormality, no slurred speech, no confusion  Otherwise ROS are negative except for above, 10 systems were reviewed  Past Medical History: Past Medical History  Diagnosis Date  . Thyroid disease   . Cancer   . Hypertension   . COPD (chronic obstructive pulmonary disease)   . Stroke   . GERD (gastroesophageal reflux disease)    Past Surgical History  Procedure Laterality Date  . Breast surgery       Medications: Prior to Admission medications   Not on File    Allergies:   Allergies  Allergen Reactions  . Codeine     REACTION: causes skin to feel like it's crawling  . Levofloxacin     REACTION: nausea    Social History:  Ambulatory   Independently  Lives at Independent living at [facility] alone   reports that she has been smoking.  She does not have any smokeless tobacco history on file. She reports that she drinks alcohol. She reports that she does not use illicit drugs.   Family History: family history includes Breast cancer in her mother and sister; Dementia in her sister; Hypertension in her sister and sister; Stroke in her father and mother.    Physical Exam: Patient Vitals for the past 24 hrs:  BP Temp Temp src Pulse Resp SpO2 Height Weight  11/13/13 2030 151/93 mmHg - - 74 - 92 % - -  11/13/13 2002 163/83 mmHg - - 72 -  93 % - -  11/13/13 1930 148/89 mmHg - - 77 22 94 % - -  11/13/13 1900 167/79 mmHg - - 78 30 93 % - -  11/13/13 1830 158/91 mmHg - - 74 18 93 % - -  11/13/13 1810 158/90 mmHg - - 76 19 94 % - -  11/13/13 1705 148/84 mmHg - - 71 24 95 % - -  11/13/13 1536 - 98.8 F (37.1 C) - - - - - -  11/13/13 1456 162/94 mmHg - - 86 - 97 % - -  11/13/13 1345 171/82 mmHg - - 82 22 97 % - -  11/13/13 1330 161/81 mmHg - - 84 22 94 % - -  11/13/13 1309 184/87 mmHg 98.7 F (37.1 C) Oral 85 14 96 % - -   11/13/13 1246 157/88 mmHg 97.8 F (36.6 C) Oral 87 20 99 % 5\' 5"  (1.651 m) 69.854 kg (154 lb)    1. General:  in No Acute distress 2. Psychological: Alert and   Oriented 3. Head/ENT:   Moist   Mucous Membranes                          Head Non traumatic, neck supple                          Normal  Dentition 4. SKIN: normal   Skin turgor,  Skin clean Dry and intact no rash 5. Heart: Regular rate and rhythm no Murmur, Rub or gallop 6. Lungs: Clear to auscultation bilaterally, no wheezes or crackles   7. Abdomen: Soft, non-tender, Non distended 8. Lower extremities: no clubbing, cyanosis, or edema 9. Neurologically diminished strength on Right upper and lower ext. Right pronator drift, Slight right facial droop 10. MSK: Normal range of motion  body mass index is 25.63 kg/(m^2).   Labs on Admission:   Recent Labs  11/13/13 1334  NA 141  K 3.3*  CL 103  CO2 26  GLUCOSE 137*  BUN 11  CREATININE 0.72  CALCIUM 9.7    Recent Labs  11/13/13 1334  AST 13  ALT 13  ALKPHOS 79  BILITOT 0.7  PROT 6.9  ALBUMIN 3.7   No results found for this basename: LIPASE, AMYLASE,  in the last 72 hours  Recent Labs  11/13/13 1334  WBC 8.0  NEUTROABS 5.0  HGB 15.4*  HCT 44.9  MCV 92.4  PLT 262   No results found for this basename: CKTOTAL, CKMB, CKMBINDEX, TROPONINI,  in the last 72 hours No results found for this basename: TSH, T4TOTAL, FREET3, T3FREE, THYROIDAB,  in the last 72 hours No results found for this basename: VITAMINB12, FOLATE, FERRITIN, TIBC, IRON, RETICCTPCT,  in the last 72 hours No results found for this basename: HGBA1C    Estimated Creatinine Clearance: 66.1 ml/min (by C-G formula based on Cr of 0.72). ABG    Component Value Date/Time   TCO2 28 03/11/2010 1604     No results found for this basename: DDIMER     Other results:  I have pearsonaly reviewed this: ECG REPORT  Rate: 90  Rhythm: NSR with fusion complex ST&T Change: no evidence of  ischemia  UA WNL   Cultures:    Component Value Date/Time   SDES BLOOD RIGHT ANTECUBITAL 01/29/2011 0545   SPECREQUEST BOTTLES DRAWN AEROBIC AND ANAEROBIC 5CC EA 01/29/2011 0545   CULT NO GROWTH 5 DAYS 01/29/2011 0545  REPTSTATUS 02/04/2011 FINAL 01/29/2011 0545       Radiological Exams on Admission: Dg Chest 2 View  11/13/2013   CLINICAL DATA:  Knee instability.  Sinus congestion.  Smoker.  EXAM: CHEST  2 VIEW  COMPARISON:  02/07/2010 and chest CT dated 01/29/2011.  FINDINGS: The heart remains normal in size. The lungs are mildly progressively hyperexpanded with mildly progressive diffuse peribronchial thickening and accentuation of the interstitial markings. Diffuse osteopenia.  IMPRESSION: Progressive changes of COPD and chronic bronchitis. No acute abnormality.   Electronically Signed   By: Gordan Payment M.D.   On: 11/13/2013 14:47   Ct Head Wo Contrast  11/13/2013   CLINICAL DATA:  Right-sided weakness since 11/08/2013.  EXAM: CT HEAD WITHOUT CONTRAST  TECHNIQUE: Contiguous axial images were obtained from the base of the skull through the vertex without intravenous contrast.  COMPARISON:  05/29/2007  FINDINGS: No mass lesion. No midline shift. No acute hemorrhage or hematoma. No extra-axial fluid collections. No evidence of acute infarction. There is fairly extensive periventricular white matter lucency consistent with chronic small vessel ischemic disease, slightly progressed since the prior study. Osseous structures are normal and unchanged.  IMPRESSION: No acute intracranial abnormality. Extensive chronic small vessel ischemic changes, slightly progressed.   Electronically Signed   By: Geanie Cooley M.D.   On: 11/13/2013 14:23   Mr Maxine Glenn Head Wo Contrast  11/13/2013   CLINICAL DATA:  Right-sided weakness. Abnormal gait. Slurred speech.  EXAM: MRI HEAD WITHOUT CONTRAST  MRA HEAD WITHOUT CONTRAST  TECHNIQUE: Multiplanar, multiecho pulse sequences of the brain and surrounding structures were  obtained without intravenous contrast. Angiographic images of the head were obtained using MRA technique without contrast.  COMPARISON:  11/13/2013 and 05/29/2007 CT.  FINDINGS: MRI HEAD FINDINGS  Acute nonhemorrhagic infarct posterior limb left internal capsule.  Prominent small vessel disease type changes.  Left calvarial 1 cm lucency may represent a hemangioma (unchanged by CT since 2008). Otherwise no evidence of intracranial mass lesion noted on this unenhanced exam.  Cerebellar tonsils minimally low lying but within the range of normal limits.  Pituitary region, pineal region and orbital structures unremarkable.  MRA HEAD FINDINGS  Anterior circulation without medium or large size vessel significant stenosis or occlusion.  Fetal type contribution to the posterior circulation bilaterally.  Ectasia of the anterior communicating artery region without saccular aneurysm noted.  Middle cerebral artery Mild branch vessel irregularity bilaterally.  Right vertebral artery is dominant. No significant stenosis of the vertebral arteries or basilar artery.  Mild irregularity of the posterior inferior cerebellar artery bilaterally.  Non visualization of the anterior inferior cerebellar artery bilaterally.  Bulbous appearance of the basilar tip from which superior cerebral arteries and posterior cerebral arteries arise without discrete saccular aneurysm.  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  Prominent small vessel disease type changes.  Mild intracranial atherosclerotic type changes as detailed above.  These results were called by telephone at the time of interpretation on 11/13/2013 at 7:13 PM to Dr. Vanetta Mulders , who verbally acknowledged these results.   Electronically Signed   By: Bridgett Larsson M.D.   On: 11/13/2013 19:29   Mr Brain Wo Contrast  11/13/2013   CLINICAL DATA:  Right-sided weakness. Abnormal gait. Slurred speech.  EXAM: MRI HEAD WITHOUT CONTRAST  MRA HEAD  WITHOUT CONTRAST  TECHNIQUE: Multiplanar, multiecho pulse sequences of the brain and surrounding structures were obtained without intravenous contrast. Angiographic images of the head were obtained using MRA technique without contrast.  COMPARISON:  11/13/2013 and 05/29/2007 CT.  FINDINGS: MRI HEAD FINDINGS  Acute nonhemorrhagic infarct posterior limb left internal capsule.  Prominent small vessel disease type changes.  Left calvarial 1 cm lucency may represent a hemangioma (unchanged by CT since 2008). Otherwise no evidence of intracranial mass lesion noted on this unenhanced exam.  Cerebellar tonsils minimally low lying but within the range of normal limits.  Pituitary region, pineal region and orbital structures unremarkable.  MRA HEAD FINDINGS  Anterior circulation without medium or large size vessel significant stenosis or occlusion.  Fetal type contribution to the posterior circulation bilaterally.  Ectasia of the anterior communicating artery region without saccular aneurysm noted.  Middle cerebral artery Mild branch vessel irregularity bilaterally.  Right vertebral artery is dominant. No significant stenosis of the vertebral arteries or basilar artery.  Mild irregularity of the posterior inferior cerebellar artery bilaterally.  Non visualization of the anterior inferior cerebellar artery bilaterally.  Bulbous appearance of the basilar tip from which superior cerebral arteries and posterior cerebral arteries arise without discrete saccular aneurysm.  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  Prominent small vessel disease type changes.  Mild intracranial atherosclerotic type changes as detailed above.  These results were called by telephone at the time of interpretation on 11/13/2013 at 7:13 PM to Dr. Vanetta Mulders , who verbally acknowledged these results.   Electronically Signed   By: Bridgett Larsson M.D.   On: 11/13/2013 19:29   Dg Knee Complete 4 Views  Right  11/13/2013   CLINICAL DATA:  Right knee instability.  Previous knee surgery.  EXAM: RIGHT KNEE - COMPLETE 4+ VIEW  COMPARISON:  02/13/2009 and MR dated 04/18/2009.  FINDINGS: Mild lateral joint space narrowing. No fracture or effusion. The bones appear osteopenic.  IMPRESSION: No acute abnormality.   Electronically Signed   By: Gordan Payment M.D.   On: 11/13/2013 14:46    Chart has been reviewed  Assessment/Plan  67 yo F with Hx of HTN and hyperlipidemia off her medications here with right sided hemiplegia CVA  Present on Admission:  . CVA (cerebral infarction) -  - will admit based on  CVA protocol, await results of   Carotid Doppler and Echo,   ECG,  Lipid panel, TSH. Order PT/OT evaluation. Will make sure patient is on antiplatelet agent.  Neurology consult.     Marland Kitchen COPD - stable patient asymptomatic no O2 requirement, lungs clear will write for albuterol PRN . Hypokalemia - will replace check mg Hypertension - off her medications for 6 months. Will avoid sudden drop, hydralazine PRN, will need to restart medications   hypothyrodism - will recheck TSH patietn has been off her medications but states her endocrinologist aproves  Prophylaxis: SCD    CODE STATUS: FULL CODE  Other plan as per orders.  I have spent a total of 55 min on this admission  Jossilyn Benda 11/13/2013, 8:40 PM

## 2013-11-13 NOTE — ED Notes (Signed)
Dr. Adela Glimpse at bedside speaking with pt at this time. 4N notified of delay.

## 2013-11-13 NOTE — ED Notes (Signed)
Dr. Reynolds at bedside.

## 2013-11-13 NOTE — ED Notes (Signed)
Dr. Zackowski at bedside  

## 2013-11-13 NOTE — ED Provider Notes (Addendum)
CSN: [id number]     Arrival date & time 11/13/13  1241 History   First MD Initiated Contact with Patient 11/13/13 1253     Chief Complaint  Patient presents with  . Extremity Weakness  . Spasms   (Consider location/radiation/quality/duration/timing/severity/associated sxs/prior Treatment) Patient is a 67 y.o. female presenting with extremity weakness. The history is provided by the patient and a relative.  Extremity Weakness Pertinent negatives include no chest pain, no abdominal pain and no shortness of breath.   patient with a feeling of a right sided weakness or abnormality since Tuesday CBC unsteady on her feet but no dizziness or lightheadedness or vertigo. The patient the right side seems to be weaker family members also believes that there is some evidence of a speech difficulty and slurred speech. Patient denies any pain.  Past Medical History  Diagnosis Date  . Thyroid disease   . Cancer    Past Surgical History  Procedure Laterality Date  . Breast surgery     History reviewed. No pertinent family history. History  Substance Use Topics  . Smoking status: Current Every Day Smoker  . Smokeless tobacco: Not on file  . Alcohol Use: Yes   OB History   Grav Para Term Preterm Abortions TAB SAB Ect Mult Living                 Review of Systems  Constitutional: Positive for fatigue. Negative for fever.  HENT: Negative for congestion.   Eyes: Negative for redness.  Respiratory: Negative for shortness of breath.   Cardiovascular: Negative for chest pain.  Gastrointestinal: Negative for abdominal pain.  Genitourinary: Negative for dysuria.  Musculoskeletal: Positive for extremity weakness. Negative for back pain.  Skin: Negative for rash.  Neurological: Positive for speech difficulty and weakness.  Hematological: Does not bruise/bleed easily.  Psychiatric/Behavioral: Negative for confusion.    Allergies  Codeine and Levofloxacin  Home Medications  No current  outpatient prescriptions on file. BP 158/90  Pulse 76  Temp(Src) 98.8 F (37.1 C) (Oral)  Resp 19  Ht 5\' 5"  (1.651 m)  Wt 154 lb (69.854 kg)  BMI 25.63 kg/m2  SpO2 94% Physical Exam  Nursing note and vitals reviewed. Constitutional: She is oriented to person, place, and time. She appears well-developed and well-nourished. No distress.  HENT:  Head: Normocephalic and atraumatic.  Mouth/Throat: Oropharynx is clear and moist.  Eyes: Conjunctivae and EOM are normal. Pupils are equal, round, and reactive to light.  Neck: Normal range of motion. Neck supple.  Cardiovascular: Normal rate, regular rhythm, normal heart sounds and intact distal pulses.   No murmur heard. Pulmonary/Chest: Effort normal and breath sounds normal. No respiratory distress. She has no wheezes. She has no rales.  Abdominal: Soft. Bowel sounds are normal. There is no tenderness.  Musculoskeletal: Normal range of motion. She exhibits no edema and no tenderness.  Refer to neuro exam for more details.  Neurological: She is alert and oriented to person, place, and time.  According to family patient has some mild slurred speech I would tend to agree. Patient also with some weakness in the right upper extremity 4/5 with a little bit of drift. No evidence of lower trimming the weakness. The right knee has no swelling patella is midline no joint line tenderness. Good range of motion. No evidence of deformity.  Skin: Skin is warm. No rash noted.    ED Course  Procedures (including critical care time) Labs Review Labs Reviewed  COMPREHENSIVE METABOLIC PANEL - Abnormal;  Notable for the following:    Potassium 3.3 (*)    Glucose, Bld 137 (*)    GFR calc non Af Amer 86 (*)    All other components within normal limits  CBC WITH DIFFERENTIAL - Abnormal; Notable for the following:    Hemoglobin 15.4 (*)    All other components within normal limits  URINALYSIS, ROUTINE W REFLEX MICROSCOPIC   Results for orders placed during  the hospital encounter of 11/13/13  COMPREHENSIVE METABOLIC PANEL      Result Value Range   Sodium 141  135 - 145 mEq/L   Potassium 3.3 (*) 3.5 - 5.1 mEq/L   Chloride 103  96 - 112 mEq/L   CO2 26  19 - 32 mEq/L   Glucose, Bld 137 (*) 70 - 99 mg/dL   BUN 11  6 - 23 mg/dL   Creatinine, Ser 1.61  0.50 - 1.10 mg/dL   Calcium 9.7  8.4 - 09.6 mg/dL   Total Protein 6.9  6.0 - 8.3 g/dL   Albumin 3.7  3.5 - 5.2 g/dL   AST 13  0 - 37 U/L   ALT 13  0 - 35 U/L   Alkaline Phosphatase 79  39 - 117 U/L   Total Bilirubin 0.7  0.3 - 1.2 mg/dL   GFR calc non Af Amer 86 (*) >90 mL/min   GFR calc Af Amer >90  >90 mL/min  CBC WITH DIFFERENTIAL      Result Value Range   WBC 8.0  4.0 - 10.5 K/uL   RBC 4.86  3.87 - 5.11 MIL/uL   Hemoglobin 15.4 (*) 12.0 - 15.0 g/dL   HCT 04.5  40.9 - 81.1 %   MCV 92.4  78.0 - 100.0 fL   MCH 31.7  26.0 - 34.0 pg   MCHC 34.3  30.0 - 36.0 g/dL   RDW 91.4  78.2 - 95.6 %   Platelets 262  150 - 400 K/uL   Neutrophils Relative % 62  43 - 77 %   Neutro Abs 5.0  1.7 - 7.7 K/uL   Lymphocytes Relative 30  12 - 46 %   Lymphs Abs 2.4  0.7 - 4.0 K/uL   Monocytes Relative 6  3 - 12 %   Monocytes Absolute 0.5  0.1 - 1.0 K/uL   Eosinophils Relative 2  0 - 5 %   Eosinophils Absolute 0.1  0.0 - 0.7 K/uL   Basophils Relative 1  0 - 1 %   Basophils Absolute 0.0  0.0 - 0.1 K/uL  URINALYSIS, ROUTINE W REFLEX MICROSCOPIC      Result Value Range   Color, Urine YELLOW  YELLOW   APPearance CLEAR  CLEAR   Specific Gravity, Urine 1.012  1.005 - 1.030   pH 7.0  5.0 - 8.0   Glucose, UA NEGATIVE  NEGATIVE mg/dL   Hgb urine dipstick NEGATIVE  NEGATIVE   Bilirubin Urine NEGATIVE  NEGATIVE   Ketones, ur NEGATIVE  NEGATIVE mg/dL   Protein, ur NEGATIVE  NEGATIVE mg/dL   Urobilinogen, UA 0.2  0.0 - 1.0 mg/dL   Nitrite NEGATIVE  NEGATIVE   Leukocytes, UA NEGATIVE  NEGATIVE    Imaging Review Dg Chest 2 View  11/13/2013   CLINICAL DATA:  Knee instability.  Sinus congestion.  Smoker.  EXAM:  CHEST  2 VIEW  COMPARISON:  02/07/2010 and chest CT dated 01/29/2011.  FINDINGS: The heart remains normal in size. The lungs are mildly progressively hyperexpanded with mildly  progressive diffuse peribronchial thickening and accentuation of the interstitial markings. Diffuse osteopenia.  IMPRESSION: Progressive changes of COPD and chronic bronchitis. No acute abnormality.   Electronically Signed   By: Gordan Payment M.D.   On: 11/13/2013 14:47   Ct Head Wo Contrast  11/13/2013   CLINICAL DATA:  Right-sided weakness since 11/08/2013.  EXAM: CT HEAD WITHOUT CONTRAST  TECHNIQUE: Contiguous axial images were obtained from the base of the skull through the vertex without intravenous contrast.  COMPARISON:  05/29/2007  FINDINGS: No mass lesion. No midline shift. No acute hemorrhage or hematoma. No extra-axial fluid collections. No evidence of acute infarction. There is fairly extensive periventricular white matter lucency consistent with chronic small vessel ischemic disease, slightly progressed since the prior study. Osseous structures are normal and unchanged.  IMPRESSION: No acute intracranial abnormality. Extensive chronic small vessel ischemic changes, slightly progressed.   Electronically Signed   By: Geanie Cooley M.D.   On: 11/13/2013 14:23   Mr Maxine Glenn Head Wo Contrast  11/13/2013   CLINICAL DATA:  Right-sided weakness. Abnormal gait. Slurred speech.  EXAM: MRI HEAD WITHOUT CONTRAST  MRA HEAD WITHOUT CONTRAST  TECHNIQUE: Multiplanar, multiecho pulse sequences of the brain and surrounding structures were obtained without intravenous contrast. Angiographic images of the head were obtained using MRA technique without contrast.  COMPARISON:  11/13/2013 and 05/29/2007 CT.  FINDINGS: MRI HEAD FINDINGS  Acute nonhemorrhagic infarct posterior limb left internal capsule.  Prominent small vessel disease type changes.  Left calvarial 1 cm lucency may represent a hemangioma (unchanged by CT since 2008). Otherwise no evidence  of intracranial mass lesion noted on this unenhanced exam.  Cerebellar tonsils minimally low lying but within the range of normal limits.  Pituitary region, pineal region and orbital structures unremarkable.  MRA HEAD FINDINGS  Anterior circulation without medium or large size vessel significant stenosis or occlusion.  Fetal type contribution to the posterior circulation bilaterally.  Ectasia of the anterior communicating artery region without saccular aneurysm noted.  Middle cerebral artery Mild branch vessel irregularity bilaterally.  Right vertebral artery is dominant. No significant stenosis of the vertebral arteries or basilar artery.  Mild irregularity of the posterior inferior cerebellar artery bilaterally.  Non visualization of the anterior inferior cerebellar artery bilaterally.  Bulbous appearance of the basilar tip from which superior cerebral arteries and posterior cerebral arteries arise without discrete saccular aneurysm.  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  Prominent small vessel disease type changes.  Mild intracranial atherosclerotic type changes as detailed above.  These results were called by telephone at the time of interpretation on 11/13/2013 at 7:13 PM to Dr. Vanetta Mulders , who verbally acknowledged these results.   Electronically Signed   By: Bridgett Larsson M.D.   On: 11/13/2013 19:29   Mr Brain Wo Contrast  11/13/2013   CLINICAL DATA:  Right-sided weakness. Abnormal gait. Slurred speech.  EXAM: MRI HEAD WITHOUT CONTRAST  MRA HEAD WITHOUT CONTRAST  TECHNIQUE: Multiplanar, multiecho pulse sequences of the brain and surrounding structures were obtained without intravenous contrast. Angiographic images of the head were obtained using MRA technique without contrast.  COMPARISON:  11/13/2013 and 05/29/2007 CT.  FINDINGS: MRI HEAD FINDINGS  Acute nonhemorrhagic infarct posterior limb left internal capsule.  Prominent small vessel disease type changes.   Left calvarial 1 cm lucency may represent a hemangioma (unchanged by CT since 2008). Otherwise no evidence of intracranial mass lesion noted on this unenhanced exam.  Cerebellar tonsils minimally low lying but within the  range of normal limits.  Pituitary region, pineal region and orbital structures unremarkable.  MRA HEAD FINDINGS  Anterior circulation without medium or large size vessel significant stenosis or occlusion.  Fetal type contribution to the posterior circulation bilaterally.  Ectasia of the anterior communicating artery region without saccular aneurysm noted.  Middle cerebral artery Mild branch vessel irregularity bilaterally.  Right vertebral artery is dominant. No significant stenosis of the vertebral arteries or basilar artery.  Mild irregularity of the posterior inferior cerebellar artery bilaterally.  Non visualization of the anterior inferior cerebellar artery bilaterally.  Bulbous appearance of the basilar tip from which superior cerebral arteries and posterior cerebral arteries arise without discrete saccular aneurysm.  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  Prominent small vessel disease type changes.  Mild intracranial atherosclerotic type changes as detailed above.  These results were called by telephone at the time of interpretation on 11/13/2013 at 7:13 PM to Dr. Vanetta Mulders , who verbally acknowledged these results.   Electronically Signed   By: Bridgett Larsson M.D.   On: 11/13/2013 19:29   Dg Knee Complete 4 Views Right  11/13/2013   CLINICAL DATA:  Right knee instability.  Previous knee surgery.  EXAM: RIGHT KNEE - COMPLETE 4+ VIEW  COMPARISON:  02/13/2009 and MR dated 04/18/2009.  FINDINGS: Mild lateral joint space narrowing. No fracture or effusion. The bones appear osteopenic.  IMPRESSION: No acute abnormality.   Electronically Signed   By: Gordan Payment M.D.   On: 11/13/2013 14:46    EKG Interpretation    Date/Time:  Saturday November 13 2013 13:04:36 EST Ventricular Rate:  79 PR Interval:  201 QRS Duration: 82 QT Interval:  372 QTC Calculation: 426 R Axis:   -91 Text Interpretation:  Sinus rhythm LAD, consider left anterior fascicular block RSR' in V1 or V2, right VCD or RVH No significant change since last tracing Confirmed by Aneshia Jacquet  MD, Ruby Dilone ([id number]) on 11/13/2013 1:27:21 PM            MDM   1. CVA (cerebral infarction)      Patient's history and some subtle neuro findings suggestive of stroke. However head CT was negative. Some question that perhaps maybe the right knee was unstable x-rays of the bony part of the knee appear normal physical examination the knee shows no obvious problems. MRI of the brain ordered to further evaluate. Clinically very suspicious the patient had a stroke on Tuesday. Patient's primary care doctor is Dr[facility].  MRI confirms CVA acute subacute consistent with the onset of symptoms on Tuesday. It shows an acute nonhemorrhagic infarct in the posterior limb left internal capsule. Will notify [facility] hospital less than make arrangements for admission with [facility] hospitalist team.    Shelda Jakes, MD 11/13/13 [id number]  Shelda Jakes, MD 11/13/13 [id number]

## 2013-11-14 LAB — LIPID PANEL
Cholesterol: 202 mg/dL — ABNORMAL HIGH (ref 0–200)
LDL Cholesterol: 135 mg/dL — ABNORMAL HIGH (ref 0–99)
Total CHOL/HDL Ratio: 4.8 RATIO
Triglycerides: 126 mg/dL (ref <150)
VLDL: 25 mg/dL (ref 0–40)

## 2013-11-14 LAB — BASIC METABOLIC PANEL
BUN: 10 mg/dL (ref 6–23)
CO2: 23 mEq/L (ref 19–32)
Calcium: 9.2 mg/dL (ref 8.4–10.5)
Chloride: 107 mEq/L (ref 96–112)
GFR calc Af Amer: >90 mL/min (ref >90)
GFR calc non Af Amer: 90 mL/min — ABNORMAL LOW (ref >90)
Sodium: 139 mEq/L (ref 135–145)

## 2013-11-14 LAB — TSH: TSH: 18.155 u[IU]/mL — ABNORMAL HIGH (ref 0.350–4.500)

## 2013-11-14 LAB — MAGNESIUM: Magnesium: 1.9 mg/dL (ref 1.5–2.5)

## 2013-11-14 MED ORDER — SODIUM CHLORIDE 0.9 % IV SOLN
INTRAVENOUS | Status: DC
  Administered 2013-11-14: 11:00:00 via INTRAVENOUS

## 2013-11-14 MED ORDER — LEVOTHYROXINE SODIUM 25 MCG PO TABS
25.0000 ug | ORAL_TABLET | Freq: Every day | ORAL | Status: DC
  Administered 2013-11-14 – 2013-11-15 (×2): 25 ug via ORAL
  Filled 2013-11-14 (×3): qty 1

## 2013-11-14 MED ORDER — ALBUTEROL SULFATE (5 MG/ML) 0.5% IN NEBU: 2.5000 mg | INHALATION_SOLUTION | RESPIRATORY_TRACT | Status: DC | PRN

## 2013-11-14 MED ORDER — SIMVASTATIN 20 MG PO TABS
20.0000 mg | ORAL_TABLET | Freq: Every day | ORAL | Status: DC
  Filled 2013-11-14: qty 1

## 2013-11-14 MED ORDER — NICOTINE 21 MG/24HR TD PT24
21.0000 mg | MEDICATED_PATCH | Freq: Every day | TRANSDERMAL | Status: DC
  Administered 2013-11-14 – 2013-11-15 (×2): 21 mg via TRANSDERMAL
  Filled 2013-11-14 (×2): qty 1

## 2013-11-14 MED ORDER — SIMVASTATIN 10 MG PO TABS
10.0000 mg | ORAL_TABLET | Freq: Every day | ORAL | Status: DC
  Administered 2013-11-14: 10 mg via ORAL
  Filled 2013-11-14 (×2): qty 1

## 2013-11-14 MED ORDER — POTASSIUM CHLORIDE 10 MEQ/100ML IV SOLN
10.0000 meq | INTRAVENOUS | Status: AC
  Administered 2013-11-14 (×4): 10 meq via INTRAVENOUS
  Filled 2013-11-14 (×4): qty 100

## 2013-11-14 MED ORDER — ASPIRIN EC 81 MG PO TBEC
81.0000 mg | DELAYED_RELEASE_TABLET | ORAL | Status: DC
  Filled 2013-11-14 (×2): qty 1

## 2013-11-14 NOTE — Evaluation (Signed)
Speech Language Pathology Evaluation Patient Details Name: Michaela Guerra MRN: [id number] DOB: June 26, 1946 Today's Date: 11/14/2013 Time: 1245-1310 SLP Time Calculation (min): 25 min  Problem List:  Patient Active Problem List   Diagnosis Date Noted  . CVA (cerebral infarction) 11/13/2013  . Hypokalemia 11/13/2013  . CVA (cerebral vascular accident) 11/13/2013  . ADENOCARCINOMA, LEFT BREAST 05/03/2010  . SMOKER 05/03/2010  . DECREASED HEARING, LEFT EAR 05/03/2010  . ARTHRITIS 05/03/2010  . URINARY INCONTINENCE 05/03/2010  . COLONIC POLYPS, HX OF 05/03/2010  . ALLERGIC RHINITIS 09/07/2009  . COLONIC POLYPS, HYPERPLASTIC 07/12/2009  . DERANGEMENT OF ANTERIOR HORN OF LATERAL MENISCUS 04/24/2009  . IBS 10/28/2008  . HYPOTHYROIDISM 07/07/2008  . ACTINIC KERATOSIS 07/07/2008  . FATIGUE 07/07/2008  . ANXIETY 03/15/2008  . ALCOHOL ABUSE 03/15/2008  . DEPRESSION 03/15/2008  . OSTEOPENIA 02/11/2008  . HYPERTENSION 02/02/2008  . CARCINOMA, BREAST, HX OF 11/03/2007  . ANEMIA, IRON DEFICIENCY NOS 10/08/2007  . HYPERLIPIDEMIA 08/21/2007  . COPD 08/21/2007  . GERD 08/21/2007   Past Medical History:  Past Medical History  Diagnosis Date  . Thyroid disease   . Cancer   . Hypertension   . COPD (chronic obstructive pulmonary disease)   . Stroke   . GERD (gastroesophageal reflux disease)    Past Surgical History:  Past Surgical History  Procedure Laterality Date  . Breast surgery     HPI:  Michaela Guerra is a 67 y.o. female  has a past medical history of Thyroid disease; Cancer; Hypertension; COPD (chronic obstructive pulmonary disease); Stroke; and GERD (gastroesophageal reflux disease).  Patient woke up 5 days ago with ataxia and sl;ight weakness of right arm and foot she denies any other problems associated with that at the time. She thought it would get better but it did not. She presented to ER this AM and had an MRI doe that showed new nonhemorrhagic infarct posterior limb left  internal capsule. Neuro hospitalist was made aware. Hospitalist called for admission. Patient states she stopped taking all of her medications 6 months ago she ran out of her medications. She felt better off her synthroid and endocrinology told her to stay off. She has also stopped her statins and antihypertensives as well.     Assessment / Plan / Recommendation Clinical Impression  Cognitive Linguistic Evaluation completed per Stroke Protocol.  Minimal deficits in areas of divided and alternating attention with complex verbal and complex functional tasks, higher level problem solving,  and deficits in area of anticipatory awareness of current physical deficits.  Deficits present but no Speech Language Pathology Services warranted in acute care setting as cognitive skills functional in current setting. Patient does not need any further Skilled ST at next level of care as will receive necessary assist  for complex ADL's from family members.  ST to sign off as education complete.      SLP Assessment  Patient does not need any further Speech Lanaguage Pathology Services    Follow Up Recommendations  24 hour supervision/assistance        SLP Evaluation Prior Functioning  Cognitive/Linguistic Baseline: Information not available Type of Home: Apartment  Lives With: Alone Available Help at Discharge: Family;Available PRN/intermittently Education: [employer] Vocation: Retired   IT consultant  Overall Cognitive Status: No family/caregiver present to determine baseline cognitive functioning Arousal/Alertness: Awake/alert Orientation Level: Oriented X4 Attention: Alternating;Divided Alternating Attention: Impaired Alternating Attention Impairment: Verbal complex;Functional complex Divided Attention: Impaired Divided Attention Impairment: Verbal complex;Functional complex Memory: Appears intact Awareness: Impaired Awareness Impairment: Anticipatory impairment  Problem Solving: Appears  intact Safety/Judgment: Impaired    Comprehension  Auditory Comprehension Overall Auditory Comprehension: Appears within functional limits for tasks assessed    Expression Expression Primary Mode of Expression: Verbal Verbal Expression Overall Verbal Expression: Appears within functional limits for tasks assessed Written Expression Dominant Hand: Left Written Expression: Not tested   Oral / Motor Oral Motor/Sensory Function Overall Oral Motor/Sensory Function: Appears within functional limits for tasks assessed Motor Speech Overall Motor Speech: Appears within functional limits for tasks assessed   GO    Moreen Fowler MS, CCC-SLP [phone] [facility] 11/14/2013, 1:20 PM

## 2013-11-14 NOTE — Progress Notes (Signed)
[facility] PROGRESS NOTE  Michaela Guerra WUX:[id number] DOB: 03-08-1946 DOA: 11/13/2013 PCP: Rene Paci, MD  Assessment/Plan: 1. 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 Neurology has started the patient on aspirin 81 mg 3 times a week Monday resident Friday for secondary stroke prevention. 2-D echo and carotid Dopplers are pending at this time. 2. Hyperlipidemia- patient started on low-dose statin 3. Hypokalemia- potassium replaced. 4. Nonsustained V. Tach- patient had 3 beat run of NSVT, likely due to low potassium which has been replaced. Her magnesium level is 1.9. We'll continue to monitor on telemetry. Patient was on synthroid and stopped taking it . Her TSH is 18.1 5. Hypothyroidism- TSH is 18.1, she has not taken synthroid for three months. Will start her on Synthroid 25 mcg po daily.    Code Status: Full code Family Communication: *Discussed with daughter at bedside Disposition Plan: CIR   Consultants:  Neurology  Procedures:  None  Antibiotics:  None  HPI/Subjective: Patient seen and examined, admitted with gait instability and found to have cute nonhemorrhagic infarct posterior limb left internal capsule.   Objective: Filed Vitals:   11/14/13 1720  BP: 148/85  Pulse: 70  Temp: 97.9 F (36.6 C)  Resp: 18    Intake/Output Summary (Last 24 hours) at 11/14/13 1755 Last data filed at 11/14/13 1610  Gross per 24 hour  Intake  302.5 ml  Output      1 ml  Net  301.5 ml   Filed Weights   11/13/13 1246 11/13/13 2100  Weight: 69.854 kg (154 lb) 66.4 kg (146 lb 6.2 oz)    Exam:  Physical Exam: Head: Normocephalic, atraumatic.  Eyes: No signs of jaundice, EOMI Nose: Mucous membranes dry.  Throat: Oropharynx nonerythematous, no exudate appreciated.  Neck: supple,No deformities,  masses, or tenderness noted. Lungs: Normal respiratory effort. B/L Clear to auscultation, no crackles or wheezes.  Heart: Regular RR. S1 and S2 normal  Abdomen: BS normoactive. Soft, Nondistended, non-tender.  Extremities: No pretibial edema, no erythema  Data Reviewed: Basic Metabolic Panel:  Recent Labs Lab 11/13/13 1334 11/14/13 0235 11/14/13 1515  NA 141  --  139  K 3.3*  --  4.0  CL 103  --  107  CO2 26  --  23  GLUCOSE 137*  --  116*  BUN 11  --  10  CREATININE 0.72  --  0.64  CALCIUM 9.7  --  9.2  MG  --  1.9  --    Liver Function Tests:  Recent Labs Lab 11/13/13 1334  AST 13  ALT 13  ALKPHOS 79  BILITOT 0.7  PROT 6.9  ALBUMIN 3.7   No results found for this basename: LIPASE, AMYLASE,  in the last 168 hours No results found for this basename: AMMONIA,  in the last 168 hours CBC:  Recent Labs Lab 11/13/13 1334  WBC 8.0  NEUTROABS 5.0  HGB 15.4*  HCT 44.9  MCV 92.4  PLT 262   Cardiac Enzymes: No results found for this basename: CKTOTAL, CKMB, CKMBINDEX, TROPONINI,  in the last 168 hours BNP (last 3 results) No results found for this basename: PROBNP,  in the last 8760 hours CBG: No results found for this basename: GLUCAP,  in the last 168 hours  No results found for this or any previous visit (from the past  240 hour(s)).   Studies: Dg Chest 2 View  11/13/2013   CLINICAL DATA:  Knee instability.  Sinus congestion.  Smoker.  EXAM: CHEST  2 VIEW  COMPARISON:  02/07/2010 and chest CT dated 01/29/2011.  FINDINGS: The heart remains normal in size. The lungs are mildly progressively hyperexpanded with mildly progressive diffuse peribronchial thickening and accentuation of the interstitial markings. Diffuse osteopenia.  IMPRESSION: Progressive changes of COPD and chronic bronchitis. No acute abnormality.   Electronically Signed   By: Gordan Payment M.D.   On: 11/13/2013 14:47   Ct Head Wo Contrast  11/13/2013   CLINICAL DATA:  Right-sided weakness since  11/08/2013.  EXAM: CT HEAD WITHOUT CONTRAST  TECHNIQUE: Contiguous axial images were obtained from the base of the skull through the vertex without intravenous contrast.  COMPARISON:  05/29/2007  FINDINGS: No mass lesion. No midline shift. No acute hemorrhage or hematoma. No extra-axial fluid collections. No evidence of acute infarction. There is fairly extensive periventricular white matter lucency consistent with chronic small vessel ischemic disease, slightly progressed since the prior study. Osseous structures are normal and unchanged.  IMPRESSION: No acute intracranial abnormality. Extensive chronic small vessel ischemic changes, slightly progressed.   Electronically Signed   By: Geanie Cooley M.D.   On: 11/13/2013 14:23   Mr Maxine Glenn Head Wo Contrast  11/13/2013   CLINICAL DATA:  Right-sided weakness. Abnormal gait. Slurred speech.  EXAM: MRI HEAD WITHOUT CONTRAST  MRA HEAD WITHOUT CONTRAST  TECHNIQUE: Multiplanar, multiecho pulse sequences of the brain and surrounding structures were obtained without intravenous contrast. Angiographic images of the head were obtained using MRA technique without contrast.  COMPARISON:  11/13/2013 and 05/29/2007 CT.  FINDINGS: MRI HEAD FINDINGS  Acute nonhemorrhagic infarct posterior limb left internal capsule.  Prominent small vessel disease type changes.  Left calvarial 1 cm lucency may represent a hemangioma (unchanged by CT since 2008). Otherwise no evidence of intracranial mass lesion noted on this unenhanced exam.  Cerebellar tonsils minimally low lying but within the range of normal limits.  Pituitary region, pineal region and orbital structures unremarkable.  MRA HEAD FINDINGS  Anterior circulation without medium or large size vessel significant stenosis or occlusion.  Fetal type contribution to the posterior circulation bilaterally.  Ectasia of the anterior communicating artery region without saccular aneurysm noted.  Middle cerebral artery Mild branch vessel irregularity  bilaterally.  Right vertebral artery is dominant. No significant stenosis of the vertebral arteries or basilar artery.  Mild irregularity of the posterior inferior cerebellar artery bilaterally.  Non visualization of the anterior inferior cerebellar artery bilaterally.  Bulbous appearance of the basilar tip from which superior cerebral arteries and posterior cerebral arteries arise without discrete saccular aneurysm.  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  Prominent small vessel disease type changes.  Mild intracranial atherosclerotic type changes as detailed above.  These results were called by telephone at the time of interpretation on 11/13/2013 at 7:13 PM to Dr. Vanetta Mulders , who verbally acknowledged these results.   Electronically Signed   By: Bridgett Larsson M.D.   On: 11/13/2013 19:29   Mr Brain Wo Contrast  11/13/2013   CLINICAL DATA:  Right-sided weakness. Abnormal gait. Slurred speech.  EXAM: MRI HEAD WITHOUT CONTRAST  MRA HEAD WITHOUT CONTRAST  TECHNIQUE: Multiplanar, multiecho pulse sequences of the brain and surrounding structures were obtained without intravenous contrast. Angiographic images of the head were obtained using MRA technique without contrast.  COMPARISON:  11/13/2013 and 05/29/2007 CT.  FINDINGS: MRI  HEAD FINDINGS  Acute nonhemorrhagic infarct posterior limb left internal capsule.  Prominent small vessel disease type changes.  Left calvarial 1 cm lucency may represent a hemangioma (unchanged by CT since 2008). Otherwise no evidence of intracranial mass lesion noted on this unenhanced exam.  Cerebellar tonsils minimally low lying but within the range of normal limits.  Pituitary region, pineal region and orbital structures unremarkable.  MRA HEAD FINDINGS  Anterior circulation without medium or large size vessel significant stenosis or occlusion.  Fetal type contribution to the posterior circulation bilaterally.  Ectasia of the anterior  communicating artery region without saccular aneurysm noted.  Middle cerebral artery Mild branch vessel irregularity bilaterally.  Right vertebral artery is dominant. No significant stenosis of the vertebral arteries or basilar artery.  Mild irregularity of the posterior inferior cerebellar artery bilaterally.  Non visualization of the anterior inferior cerebellar artery bilaterally.  Bulbous appearance of the basilar tip from which superior cerebral arteries and posterior cerebral arteries arise without discrete saccular aneurysm.  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  Prominent small vessel disease type changes.  Mild intracranial atherosclerotic type changes as detailed above.  These results were called by telephone at the time of interpretation on 11/13/2013 at 7:13 PM to Dr. Vanetta Mulders , who verbally acknowledged these results.   Electronically Signed   By: Bridgett Larsson M.D.   On: 11/13/2013 19:29   Dg Knee Complete 4 Views Right  11/13/2013   CLINICAL DATA:  Right knee instability.  Previous knee surgery.  EXAM: RIGHT KNEE - COMPLETE 4+ VIEW  COMPARISON:  02/13/2009 and MR dated 04/18/2009.  FINDINGS: Mild lateral joint space narrowing. No fracture or effusion. The bones appear osteopenic.  IMPRESSION: No acute abnormality.   Electronically Signed   By: Gordan Payment M.D.   On: 11/13/2013 14:46    Scheduled Meds: . [START ON 11/15/2013] aspirin EC  81 mg Oral 3 times weekly  . nicotine  21 mg Transdermal Daily  . simvastatin  10 mg Oral q1800   Continuous Infusions: . sodium chloride 75 mL/hr at 11/13/13 2200    Active Problems:   HYPERTENSION   COPD   CVA (cerebral infarction)   Hypokalemia   CVA (cerebral vascular accident)    Time spent: 25 min    [facility]  [facility] Pager [phone]*. If 7PM-7AM, please contact night-coverage at [email or web address], password [facility] 11/14/2013, 5:55 PM  LOS: 1 day

## 2013-11-14 NOTE — Evaluation (Signed)
Physical Therapy Evaluation Patient Details Name: Michaela Guerra MRN: [id number] DOB: [birth year] Today's Date: 11/14/2013 Time: 1131-1205 PT Time Calculation (min): 34 min  PT Assessment / Plan / Recommendation History of Present Illness  Michaela Guerra is an 67 y.o. female who reports that she awakened on Tuesday and noticed that she was having difficulty with gait.  She also noticed some difficulty with her right side specifically.  Her arm has gotten a little better but with the majority of her symptoms still present today she presented for medical evaluation.  She has not experienced paresthesias.  Acute nonhemorrhagic infarct posterior limb left internal capsule  Clinical Impression  Pt admitted with above. Pt currently with functional limitations due to the deficits listed below (see PT Problem List).  Pt will benefit from skilled PT to increase their independence and safety with mobility, to prevent secondary musculoskeletal complications, and  to allow discharge to the venue listed below.       PT Assessment  Patient needs continued PT services    Follow Up Recommendations  CIR    Does the patient have the potential to tolerate intense rehabilitation      Barriers to Discharge Decreased caregiver support Will likely be able to reach modified independent functional level with CIR stay for intensive rehab    Equipment Recommendations  Rolling walker with 5" wheels;3in1 (PT)    Recommendations for Other Services OT consult;Rehab consult   Frequency Min 4X/week    Precautions / Restrictions Precautions Precautions: Fall   Pertinent Vitals/Pain no apparent distress Occasional knee pain R, especially with hyperextension in stance, patient repositioned for comfort       Mobility  Bed Mobility Bed Mobility: Not assessed Details for Bed Mobility Assistance: Pt EOB upon my arrival Transfers Transfers: Sit to Stand;Stand to Sit;Squat Pivot Transfers Sit to Stand: 4: Min  assist;From chair/3-in-1;With armrests Stand to Sit: 4: Min assist;With armrests Squat Pivot Transfers: 4: Min assist Details for Transfer Assistance: Cues for technique; Min assist for steadiness and very clos guard for R knee buckle Ambulation/Gait Ambulation/Gait Assistance: 3: Mod assist Ambulation Distance (Feet): 100 Feet Assistive device: Rolling walker Ambulation/Gait Assistance Details: Overall decr coordination, with erratic step width; Required close guard for R knee stability in stance; Noted tendency to hyperxtend into genu recurvatum for stance stability; cues for safety and RW proximity Gait Pattern: Decreased stride length Modified Rankin (Stroke Patients Only) Pre-Morbid Rankin Score: No symptoms Modified Rankin: Moderate disability    Exercises     PT Diagnosis: Difficulty walking;Abnormality of gait  PT Problem List: Decreased strength;Decreased activity tolerance;Decreased balance;Decreased mobility;Decreased coordination;Decreased knowledge of use of DME;Decreased safety awareness PT Treatment Interventions: DME instruction;Gait training;Stair training;Functional mobility training;Therapeutic activities;Therapeutic exercise;Balance training;Neuromuscular re-education;Patient/family education     PT Goals(Current goals can be found in the care plan section) Acute Rehab PT Goals Patient Stated Goal: Get back to Independence PT Goal Formulation: With patient Time For Goal Achievement: 11/21/13 Potential to Achieve Goals: Good  Visit Information  Last PT Received On: 11/14/13 Assistance Needed: +1 History of Present Illness: Michaela Guerra is an 67 y.o. female who reports that she awakened on Tuesday and noticed that she was having difficulty with gait.  She also noticed some difficulty with her right side specifically.  Her arm has gotten a little better but with the majority of her symptoms still present today she presented for medical evaluation.  She has not  experienced paresthesias.  Acute nonhemorrhagic infarct posterior limb left internal capsule  Prior Functioning  Home Living Family/patient expects to be discharged to:: Other (Comment) (Independent living apartment) Available Help at Discharge: Family;Available PRN/intermittently Type of Home: Apartment  Lives With: Alone Prior Function Level of Independence: Independent Communication Communication: No difficulties Dominant Hand: Left    Cognition  Cognition Arousal/Alertness: Awake/alert Behavior During Therapy: WFL for tasks assessed/performed Overall Cognitive Status: Within Functional Limits for tasks assessed    Extremity/Trunk Assessment Upper Extremity Assessment Upper Extremity Assessment: Defer to OT evaluation Lower Extremity Assessment Lower Extremity Assessment: RLE deficits/detail RLE Deficits / Details: Evidence of RLE weakness, with decr stance stability, and tendency to "pop' into genu recurvatum RLE Coordination: decreased gross motor   Balance Standardized Balance Assessment Standardized Balance Assessment:  (Recommend Berg or DGI next visit)  End of Session PT - End of Session Activity Tolerance: Patient tolerated treatment well Patient left: in chair;with call bell/phone within reach Nurse Communication: Mobility status  GP     Van Clines [facility] [place], [place] [phone]  11/14/2013, 1:45 PM

## 2013-11-14 NOTE — Progress Notes (Signed)
Stroke Team Progress Note  HISTORY Michaela Guerra is a 67 y.o. female who reported that she awakened on Tuesday, 11/09/2013,  and noticed that she was having difficulty with her gait. She also noticed some difficulty with her right side specifically. Her arm had gotten a little better but with the majority of her symptoms still present 11/13/2013 she presented for medical evaluation. She had not experienced paresthesias.    Date last known well: Date: 11/08/2013  Time last known well: Time: 23:00  tPA Given: No: Outside time window   SUBJECTIVE Patient states she has a history of hemophilia and has had significant bruising with aspirin in the past.   OBJECTIVE Most recent Vital Signs: Filed Vitals:   11/13/13 2100 11/13/13 2300 11/14/13 0100 11/14/13 0443  BP: 169/90 177/84 159/79 168/90  Pulse: 89 89 97 79  Temp: 97.9 F (36.6 C) 97.8 F (36.6 C) 97.6 F (36.4 C) 97.6 F (36.4 C)  TempSrc: Oral Oral Oral Oral  Resp: 18 19 19 20   Height:      Weight: 66.4 kg (146 lb 6.2 oz)     SpO2: 98% 98% 96% 98%   CBG (last 3)  No results found for this basename: GLUCAP,  in the last 72 hours  IV Fluid Intake:   . sodium chloride    . sodium chloride 75 mL/hr at 11/13/13 2200    MEDICATIONS  . aspirin  300 mg Rectal Daily   Or  . aspirin  325 mg Oral Daily  . nicotine  21 mg Transdermal Once   PRN:  acetaminophen, acetaminophen, albuterol, hydrALAZINE, senna-docusate  Diet:  Cardiac thin liquids Activity:   Up with assistance DVT Prophylaxis:  SCDs  CLINICALLY SIGNIFICANT STUDIES Basic Metabolic Panel:   Recent Labs Lab 11/13/13 1334 11/14/13 0235  NA 141  --   K 3.3*  --   CL 103  --   CO2 26  --   GLUCOSE 137*  --   BUN 11  --   CREATININE 0.72  --   CALCIUM 9.7  --   MG  --  1.9   Liver Function Tests:   Recent Labs Lab 11/13/13 1334  AST 13  ALT 13  ALKPHOS 79  BILITOT 0.7  PROT 6.9  ALBUMIN 3.7   CBC:   Recent Labs Lab 11/13/13 1334  WBC 8.0   NEUTROABS 5.0  HGB 15.4*  HCT 44.9  MCV 92.4  PLT 262   Coagulation: No results found for this basename: LABPROT, INR,  in the last 168 hours Cardiac Enzymes: No results found for this basename: CKTOTAL, CKMB, CKMBINDEX, TROPONINI,  in the last 168 hours Urinalysis:   Recent Labs Lab 11/13/13 1450  COLORURINE YELLOW  LABSPEC 1.012  PHURINE 7.0  GLUCOSEU NEGATIVE  HGBUR NEGATIVE  BILIRUBINUR NEGATIVE  KETONESUR NEGATIVE  PROTEINUR NEGATIVE  UROBILINOGEN 0.2  NITRITE NEGATIVE  LEUKOCYTESUR NEGATIVE   Lipid Panel    Component Value Date/Time   CHOL 202* 11/14/2013 0235   TRIG 126 11/14/2013 0235   HDL 42 11/14/2013 0235   CHOLHDL 4.8 11/14/2013 0235   VLDL 25 11/14/2013 0235   LDLCALC 135* 11/14/2013 0235   HgbA1C  No results found for this basename: HGBA1C    Urine Drug Screen:   No results found for this basename: labopia,  cocainscrnur,  labbenz,  amphetmu,  thcu,  labbarb    Alcohol Level: No results found for this basename: ETH,  in the last 168 hours  Dg  Chest 2 View 11/13/2013    Progressive changes of COPD and chronic bronchitis. No acute abnormality.     Ct Head Wo Contrast 11/13/2013    No acute intracranial abnormality. Extensive chronic small vessel ischemic changes, slightly progressed.     Mri / Mra Head Wo Contrast 11/13/2013    Acute nonhemorrhagic infarct posterior limb left internal capsule.  Prominent small vessel disease type changes.  Mild intracranial atherosclerotic type changes as detailed above.     Dg Knee Complete 4 Views Right 11/13/2013    No acute abnormality.     2D Echocardiogram  pending  Carotid Doppler  pending  EKG  sinus rhythm rate 79 beats per minute  Therapy Recommendations pending  Physical Exam    Neurologic Examination:   Mental Status:  Alert, oriented, thought content appropriate. Speech fluent without evidence of aphasia. Able to follow 3 step commands without difficulty.  Cranial Nerves:  II: Discs flat  bilaterally; Visual fields grossly normal, pupils equal, round, reactive to light and accommodation  III,IV, VI: ptosis not present, extra-ocular motions intact bilaterally  V,VII: decrease in right NLF, facial light touch sensation normal bilaterally  VIII: hearing normal bilaterally  IX,X: gag reflex present  XI: bilateral shoulder shrug  XII: midline tongue extension  Motor:  Right : Upper extremity 5/5 Left: Upper extremity 4+/5 with pronator drift  Lower extremity 5/5 Lower extremity 5-/5  Tone and bulk:normal tone throughout; no atrophy noted  Sensory: Pinprick and light touch intact throughout, bilaterally  Deep Tendon Reflexes: 2+ and symmetric with 1+ AJ's bilaterally  Plantars:  Right: upgoing Left: upgoing  Cerebellar:  normal finger-to-nose, dysmetria with hell to shin on the right  Gait: Unable to test  CV: pulses palpable throughout    ASSESSMENT Michaela Guerra is a 67 y.o. female presenting with left upper extremity paresis and gait instability. The patient was outside the time window for TPA. An MRI revealed an acute nonhemorrhagic infarct posterior limb left internal capsule. Infarct felt to be thrombotic secondary to small vessel disease. On no antithrombotics prior to admission. Now on aspirin 325 mg orally every day for secondary stroke prevention. Patient with resultant mild left upper extremity paresis. Work up underway.   Tobacco history  Hypertension history  Previous stroke  Hyperlipidemia cholesterol 202 LDL 135 - will add statin medication (low dose per patient request)  Hypokalemia - supplemented  History of hemophilia per patient with previous intolerance to aspirin secondary to bruising. Per Dr. Nona Dell aspirin 81 mg Monday Wednesdays and Fridays.  Hospital day # 1  TREATMENT/PLAN  Change aspirin to 81 mg Monday Wednesday and Fridays as noted above for secondary stroke prevention.  Await therapy evaluations  Await 2-D echo and carotid  Dopplers  Statin added (low dose per patient request)  Delton See PA-C [facility] Pager [phone] 11/14/2013, 10:39 AM  I have personally obtained a history, examined the patient, evaluated imaging results, and formulated the assessment and plan of care. I agree with the above.  Left posterior limb internal capsule ischemic stroke. Pt off ASA for past 1 yr as she states she has hemophelia. Pt was on ASA 81 daily which caused very easy bruising. Will start ASA 81 3x/week as well as low dose Lipitor.  Rest stroke work up as above.   Pauletta Browns

## 2013-11-15 ENCOUNTER — Encounter (HOSPITAL_COMMUNITY): Payer: Self-pay | Admitting: Physician Assistant

## 2013-11-15 DIAGNOSIS — I6789 Other cerebrovascular disease: Secondary | ICD-10-CM

## 2013-11-15 DIAGNOSIS — I6330 Cerebral infarction due to thrombosis of unspecified cerebral artery: Secondary | ICD-10-CM

## 2013-11-15 DIAGNOSIS — G8250 Quadriplegia, unspecified: Secondary | ICD-10-CM

## 2013-11-15 DIAGNOSIS — I4949 Other premature depolarization: Secondary | ICD-10-CM

## 2013-11-15 DIAGNOSIS — J449 Chronic obstructive pulmonary disease, unspecified: Secondary | ICD-10-CM

## 2013-11-15 DIAGNOSIS — J4489 Other specified chronic obstructive pulmonary disease: Secondary | ICD-10-CM

## 2013-11-15 MED ORDER — LEVOTHYROXINE SODIUM 25 MCG PO TABS: 25.0000 ug | ORAL_TABLET | Freq: Every day | ORAL | Status: DC

## 2013-11-15 MED ORDER — ASPIRIN 81 MG PO TBEC: 81.0000 mg | DELAYED_RELEASE_TABLET | ORAL | Status: DC

## 2013-11-15 MED ORDER — SIMVASTATIN 10 MG PO TABS: 10.0000 mg | ORAL_TABLET | Freq: Every day | ORAL | Status: DC

## 2013-11-15 NOTE — Progress Notes (Signed)
Rehab Admissions Coordinator Note:  Patient was screened by Trish Mage for appropriateness for an Inpatient Acute Rehab Consult.  At this time, we are recommending Inpatient Rehab consult.  Trish Mage 11/15/2013, 8:40 AM  I can be reached at [phone].

## 2013-11-15 NOTE — Consult Note (Signed)
Physical Medicine and Rehabilitation Consult Reason for Consult:CVA Referring Physician: triad   HPI: Michaela Guerra is a 67 y.o. female with history of hypertension, COPD/tobacco abuse.Patient was independent prior to admission living in a senior [facility] complex. 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   Review of Systems  Respiratory: Positive for cough.   Gastrointestinal:       GERD  Neurological: Positive for weakness.  All other systems reviewed and are negative.   Past Medical History  Diagnosis Date  . Thyroid disease   . Cancer   . Hypertension   . COPD (chronic obstructive pulmonary disease)   . Stroke   . GERD (gastroesophageal reflux disease)    Past Surgical History  Procedure Laterality Date  . Breast surgery     Family History  Problem Relation Age of Onset  . Breast cancer Mother   . Stroke Mother   . Stroke Father   . Breast cancer Sister   . Hypertension Sister   . Hypertension Sister   . Dementia Sister    Social History:  reports that she has been smoking.  She does not have any smokeless tobacco history on file. She reports that she drinks alcohol. She reports that she does not use illicit drugs. Allergies:  Allergies  Allergen Reactions  . Codeine     REACTION: causes skin to feel like it's crawling  . Levofloxacin     REACTION: nausea   No prescriptions prior to admission    Home: Home Living Family/patient expects to  be discharged to:: Other (Comment) (Independent living apartment) Available Help at Discharge: Family;Available PRN/intermittently Type of Home: Apartment  Lives With: Alone  Functional History: Prior Function Vocation: Retired Functional Status:  Mobility: Bed Mobility Bed Mobility: Supine to Sit;Sitting - Scoot to Edge of Bed Supine to Sit: 4: Min guard;With rails Sitting - Scoot to Edge of Bed: 4: Min guard Transfers Transfers: Sit to Stand;Stand to Sit Sit to Stand: 4: Min guard;From bed;From toilet Stand to Sit: To bed;To toilet;4: Min Orthoptist Transfers: 4: Min assist Ambulation/Gait Ambulation/Gait Assistance: 4: Min guard Ambulation Distance (Feet): 30 Feet Assistive device: Rolling walker Ambulation/Gait Assistance Details: Ambulation limited today due to pt leaving for vascular test; however pt motivated to ambulate this pm. Cues for RW safety and R knee instability Gait Pattern: Step-through pattern;Decreased stride length    ADL:    Cognition: Cognition Overall Cognitive Status: Within Functional Limits for tasks assessed Arousal/Alertness: Awake/alert Orientation Level: Oriented X4 Attention: Alternating;Divided Alternating Attention: Impaired Alternating Attention Impairment: Verbal complex;Functional complex Divided Attention: Impaired Divided Attention Impairment: Verbal complex;Functional complex Memory: Appears intact Awareness: Impaired Awareness Impairment: Anticipatory impairment Problem Solving: Appears intact Safety/Judgment: Impaired Cognition Arousal/Alertness: Awake/alert Behavior During Therapy: WFL for tasks assessed/performed Overall Cognitive Status: Within Functional Limits for tasks assessed  Blood pressure 149/94, pulse 76, temperature 97.8 F (36.6 C), temperature source Oral, resp. rate 16, height 5\' 5"  (1.651 m), weight 66.4 kg (146 lb 6.2 oz), SpO2 96.00%. Physical Exam  Constitutional: She is oriented to person, place,  and time.  HENT:  Head: Normocephalic.  Eyes: EOM are normal.  Neck: Normal range of motion. Neck supple. No thyromegaly present.  Cardiovascular: Normal rate and regular rhythm.   Respiratory: Effort normal and breath sounds normal.  GI: Soft. Bowel sounds are normal. She exhibits no distension.  Neurological: She is alert and oriented to person, place, and time.  Follows full commands. Some dysarthria but a lot of this is due to her dentures being out. Right limb ataxia, upper and lower. Strength RUE is 4 to 4+, RLE is 4+ to 5/5. Cognitively appropriate. No sensory deficit.s   Skin: Skin is warm and dry.  Psychiatric: She has a normal mood and affect. Her behavior is normal. Judgment and thought content normal.    Results for orders placed during the hospital encounter of 11/13/13 (from the past 24 hour(s))  BASIC METABOLIC PANEL     Status: Abnormal   Collection Time    11/14/13  3:15 PM      Result Value Range   Sodium 139  135 - 145 mEq/L   Potassium 4.0  3.5 - 5.1 mEq/L   Chloride 107  96 - 112 mEq/L   CO2 23  19 - 32 mEq/L   Glucose, Bld 116 (*) 70 - 99 mg/dL   BUN 10  6 - 23 mg/dL   Creatinine, Ser 4.69  0.50 - 1.10 mg/dL   Calcium 9.2  8.4 - 62.9 mg/dL   GFR calc non Af Amer 90 (*) >90 mL/min   GFR calc Af Amer >90  >90 mL/min   Dg Chest 2 View  11/13/2013   CLINICAL DATA:  Knee instability.  Sinus congestion.  Smoker.  EXAM: CHEST  2 VIEW  COMPARISON:  02/07/2010 and chest CT dated 01/29/2011.  FINDINGS: The heart remains normal in size. The lungs are mildly progressively hyperexpanded with mildly progressive diffuse peribronchial thickening and accentuation of the interstitial markings. Diffuse osteopenia.  IMPRESSION: Progressive changes of COPD and chronic bronchitis. No acute abnormality.   Electronically Signed   By: Gordan Payment M.D.   On: 11/13/2013 14:47   Ct Head Wo Contrast  11/13/2013   CLINICAL DATA:  Right-sided weakness since 11/08/2013.  EXAM: CT HEAD WITHOUT  CONTRAST  TECHNIQUE: Contiguous axial images were obtained from the base of the skull through the vertex without intravenous contrast.  COMPARISON:  05/29/2007  FINDINGS: No mass lesion. No midline shift. No acute hemorrhage or hematoma. No extra-axial fluid collections. No evidence of acute infarction. There is fairly extensive periventricular white matter lucency consistent with chronic small vessel ischemic disease, slightly progressed since the prior study. Osseous structures are normal and unchanged.  IMPRESSION: No acute intracranial abnormality. Extensive chronic small vessel ischemic changes, slightly progressed.   Electronically Signed   By: Geanie Cooley M.D.   On: 11/13/2013 14:23   Mr Maxine Glenn Head Wo Contrast  11/13/2013   CLINICAL DATA:  Right-sided weakness. Abnormal gait. Slurred speech.  EXAM: MRI HEAD WITHOUT CONTRAST  MRA HEAD WITHOUT CONTRAST  TECHNIQUE: Multiplanar, multiecho pulse sequences of the brain and surrounding structures were obtained without intravenous contrast. Angiographic images of the head were obtained using MRA technique without contrast.  COMPARISON:  11/13/2013 and 05/29/2007 CT.  FINDINGS: MRI HEAD FINDINGS  Acute nonhemorrhagic infarct posterior limb left internal capsule.  Prominent small vessel disease type changes.  Left calvarial 1 cm lucency may represent a hemangioma (unchanged by CT since 2008). Otherwise no evidence of intracranial mass lesion noted on this unenhanced exam.  Cerebellar tonsils minimally low lying but within the range of  normal limits.  Pituitary region, pineal region and orbital structures unremarkable.  MRA HEAD FINDINGS  Anterior circulation without medium or large size vessel significant stenosis or occlusion.  Fetal type contribution to the posterior circulation bilaterally.  Ectasia of the anterior communicating artery region without saccular aneurysm noted.  Middle cerebral artery Mild branch vessel irregularity bilaterally.  Right vertebral  artery is dominant. No significant stenosis of the vertebral arteries or basilar artery.  Mild irregularity of the posterior inferior cerebellar artery bilaterally.  Non visualization of the anterior inferior cerebellar artery bilaterally.  Bulbous appearance of the basilar tip from which superior cerebral arteries and posterior cerebral arteries arise without discrete saccular aneurysm.  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  Prominent small vessel disease type changes.  Mild intracranial atherosclerotic type changes as detailed above.  These results were called by telephone at the time of interpretation on 11/13/2013 at 7:13 PM to Dr. Vanetta Mulders , who verbally acknowledged these results.   Electronically Signed   By: Bridgett Larsson M.D.   On: 11/13/2013 19:29   Mr Brain Wo Contrast  11/13/2013   CLINICAL DATA:  Right-sided weakness. Abnormal gait. Slurred speech.  EXAM: MRI HEAD WITHOUT CONTRAST  MRA HEAD WITHOUT CONTRAST  TECHNIQUE: Multiplanar, multiecho pulse sequences of the brain and surrounding structures were obtained without intravenous contrast. Angiographic images of the head were obtained using MRA technique without contrast.  COMPARISON:  11/13/2013 and 05/29/2007 CT.  FINDINGS: MRI HEAD FINDINGS  Acute nonhemorrhagic infarct posterior limb left internal capsule.  Prominent small vessel disease type changes.  Left calvarial 1 cm lucency may represent a hemangioma (unchanged by CT since 2008). Otherwise no evidence of intracranial mass lesion noted on this unenhanced exam.  Cerebellar tonsils minimally low lying but within the range of normal limits.  Pituitary region, pineal region and orbital structures unremarkable.  MRA HEAD FINDINGS  Anterior circulation without medium or large size vessel significant stenosis or occlusion.  Fetal type contribution to the posterior circulation bilaterally.  Ectasia of the anterior communicating artery region without  saccular aneurysm noted.  Middle cerebral artery Mild branch vessel irregularity bilaterally.  Right vertebral artery is dominant. No significant stenosis of the vertebral arteries or basilar artery.  Mild irregularity of the posterior inferior cerebellar artery bilaterally.  Non visualization of the anterior inferior cerebellar artery bilaterally.  Bulbous appearance of the basilar tip from which superior cerebral arteries and posterior cerebral arteries arise without discrete saccular aneurysm.  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  Prominent small vessel disease type changes.  Mild intracranial atherosclerotic type changes as detailed above.  These results were called by telephone at the time of interpretation on 11/13/2013 at 7:13 PM to Dr. Vanetta Mulders , who verbally acknowledged these results.   Electronically Signed   By: Bridgett Larsson M.D.   On: 11/13/2013 19:29   Dg Knee Complete 4 Views Right  11/13/2013   CLINICAL DATA:  Right knee instability.  Previous knee surgery.  EXAM: RIGHT KNEE - COMPLETE 4+ VIEW  COMPARISON:  02/13/2009 and MR dated 04/18/2009.  FINDINGS: Mild lateral joint space narrowing. No fracture or effusion. The bones appear osteopenic.  IMPRESSION: No acute abnormality.   Electronically Signed   By: Gordan Payment M.D.   On: 11/13/2013 14:46    Assessment/Plan: Diagnosis: left PLIC infarct 1. Does the need for close, 24 hr/day medical supervision in concert with the patient's rehab needs make it unreasonable for this patient  to be served in a less intensive setting? No and Potentially 2. Co-Morbidities requiring supervision/potential complications: htn, copd 3. Due to bladder management, bowel management, safety, skin/wound care, disease management, medication administration and patient education, does the patient require 24 hr/day rehab nursing? No and Potentially 4. Does the patient require coordinated care of a physician, rehab nurse,  PT (1-2 hrs/day, 5 days/week) and OT (1-2 hrs/day, 5 days/week) to address physical and functional deficits in the context of the above medical diagnosis(es)? Potentially Addressing deficits in the following areas: balance, endurance, locomotion, strength, transferring, bowel/bladder control, bathing, dressing, feeding, grooming and toileting 5. Can the patient actively participate in an intensive therapy program of at least 3 hrs of therapy per day at least 5 days per week? Potentially 6. The potential for patient to make measurable gains while on inpatient rehab is fair 7. Anticipated functional outcomes upon discharge from inpatient rehab are n/a with PT, n/a with OT, n/a with SLP. 8. Estimated rehab length of stay to reach the above functional goals is: n/a 9. Does the patient have adequate social supports to accommodate these discharge functional goals? Potentially 10. Anticipated D/C setting: Home 11. Anticipated post D/C treatments: HH therapy 12. Overall Rehab/Functional Prognosis: excellent  RECOMMENDATIONS: This patient's condition is appropriate for continued rehabilitative care in the following setting: HH (see below) Patient has agreed to participate in recommended program. Potentially Note that insurance prior authorization may be required for reimbursement for recommended care.  Comment: Pt is already at contact guard assist level.  She REALLY wants to go home. If she has someone who can be available to assist during the day (with daughters perhaps checking in on her at night), she could go home with home health therapies I believe. Would be hard pressed to justify an inpatient rehab stay at her current functional level.   Ranelle Oyster, MD, [facility] Physical Medicine & Rehabilitation     11/15/2013

## 2013-11-15 NOTE — Consult Note (Signed)
CARDIOLOGY CONSULT NOTE   Patient ID: Michaela Guerra MRN: [id number] DOB/AGE: [birth year] 67 y.o.  Admit date: 11/13/2013  Primary Physician   Rene Paci, MD Primary Cardiologist   New - was [facility] in 2007 Reason for Consultation   ?NSVT  WUJ:WJXBJ L Michaela Guerra is a 67 y.o. female with a history of non-obstructive CAD and preserved LV function in 2007. She was admitted 12/6 with a CVA, possible 3 beat run NSVT seen on telemetry and cardiology asked to evaluate her.   Pt has a long history of heart skipping. It will skip once or twice and stop. She gets no symptoms from the skipping. The skipping does not happen every day.  She came to the hospital 12/6 pm. She was admitted, and her potassium was noted to be low (3.3). Magnesium was 1.9. She received 4-10 meq runs of KCl over about 8 hours. After the potassium runs finished, she developed heart skipping. She could feel her heart flopping frequently, it made her feel she could not take a deep breath (but she denies feeling short of breath). It did not hurt, she did not have presyncope with this. The symptoms and the skipping finally resolved. She has never had anything like this before.   She never gets chest pain with exertion. She has chronic cough and DOE, but no recent change. No edema. She cannot sleep on her back because she feels her tongue falls back and blocks her airway. She has no recent illnesses, fevers or chills.  Past Medical History  Diagnosis Date  . Hypothyroidism   . Breast cancer     Adenocarcinoma  . Hypertension   . COPD (chronic obstructive pulmonary disease)   . Stroke 11/2013  . GERD (gastroesophageal reflux disease)      Past Surgical History  Procedure Laterality Date  . Breast surgery    . Cardiac catheterization  2007    EF 60-70%, LAD 20%, D1 15%, CFX system OK, RCA OK. PLV diffuse disease   Allergies  Allergen Reactions  . Codeine     REACTION: causes skin to feel like it's crawling  .  Levofloxacin     REACTION: nausea   I have reviewed the patient's current medications . aspirin EC  81 mg Oral 3 times weekly  . levothyroxine  25 mcg Oral QAC breakfast  . nicotine  21 mg Transdermal Daily  . simvastatin  10 mg Oral q1800   . sodium chloride 75 mL/hr at 11/13/13 2200   acetaminophen, acetaminophen, albuterol, hydrALAZINE, senna-docusate  Prior to Admission medications   None - Pt stopped 5 months ago, was on thyroid med, statin and anti-HTN med.     History   Social History  . Marital Status: Single    Spouse Name: N/A    Number of Children: N/A  . Years of Education: N/A   Occupational History  . Retired    Social History Main Topics  . Smoking status: Current Every Day Smoker -- 3.00 packs/day for 40 years  . Smokeless tobacco: Not on file     Comment: Has cut back to 1 ppd  . Alcohol Use: Yes     Comment: once every two weeks  . Drug Use: No  . Sexual Activity: Not on file   Other Topics Concern  . Not on file   Social History Narrative   Lives alone.    Family Status  Relation Status Death Age  . Mother Deceased   .  Father Deceased   . Sister Deceased   . Sister Alive    Family History  Problem Relation Age of Onset  . Breast cancer Mother   . Stroke Mother   . Stroke Father   . Breast cancer Sister   . Hypertension Sister   . Hypertension Sister   . Dementia Sister      ROS:  Full 14 point review of systems complete and found to be negative unless listed above.  Physical Exam: Blood pressure 148/82, pulse 74, temperature 98 F (36.7 C), temperature source Oral, resp. rate 18, height 5\' 5"  (1.651 m), weight 146 lb 6.2 oz (66.4 kg), SpO2 95.00%.  General: Well developed, well nourished, female in no acute distress Head: Eyes PERRLA, No xanthomas.   Normocephalic and atraumatic, oropharynx without edema or exudate. Dentition: poor Lungs: few dry rales Heart: HRRR S1 S2, no rub/gallop, no murmur. pulses are 2+ all 4 extrem.     Neck: No carotid bruits. No lymphadenopathy.  JVD not elevated. Abdomen: Bowel sounds present, abdomen soft and non-tender without masses or hernias noted. Msk:  No spine or cva tenderness. No weakness, no joint deformities or effusions. Extremities: No clubbing or cyanosis. no edema.  Neuro: Alert and oriented X 3. No focal deficits noted. Psych:  Good affect, responds appropriately Skin: No rashes or lesions noted.  Labs:   Lab Results  Component Value Date   WBC 8.0 11/13/2013   HGB 15.4* 11/13/2013   HCT 44.9 11/13/2013   MCV 92.4 11/13/2013   PLT 262 11/13/2013     Recent Labs Lab 11/13/13 1334 11/14/13 1515  NA 141 139  K 3.3* 4.0  CL 103 107  CO2 26 23  BUN 11 10  CREATININE 0.72 0.64  CALCIUM 9.7 9.2  PROT 6.9  --   BILITOT 0.7  --   ALKPHOS 79  --   ALT 13  --   AST 13  --   GLUCOSE 137* 116*  ALBUMIN 3.7  --    Magnesium  Date Value Range Status  11/14/2013 1.9  1.5 - 2.5 mg/dL Final   Lab Results  Component Value Date   CHOL 202* 11/14/2013   HDL 42 11/14/2013   LDLCALC 135* 11/14/2013   TRIG 126 11/14/2013   TSH  Date/Time Value Range Status  11/14/2013  2:35 AM 18.155* 0.350 - 4.500 uIU/mL Final     Performed at [facility]   Lab Results  Component Value Date   HGBA1C 6.2* 11/14/2013    Cardiac Cath: 09/04/2006, Dr. Sharyn Lull FINDINGS:  1. The LV showed good LV systolic function.  2. EF of 60-70%.  3. The left main was patent.  4. LAD has 15-20% proximal and 10-15% mid stenosis.  5. Diagonal-one has 10-15% proximal stenosis. It is a long vessel but  small in caliber.  6. Left circumflex is patent.  7. OM-1 is very very small.  8. OM-2 is small which is patent.  9. OM-3 and OM-4 are very very small.  10.Ramus is very small which is patent.  11.RCA is patent.  12.PDA is small which is diffusely diseased distally.  13.PLV branches are also very small. They are diffusely diseased  distally.  Echo: 11/15/2013 Transthoracic  Echocardiography Study Conclusions - Left ventricle: The cavity size was normal. Wall thickness was normal. Systolic function was vigorous. The estimated ejection fraction was in the range of 65% to 70%. Wall motion was normal; there were no regional wall motion abnormalities. Doppler  parameters are consistent with abnormal left ventricular relaxation (grade 1 diastolic dysfunction). - Aortic valve: There was no stenosis. - Mitral valve: Mildly calcified annulus. - Right ventricle: The cavity size was normal. Systolic function was normal. - Pulmonary arteries: No complete TR doppler jet so unable to estimate PA systolic pressure. - Inferior vena cava: The vessel was normal in size; the respirophasic diameter changes were in the normal range (= 50%); findings are consistent with normal central venous pressure. Impressions: - Normal LV size with vigorous systolic function, EF 65-70%. Normal RV size and systolic function. No significant valvular abnormalities.   ECG:  11/14/2013 SR Vent. rate 71 BPM PR interval 190 ms QRS duration 78 ms QT/QTc 402/436 ms P-R-T axes 77 -45 70  Radiology:  Dg Chest 2 View 11/13/2013   CLINICAL DATA:  Knee instability.  Sinus congestion.  Smoker.  EXAM: CHEST  2 VIEW  COMPARISON:  02/07/2010 and chest CT dated 01/29/2011.  FINDINGS: The heart remains normal in size. The lungs are mildly progressively hyperexpanded with mildly progressive diffuse peribronchial thickening and accentuation of the interstitial markings. Diffuse osteopenia.  IMPRESSION: Progressive changes of COPD and chronic bronchitis. No acute abnormality.   Electronically Signed   By: Gordan Payment M.D.   On: 11/13/2013 14:47   Ct Head Wo Contrast 11/13/2013   CLINICAL DATA:  Right-sided weakness since 11/08/2013.  EXAM: CT HEAD WITHOUT CONTRAST  TECHNIQUE: Contiguous axial images were obtained from the base of the skull through the vertex without intravenous contrast.  COMPARISON:   05/29/2007  FINDINGS: No mass lesion. No midline shift. No acute hemorrhage or hematoma. No extra-axial fluid collections. No evidence of acute infarction. There is fairly extensive periventricular white matter lucency consistent with chronic small vessel ischemic disease, slightly progressed since the prior study. Osseous structures are normal and unchanged.  IMPRESSION: No acute intracranial abnormality. Extensive chronic small vessel ischemic changes, slightly progressed.   Electronically Signed   By: Geanie Cooley M.D.   On: 11/13/2013 14:23   Mr Brain Wo Contrast 11/13/2013   CLINICAL DATA:  Right-sided weakness. Abnormal gait. Slurred speech.  EXAM: MRI HEAD WITHOUT CONTRAST  MRA HEAD WITHOUT CONTRAST  TECHNIQUE: Multiplanar, multiecho pulse sequences of the brain and surrounding structures were obtained without intravenous contrast. Angiographic images of the head were obtained using MRA technique without contrast.  COMPARISON:  11/13/2013 and 05/29/2007 CT.  FINDINGS: MRI HEAD FINDINGS  Acute nonhemorrhagic infarct posterior limb left internal capsule.  Prominent small vessel disease type changes.  Left calvarial 1 cm lucency may represent a hemangioma (unchanged by CT since 2008). Otherwise no evidence of intracranial mass lesion noted on this unenhanced exam.  Cerebellar tonsils minimally low lying but within the range of normal limits.  Pituitary region, pineal region and orbital structures unremarkable.  MRA HEAD FINDINGS  Anterior circulation without medium or large size vessel significant stenosis or occlusion.  Fetal type contribution to the posterior circulation bilaterally.  Ectasia of the anterior communicating artery region without saccular aneurysm noted.  Middle cerebral artery Mild branch vessel irregularity bilaterally.  Right vertebral artery is dominant. No significant stenosis of the vertebral arteries or basilar artery.  Mild irregularity of the posterior inferior cerebellar artery  bilaterally.  Non visualization of the anterior inferior cerebellar artery bilaterally.  Bulbous appearance of the basilar tip from which superior cerebral arteries and posterior cerebral arteries arise without discrete saccular aneurysm.  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  Prominent small vessel disease type  changes.  Mild intracranial atherosclerotic type changes as detailed above.  These results were called by telephone at the time of interpretation on 11/13/2013 at 7:13 PM to Dr. Vanetta Mulders , who verbally acknowledged these results.   Electronically Signed   By: Bridgett Larsson M.D.   On: 11/13/2013 19:29   Dg Knee Complete 4 Views Right 11/13/2013   CLINICAL DATA:  Right knee instability.  Previous knee surgery.  EXAM: RIGHT KNEE - COMPLETE 4+ VIEW  COMPARISON:  02/13/2009 and MR dated 04/18/2009.  FINDINGS: Mild lateral joint space narrowing. No fracture or effusion. The bones appear osteopenic.  IMPRESSION: No acute abnormality.   Electronically Signed   By: Gordan Payment M.D.   On: 11/13/2013 14:46    ASSESSMENT AND PLAN:   The patient was seen today by Dr Myrtis Ser, the patient evaluated and the data reviewed.   NSVT: Frequent PVCs seen in the setting of acute CVA, hypokalemia and untreated hypothyroidism. As her hypokalemia has been treated and Synthroid is being started, they have improved. She has no history of ischemic symptoms and her EF is preserved with no WMA.   Plan: from a cardiac standpoint, no further inpatient workup is indicated. MD advise on event monitor vs followup appointment in the office after thyroid issues are treated and decide on further evaluation at that time.   Tobacco use - cessation discussed and strongly advised.  Otherwise, per primary MD and Neurology Active Problems:   HYPERTENSION   COPD   CVA (cerebral infarction)   Hypokalemia   CVA (cerebral vascular accident)   Signed: Theodore Demark, PA-C 11/15/2013 11:36  AM Beeper [phone] Patient seen and examined. I agree with the assessment and plan as detailed above. See also my additional thoughts below.   I have reviewed all data. At this time, there is now evidence of a significant arrythmia. Plan to follow and treat electrolytes. There is good LV function. No further work - up.  Willa Rough, MD, [facility] 11/15/2013 1:33 PM

## 2013-11-15 NOTE — Evaluation (Signed)
Occupational Therapy Evaluation Patient Details Name: [name] MRN: [id number] DOB: 08-17-1946 Today's Date: 11/15/2013 Time: 1207-1227 OT Time Calculation (min): 20 min  OT Assessment / Plan / Recommendation History of present illness Michaela Guerra is an 67 y.o. female who reports that she awakened on Tuesday and noticed that she was having difficulty with gait.  She also noticed some difficulty with her right side specifically.  Her arm has gotten a little better but with the majority of her symptoms still present today she presented for medical evaluation.  She has not experienced paresthesias.  Acute nonhemorrhagic infarct posterior limb left internal capsule   Clinical Impression   Pt presents with below problem list. Pt independent with ADLs, PTA. Pt will benefit from acute OT to increase independence prior to d/c.     OT Assessment  Patient needs continued OT Services    Follow Up Recommendations  CIR;Supervision/Assistance - 24 hour    Barriers to Discharge      Equipment Recommendations  Other (comment) (defer to next venue)    Recommendations for Other Services Rehab consult  Frequency  Min 2X/week    Precautions / Restrictions Precautions Precautions: Fall   Pertinent Vitals/Pain No pain reported.    ADL  Eating/Feeding: Set up Where Assessed - Eating/Feeding: Edge of bed Grooming: Minimal assistance;Wash/dry hands;Brushing hair Where Assessed - Grooming: Supported standing Upper Body Bathing: Set up;Supervision/safety Where Assessed - Upper Body Bathing: Unsupported sitting Lower Body Bathing: Min guard Where Assessed - Lower Body Bathing: Supported sit to stand Upper Body Dressing: Minimal assistance Where Assessed - Upper Body Dressing: Unsupported sitting Lower Body Dressing: Minimal assistance Where Assessed - Lower Body Dressing: Supported sit to Pharmacist, hospital: Hydrographic surveyor Method: Sit to Barista:  Regular height toilet;Comfort height toilet Toileting - Architect and Hygiene: Min guard Where Assessed - Engineer, mining and Hygiene: Sit to stand from 3-in-1 or toilet Tub/Shower Transfer Method: Not assessed Equipment Used: Gait belt;Rolling walker Transfers/Ambulation Related to ADLs: Min guard for ambulation and transfers. ADL Comments: Educated on signs and symptoms of stroke and importance of getting help right away. Educated on reducing sodium intake and to quit smoking as it increase chance for stroke. Encouraged pt to use RUE during activities. Had pt comb hair with Rt hand and helped her position it correctly.  Recommended sitting for bathing and also dressing. Educated on gross motor coordination activities she could do.    OT Diagnosis: Hemiplegia non-dominant side  OT Problem List: Decreased strength;Decreased activity tolerance;Impaired balance (sitting and/or standing);Decreased knowledge of use of DME or AE;Decreased knowledge of precautions;Impaired UE functional use;Decreased coordination OT Treatment Interventions: Self-care/ADL training;Therapeutic exercise;Neuromuscular education;DME and/or AE instruction;Therapeutic activities;Patient/family education;Balance training   OT Goals(Current goals can be found in the care plan section) Acute Rehab OT Goals Patient Stated Goal: not stated OT Goal Formulation: With patient Time For Goal Achievement: 11/22/13 Potential to Achieve Goals: Good ADL Goals Pt Will Perform Lower Body Bathing: with modified independence;sit to/from stand Pt Will Perform Lower Body Dressing: with modified independence;sit to/from stand Pt Will Transfer to Toilet: with modified independence;ambulating (3 in 1 over commode) Pt Will Perform Toileting - Clothing Manipulation and hygiene: with modified independence;sit to/from stand Additional ADL Goal #1: Pt will independently perform fine motor activities/exercises with RUE to  increase strength and coordination.  Visit Information  Last OT Received On: 11/15/13 Assistance Needed: +1 History of Present Illness: Michaela Guerra is an 67 y.o. female  who reports that she awakened on Tuesday and noticed that she was having difficulty with gait.  She also noticed some difficulty with her right side specifically.  Her arm has gotten a little better but with the majority of her symptoms still present today she presented for medical evaluation.  She has not experienced paresthesias.  Acute nonhemorrhagic infarct posterior limb left internal capsule       Prior Functioning     Home Living Family/patient expects to be discharged to:: Assisted living Available Help at Discharge: Family;Available PRN/intermittently Type of Home: Apartment Home Equipment: Grab bars - tub/shower  Lives With: Alone Prior Function Level of Independence: Independent Communication Communication: No difficulties Dominant Hand: Left         Vision/Perception Vision - History Baseline Vision: Wears glasses all the time Visual History:  (lazy left eye) Patient Visual Report: Other (comment) (states her current glasses prescription is wrong) Vision - Assessment Vision Assessment: Vision tested Tracking/Visual Pursuits: Other (comment) (diffculty in left upper quadrant) Visual Fields: No apparent deficits   Cognition  Cognition Arousal/Alertness: Awake/alert Behavior During Therapy: WFL for tasks assessed/performed Overall Cognitive Status: Within Functional Limits for tasks assessed    Extremity/Trunk Assessment Upper Extremity Assessment Upper Extremity Assessment: RUE deficits/detail RUE Deficits / Details: Weakness in RUE compared to left. RUE Coordination: decreased fine motor;decreased gross motor Lower Extremity Assessment Lower Extremity Assessment: Defer to PT evaluation     Mobility Bed Mobility Bed Mobility: Supine to Sit;Sitting - Scoot to Edge of Bed;Sit to  Supine Supine to Sit: 5: Supervision;HOB flat Sitting - Scoot to Edge of Bed: 5: Supervision Sit to Supine: 5: Supervision Details for Bed Mobility Assistance: Supervision for safety. Transfers Transfers: Sit to Stand;Stand to Sit Sit to Stand: 4: Min guard;With upper extremity assist;From bed;From toilet Stand to Sit: 4: Min guard;To bed;To toilet Details for Transfer Assistance: Cues for technique and hand placement.     Exercise     Balance     End of Session OT - End of Session Equipment Utilized During Treatment: Gait belt;Rolling walker Activity Tolerance: Patient tolerated treatment well Patient left: in bed;with call bell/phone within reach;with bed alarm set  GO     Earlie Raveling OTR/L [phone] 11/15/2013, 1:40 PM

## 2013-11-15 NOTE — Progress Notes (Signed)
Stroke Team Progress Note  HISTORY Michaela Guerra is a 67 y.o. female who reported that she awakened on Tuesday, 11/09/2013,  and noticed that she was having difficulty with her gait. She also noticed some difficulty with her right side specifically. Her arm had gotten a little better but with the majority of her symptoms still present 11/13/2013 she presented for medical evaluation. She had not experienced paresthesias. She was not a tPA candidate secondary to delay in arrival. She was admitted for further stroke evaluation.   SUBJECTIVE No family at the bedside.   OBJECTIVE Most recent Vital Signs: Filed Vitals:   11/14/13 2130 11/15/13 0200 11/15/13 0550 11/15/13 1000  BP: 153/95 148/77 149/94 148/82  Pulse: 75 79 76 74  Temp: 97.6 F (36.4 C) 97.8 F (36.6 C) 97.8 F (36.6 C) 98 F (36.7 C)  TempSrc: Oral Oral Oral Oral  Resp: 18 16 16 18   Height:      Weight:      SpO2: 94% 97% 96% 95%   CBG (last 3)  No results found for this basename: GLUCAP,  in the last 72 hours  IV Fluid Intake:   . sodium chloride 75 mL/hr at 11/13/13 2200    MEDICATIONS  . aspirin EC  81 mg Oral 3 times weekly  . levothyroxine  25 mcg Oral QAC breakfast  . nicotine  21 mg Transdermal Daily  . simvastatin  10 mg Oral q1800   PRN:  acetaminophen, acetaminophen, albuterol, hydrALAZINE, senna-docusate  Diet:  Cardiac thin liquids Activity:   Up with assistance DVT Prophylaxis:  SCDs  CLINICALLY SIGNIFICANT STUDIES Basic Metabolic Panel:   Recent Labs Lab 11/13/13 1334 11/14/13 0235 11/14/13 1515  NA 141  --  139  K 3.3*  --  4.0  CL 103  --  107  CO2 26  --  23  GLUCOSE 137*  --  116*  BUN 11  --  10  CREATININE 0.72  --  0.64  CALCIUM 9.7  --  9.2  MG  --  1.9  --    Liver Function Tests:   Recent Labs Lab 11/13/13 1334  AST 13  ALT 13  ALKPHOS 79  BILITOT 0.7  PROT 6.9  ALBUMIN 3.7   CBC:   Recent Labs Lab 11/13/13 1334  WBC 8.0  NEUTROABS 5.0  HGB 15.4*  HCT  44.9  MCV 92.4  PLT 262   Coagulation: No results found for this basename: LABPROT, INR,  in the last 168 hours Cardiac Enzymes: No results found for this basename: CKTOTAL, CKMB, CKMBINDEX, TROPONINI,  in the last 168 hours Urinalysis:   Recent Labs Lab 11/13/13 1450  COLORURINE YELLOW  LABSPEC 1.012  PHURINE 7.0  GLUCOSEU NEGATIVE  HGBUR NEGATIVE  BILIRUBINUR NEGATIVE  KETONESUR NEGATIVE  PROTEINUR NEGATIVE  UROBILINOGEN 0.2  NITRITE NEGATIVE  LEUKOCYTESUR NEGATIVE   Lipid Panel    Component Value Date/Time   CHOL 202* 11/14/2013 0235   TRIG 126 11/14/2013 0235   HDL 42 11/14/2013 0235   CHOLHDL 4.8 11/14/2013 0235   VLDL 25 11/14/2013 0235   LDLCALC 135* 11/14/2013 0235   HgbA1C  Lab Results  Component Value Date   HGBA1C 6.2* 11/14/2013    Urine Drug Screen:   No results found for this basename: labopia,  cocainscrnur,  labbenz,  amphetmu,  thcu,  labbarb    Alcohol Level: No results found for this basename: ETH,  in the last 168 hours  Dg Chest 2 View  11/13/2013    Progressive changes of COPD and chronic bronchitis. No acute abnormality.     Ct Head Wo Contrast 11/13/2013    No acute intracranial abnormality. Extensive chronic small vessel ischemic changes, slightly progressed.     Mri / Mra Head Wo Contrast 11/13/2013    Acute nonhemorrhagic infarct posterior limb left internal capsule.  Prominent small vessel disease type changes.  Mild intracranial atherosclerotic type changes.  Dg Knee Complete 4 Views Right 11/13/2013    No acute abnormality.     2D Echocardiogram  EF 65-70% with no source of embolus.   Carotid Doppler  No evidence of hemodynamically significant internal carotid artery stenosis. Vertebral artery flow is antegrade.   EKG  sinus rhythm rate 79 beats per minute  Therapy Recommendations CIR   Neurologic Examination:  Mental Status:  Alert, oriented, thought content appropriate. Speech fluent without evidence of aphasia. Able to  follow 3 step commands without difficulty.  Cranial Nerves:  II: Discs flat bilaterally; Visual fields grossly normal, pupils equal, round, reactive to light and accommodation  III,IV, VI: ptosis not present, extra-ocular motions intact bilaterally  V,VII: decrease in right NLF, facial light touch sensation normal bilaterally  VIII: hearing normal bilaterally  IX,X: gag reflex present  XI: bilateral shoulder shrug  XII: midline tongue extension  Motor:  Right : Upper extremity 5/5 Left: Upper extremity 4+/5 with pronator drift  Lower extremity 5/5 Lower extremity 5-/5  Tone and bulk:normal tone throughout; no atrophy noted  Sensory: Pinprick and light touch intact throughout, bilaterally  Deep Tendon Reflexes: 2+ and symmetric with 1+ AJ's bilaterally  Plantars:  Right: upgoing Left: upgoing  Cerebellar:  normal finger-to-nose, dysmetria with hell to shin on the right  Gait: Unable to test  CV: pulses palpable throughout    ASSESSMENT Ms. Michaela Guerra is a 67 y.o. female presenting with left upper extremity paresis and gait instability. The patient was outside the time window for TPA. An MRI revealed an acute nonhemorrhagic infarct posterior limb left internal capsule. Infarct felt to be thrombotic secondary to small vessel disease. On no antithrombotics prior to admission. Now on aspirin 81 mg M, W, F for secondary stroke prevention. Patient with resultant mild left upper extremity paresis. Work up completed.   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  Per patient she does have a previous intolerance to aspirin secondary to bruising.   Hospital day # 2  TREATMENT/PLAN  Continue aspirin to 81 mg Monday Wednesday and Fridays  for secondary stroke prevention.  Continue Statin added (low dose per patient request) No further stroke workup indicated. Patient has a 10-15% risk of having another stroke over the next year, the highest risk is within 2 weeks of the most recent stroke/TIA (risk of having a stroke following a stroke or TIA is the same). Ongoing risk factor control by Primary Care Physician Stroke Service will sign off. Please call should any needs arise. Follow up with Dr. Pearlean Brownie, Stroke Clinic, in 2 months.  Annie Main, MSN, RN, ANVP-BC, ANP-BC, [facility] Pager: [phone] 11/15/2013 11:37 AM  I have personally obtained a history, examined the patient, evaluated imaging results, and formulated the assessment and plan of care. I agree with the above.  Delia Heady, MD

## 2013-11-15 NOTE — Progress Notes (Signed)
*  PRELIMINARY RESULTS* Echocardiogram 2D Echocardiogram has been performed.  Jeryl Columbia 11/15/2013, 10:03 AM

## 2013-11-15 NOTE — Progress Notes (Signed)
Physical Therapy Treatment Patient Details Name: Michaela Guerra MRN: [id number] DOB: September 15, 1946 Today's Date: 11/15/2013 Time: 4098-1191 PT Time Calculation (min): 15 min  PT Assessment / Plan / Recommendation  History of Present Illness Michaela Guerra is an 67 y.o. female who reports that she awakened on Tuesday and noticed that she was having difficulty with gait.  She also noticed some difficulty with her right side specifically.  Her arm has gotten a little better but with the majority of her symptoms still present today she presented for medical evaluation.  She has not experienced paresthesias.  Acute nonhemorrhagic infarct posterior limb left internal capsule   PT Comments   Pt tx limited today due to pt leaving for vascular test; however pt was motivated to participate in PT. Cues for safety with RW negotiation.  Pt completed standing balance at sink in bathroom with instability however no LOB.   Pt will greatly benefit from CIR to increase functional I prior to d/c home.  Follow Up Recommendations  CIR     Does the patient have the potential to tolerate intense rehabilitation     Barriers to Discharge        Equipment Recommendations  Rolling walker with 5" wheels;3in1 (PT)    Recommendations for Other Services OT consult;Rehab consult  Frequency Min 4X/week   Progress towards PT Goals Progress towards PT goals: Progressing toward goals  Plan Current plan remains appropriate    Precautions / Restrictions Precautions Precautions: Fall   Pertinent Vitals/Pain no apparent distress     Mobility  Bed Mobility Bed Mobility: Supine to Sit;Sitting - Scoot to Edge of Bed Supine to Sit: 4: Min guard;With rails Sitting - Scoot to Edge of Bed: 4: Min guard Details for Bed Mobility Assistance: min guard for safety Transfers Transfers: Sit to Stand;Stand to Sit Sit to Stand: 4: Min guard;From bed;From toilet Stand to Sit: To bed;To toilet;4: Min guard Details for Transfer  Assistance: cues for hand placement and technique Ambulation/Gait Ambulation/Gait Assistance: 4: Min guard Ambulation Distance (Feet): 30 Feet Assistive device: Rolling walker Ambulation/Gait Assistance Details: Ambulation limited today due to pt leaving for vascular test; however pt motivated to ambulate this pm. Cues for RW safety and R knee instability Gait Pattern: Step-through pattern;Decreased stride length    Exercises     PT Diagnosis:    PT Problem List:   PT Treatment Interventions:     PT Goals (current goals can now be found in the care plan section)    Visit Information  Last PT Received On: 11/15/13 Assistance Needed: +1 History of Present Illness: Michaela Guerra is an 67 y.o. female who reports that she awakened on Tuesday and noticed that she was having difficulty with gait.  She also noticed some difficulty with her right side specifically.  Her arm has gotten a little better but with the majority of her symptoms still present today she presented for medical evaluation.  She has not experienced paresthesias.  Acute nonhemorrhagic infarct posterior limb left internal capsule    Subjective Data      Cognition  Cognition Arousal/Alertness: Awake/alert Behavior During Therapy: WFL for tasks assessed/performed Overall Cognitive Status: Within Functional Limits for tasks assessed    Balance     End of Session PT - End of Session Equipment Utilized During Treatment: Gait belt Activity Tolerance: Patient tolerated treatment well Patient left: in bed;with nursing/sitter in room Nurse Communication: Mobility status   GP     Ernestina Columbia, SPTA 11/15/2013,  8:53 AM

## 2013-11-15 NOTE — Care Management Note (Signed)
    Page 1 of 2   11/15/2013     4:57:58 PM   CARE MANAGEMENT NOTE 11/15/2013  Patient:  Michaela Guerra, Michaela Guerra   Account Number:  [id number]  Date Initiated:  11/15/2013  Documentation initiated by:  Jiles Crocker  Subjective/Objective Assessment:   ADMITTED WITH STROKE     Action/Plan:   CM FOLLOWING FOR DCP   Anticipated DC Date:  11/22/2013   Anticipated DC Plan:  IP REHAB FACILITY      DC Planning Services  CM consult      Choice offered to / List presented to:  C-1 Patient        HH arranged  HH-2 PT  HH-3 OT      [facility] agency  [facility]   Status of service:  Completed, signed off Medicare Important Message given?  NA - LOS <3 / Initial given by admissions (If response is "NO", the following Medicare IM given date fields will be blank) Date Medicare IM given:   Date Additional Medicare IM given:    Discharge Disposition:  HOME W HOME HEALTH SERVICES  Per UR Regulation:  Reviewed for med. necessity/level of care/duration of stay  If discussed at Long Length of Stay Meetings, dates discussed:    Comments:  11/15/13 1630 Elmer Bales RN, MSN, CM- Met with patient and daughter Michaela Guerra to discuss home health needs.  [facility] has chosen [facility] for HHPT/OT.  CM spoke with Olegario Messier at [place] [facility], who accepted the referral.  Informaiton was faxed to [phone].  Patient was also requesting information on applying for Medicaid as secondary insurance.  Message was left for financial counselor to assist with this process.   12/8/2014Abelino Derrick RN,BSN,MHA [phone]

## 2013-11-15 NOTE — Progress Notes (Signed)
VASCULAR LAB PRELIMINARY  PRELIMINARY  PRELIMINARY  PRELIMINARY  Carotid Dopplers completed.    Preliminary report:  There is 1-39% ICA stenosis.  Vertebral artery flow is antegrade.  Michaela Guerra, RVT 11/15/2013, 9:10 AM

## 2013-11-15 NOTE — Progress Notes (Signed)
Seen and agreed 01/05/2013 Daje Stark Elizabeth PTA [phone] pager [phone] office    

## 2013-11-15 NOTE — Discharge Summary (Signed)
Physician Discharge Summary  SAMYRAH BRUSTER ZOX:[id number] DOB: September 10, 1946 DOA: 11/13/2013  PCP: Rene Paci, MD  Admit date: 11/13/2013 Discharge date: 11/15/2013  Time spent: 50* minutes  Recommendations for Outpatient Follow-up:  1. Follow up PCP in 2 weeks  Discharge Diagnoses:  Active Problems:   HYPERTENSION   COPD   CVA (cerebral infarction)   Hypokalemia   CVA (cerebral vascular accident)   Discharge Condition: *Stable  Diet recommendation: Low fat diet, low salt diet  Filed Weights   11/13/13 1246 11/13/13 2100  Weight: 69.854 kg (154 lb) 66.4 kg (146 lb 6.2 oz)    History of present illness:  67 y.o. female  has a past medical history of Thyroid disease; Cancer; Hypertension; COPD (chronic obstructive pulmonary disease); Stroke; and GERD (gastroesophageal reflux disease).  Presented with  Patient woke up 5 days ago with ataxia and sl;ight weakness of right arm and foot she denies any other problems associated with that at the time. She thought it would get better but it did not. She presented to ER this AM and had an MRI doe that showed new nonhemorrhagic infarct posterior limb left internal capsule. Neuro hospitalist was made aware. Hospitalist called for admission. Patient states she stopped taking all of her medications 6 months ago she ran out of her medications. She felt better off her synthroid and endocrinology told her to stay off. She has also stopped her statins and antihypertensives as well.      Hospital Course:  *CVA- patient has acute nonhemorrhagic  infarct in the posterior limb left internal capsule, patient has been off aspirin for last one year as she has a history of hemophilia and gets easy bruising with aspirin. Neurology has started the patient on aspirin 81 mg 3 times a week Monday resident Friday for secondary stroke prevention. 2-D echo and carotid Dopplers are negative.Patient was evaluated by Rehab MD who recommends to discharge the  patient home on HHPT.   Hyperlipidemia- patient started on low-dose statin.  Hypokalemia- potassium replaced.   Nonsustained V. Tach- patient had 3 beat run of NSVT, likely due to low potassium which has been replaced. Her magnesium level is 1.9. We'll continue to monitor on telemetry. Patient was on synthroid and stopped taking it . Her TSH is 18.1 Patient started on Synthroid, cardiology consulted who recommended to continue with synthroid.  Hypothyroidism- TSH is 18.1, she has not taken synthroid for three months. Will start her on Synthroid 25 mcg po daily.   Procedures: 2D Echocardiogram- The cavity size was normal. Wall thickness was normal. Systolic function was vigorous. The estimated ejection fraction was in the range of 65% to 70%. Wall motion was normal; there were no regional wall motion abnormalities. Doppler parameters are consistent with abnormal left ventricular relaxation (grade 1 diastolic dysfunction). Carotid duplex- There is 1-39% ICA stenosis. Vertebral artery flow is antegrade.     Consultations:  Neurology  Cardiology  Discharge Exam: Filed Vitals:   11/15/13 1000  BP: 148/82  Pulse: 74  Temp: 98 F (36.7 C)  Resp: 18    General: *Appear in no acute distress Cardiovascular: S1s2 RRR Respiratory: Clear bilaterally  Discharge Instructions  Discharge Orders   Future Orders Complete By Expires   Diet - low sodium heart healthy  As directed    Discharge instructions  As directed    Comments:     HH PT   Increase activity slowly  As directed        Medication List  aspirin 81 MG EC tablet  Take 1 tablet (81 mg total) by mouth 3 (three) times a week.     levothyroxine 25 MCG tablet  Commonly known as:  SYNTHROID, LEVOTHROID  Take 1 tablet (25 mcg total) by mouth daily before breakfast.     simvastatin 10 MG tablet  Commonly known as:  ZOCOR  Take 1 tablet (10 mg total) by mouth daily at 6 PM.       Allergies  Allergen  Reactions  . Codeine     REACTION: causes skin to feel like it's crawling  . Levofloxacin     REACTION: nausea      The results of significant diagnostics from this hospitalization (including imaging, microbiology, ancillary and laboratory) are listed below for reference.    Significant Diagnostic Studies: Dg Chest 2 View  11/13/2013   CLINICAL DATA:  Knee instability.  Sinus congestion.  Smoker.  EXAM: CHEST  2 VIEW  COMPARISON:  02/07/2010 and chest CT dated 01/29/2011.  FINDINGS: The heart remains normal in size. The lungs are mildly progressively hyperexpanded with mildly progressive diffuse peribronchial thickening and accentuation of the interstitial markings. Diffuse osteopenia.  IMPRESSION: Progressive changes of COPD and chronic bronchitis. No acute abnormality.   Electronically Signed   By: Gordan Payment M.D.   On: 11/13/2013 14:47   Ct Head Wo Contrast  11/13/2013   CLINICAL DATA:  Right-sided weakness since 11/08/2013.  EXAM: CT HEAD WITHOUT CONTRAST  TECHNIQUE: Contiguous axial images were obtained from the base of the skull through the vertex without intravenous contrast.  COMPARISON:  05/29/2007  FINDINGS: No mass lesion. No midline shift. No acute hemorrhage or hematoma. No extra-axial fluid collections. No evidence of acute infarction. There is fairly extensive periventricular white matter lucency consistent with chronic small vessel ischemic disease, slightly progressed since the prior study. Osseous structures are normal and unchanged.  IMPRESSION: No acute intracranial abnormality. Extensive chronic small vessel ischemic changes, slightly progressed.   Electronically Signed   By: Geanie Cooley M.D.   On: 11/13/2013 14:23   Mr Maxine Glenn Head Wo Contrast  11/13/2013   CLINICAL DATA:  Right-sided weakness. Abnormal gait. Slurred speech.  EXAM: MRI HEAD WITHOUT CONTRAST  MRA HEAD WITHOUT CONTRAST  TECHNIQUE: Multiplanar, multiecho pulse sequences of the brain and surrounding structures were  obtained without intravenous contrast. Angiographic images of the head were obtained using MRA technique without contrast.  COMPARISON:  11/13/2013 and 05/29/2007 CT.  FINDINGS: MRI HEAD FINDINGS  Acute nonhemorrhagic infarct posterior limb left internal capsule.  Prominent small vessel disease type changes.  Left calvarial 1 cm lucency may represent a hemangioma (unchanged by CT since 2008). Otherwise no evidence of intracranial mass lesion noted on this unenhanced exam.  Cerebellar tonsils minimally low lying but within the range of normal limits.  Pituitary region, pineal region and orbital structures unremarkable.  MRA HEAD FINDINGS  Anterior circulation without medium or large size vessel significant stenosis or occlusion.  Fetal type contribution to the posterior circulation bilaterally.  Ectasia of the anterior communicating artery region without saccular aneurysm noted.  Middle cerebral artery Mild branch vessel irregularity bilaterally.  Right vertebral artery is dominant. No significant stenosis of the vertebral arteries or basilar artery.  Mild irregularity of the posterior inferior cerebellar artery bilaterally.  Non visualization of the anterior inferior cerebellar artery bilaterally.  Bulbous appearance of the basilar tip from which superior cerebral arteries and posterior cerebral arteries arise without discrete saccular aneurysm.  IMPRESSION: Acute nonhemorrhagic infarct posterior  limb left internal capsule.  Prominent small vessel disease type changes.  Mild intracranial atherosclerotic type changes as detailed above.  These results were called by telephone at the time of interpretation on 11/13/2013 at 7:13 PM to Dr. Vanetta Mulders , who verbally acknowledged these results.   Electronically Signed   By: Bridgett Larsson M.D.   On: 11/13/2013 19:29   Mr Brain Wo Contrast  11/13/2013   CLINICAL DATA:  Right-sided weakness. Abnormal gait. Slurred speech.  EXAM: MRI HEAD WITHOUT CONTRAST  MRA HEAD  WITHOUT CONTRAST  TECHNIQUE: Multiplanar, multiecho pulse sequences of the brain and surrounding structures were obtained without intravenous contrast. Angiographic images of the head were obtained using MRA technique without contrast.  COMPARISON:  11/13/2013 and 05/29/2007 CT.  FINDINGS: MRI HEAD FINDINGS  Acute nonhemorrhagic infarct posterior limb left internal capsule.  Prominent small vessel disease type changes.  Left calvarial 1 cm lucency may represent a hemangioma (unchanged by CT since 2008). Otherwise no evidence of intracranial mass lesion noted on this unenhanced exam.  Cerebellar tonsils minimally low lying but within the range of normal limits.  Pituitary region, pineal region and orbital structures unremarkable.  MRA HEAD FINDINGS  Anterior circulation without medium or large size vessel significant stenosis or occlusion.  Fetal type contribution to the posterior circulation bilaterally.  Ectasia of the anterior communicating artery region without saccular aneurysm noted.  Middle cerebral artery Mild branch vessel irregularity bilaterally.  Right vertebral artery is dominant. No significant stenosis of the vertebral arteries or basilar artery.  Mild irregularity of the posterior inferior cerebellar artery bilaterally.  Non visualization of the anterior inferior cerebellar artery bilaterally.  Bulbous appearance of the basilar tip from which superior cerebral arteries and posterior cerebral arteries arise without discrete saccular aneurysm.  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  Prominent small vessel disease type changes.  Mild intracranial atherosclerotic type changes as detailed above.  These results were called by telephone at the time of interpretation on 11/13/2013 at 7:13 PM to Dr. Vanetta Mulders , who verbally acknowledged these results.   Electronically Signed   By: Bridgett Larsson M.D.   On: 11/13/2013 19:29   Dg Knee Complete 4 Views  Right  11/13/2013   CLINICAL DATA:  Right knee instability.  Previous knee surgery.  EXAM: RIGHT KNEE - COMPLETE 4+ VIEW  COMPARISON:  02/13/2009 and MR dated 04/18/2009.  FINDINGS: Mild lateral joint space narrowing. No fracture or effusion. The bones appear osteopenic.  IMPRESSION: No acute abnormality.   Electronically Signed   By: Gordan Payment M.D.   On: 11/13/2013 14:46    Microbiology: No results found for this or any previous visit (from the past 240 hour(s)).   Labs: Basic Metabolic Panel:  Recent Labs Lab 11/13/13 1334 11/14/13 0235 11/14/13 1515  NA 141  --  139  K 3.3*  --  4.0  CL 103  --  107  CO2 26  --  23  GLUCOSE 137*  --  116*  BUN 11  --  10  CREATININE 0.72  --  0.64  CALCIUM 9.7  --  9.2  MG  --  1.9  --    Liver Function Tests:  Recent Labs Lab 11/13/13 1334  AST 13  ALT 13  ALKPHOS 79  BILITOT 0.7  PROT 6.9  ALBUMIN 3.7   No results found for this basename: LIPASE, AMYLASE,  in the last 168 hours No results found for this basename: AMMONIA,  in the last 168 hours CBC:  Recent Labs Lab 11/13/13 1334  WBC 8.0  NEUTROABS 5.0  HGB 15.4*  HCT 44.9  MCV 92.4  PLT 262   Cardiac Enzymes: No results found for this basename: CKTOTAL, CKMB, CKMBINDEX, TROPONINI,  in the last 168 hours BNP: BNP (last 3 results) No results found for this basename: PROBNP,  in the last 8760 hours CBG: No results found for this basename: GLUCAP,  in the last 168 hours     Signed:  Yacine Garriga S  [facility] 11/15/2013, 4:23 PM

## 2014-01-19 ENCOUNTER — Telehealth: Payer: Self-pay | Admitting: *Deleted

## 2014-01-19 NOTE — Telephone Encounter (Signed)
Called patient and suggested that since she has never been seen in our office and has not been seen for this problem, to contact PcP(Erin Marjory Lies) and to keep her hosp. F/u appt with Dr Leonie Man, verbalized understanding.

## 2014-01-25 ENCOUNTER — Ambulatory Visit: Payer: Self-pay | Admitting: Neurology

## 2014-05-10 ENCOUNTER — Telehealth: Payer: Self-pay | Admitting: Neurology

## 2014-05-10 NOTE — Telephone Encounter (Signed)
Patient wants provider to know that she had an appointment for February that was cancelled and rescheduled for June she was very upset and states since it has taken this long to be seen our services are no longer needed and wanted the provider to know

## 2014-05-10 NOTE — Telephone Encounter (Signed)
C5 can schedule to see the patient in the next couple of weeks

## 2014-05-10 NOTE — Telephone Encounter (Signed)
Pt calling stating that she would like for Dr. Leonie Man to know that pt had an appt in February 2015 and appt was cancelled and resch for June 2015 and pt states that she is very upset and states since it took so long that pt does not need our services any longer. FYI

## 2014-05-11 NOTE — Telephone Encounter (Signed)
Called pt and left message informing pt that Dr. Leonie Man would like to schedule pt an appt in the next two weeks and if she would like to do that to give our office a call back.

## 2014-05-12 ENCOUNTER — Ambulatory Visit: Payer: Self-pay | Admitting: Neurology

## 2014-08-30 ENCOUNTER — Other Ambulatory Visit: Payer: Self-pay | Admitting: Gastroenterology

## 2014-09-01 ENCOUNTER — Other Ambulatory Visit: Payer: Self-pay | Admitting: Family Medicine

## 2014-09-01 DIAGNOSIS — Z853 Personal history of malignant neoplasm of breast: Secondary | ICD-10-CM

## 2014-09-07 ENCOUNTER — Ambulatory Visit
Admission: RE | Admit: 2014-09-07 | Discharge: 2014-09-07 | Disposition: A | Discharge status: Home / Self Care | Payer: Medicare Other | Source: Ambulatory Visit | Attending: Family Medicine | Admitting: Family Medicine

## 2014-09-07 DIAGNOSIS — Z853 Personal history of malignant neoplasm of breast: Secondary | ICD-10-CM

## 2014-10-03 ENCOUNTER — Emergency Department (HOSPITAL_COMMUNITY): Payer: Medicare Other

## 2014-10-03 ENCOUNTER — Inpatient Hospital Stay (HOSPITAL_COMMUNITY)
Admission: EM | Admit: 2014-10-03 | Discharge: 2014-10-06 | DRG: 470 | Disposition: A | Discharge status: Skilled Nursing Facility | Payer: Medicare Other | Attending: Internal Medicine | Admitting: Internal Medicine

## 2014-10-03 ENCOUNTER — Encounter (HOSPITAL_COMMUNITY): Payer: Self-pay | Admitting: Emergency Medicine

## 2014-10-03 DIAGNOSIS — E039 Hypothyroidism, unspecified: Secondary | ICD-10-CM | POA: Diagnosis present

## 2014-10-03 DIAGNOSIS — F17210 Nicotine dependence, cigarettes, uncomplicated: Secondary | ICD-10-CM | POA: Diagnosis present

## 2014-10-03 DIAGNOSIS — Z79899 Other long term (current) drug therapy: Secondary | ICD-10-CM

## 2014-10-03 DIAGNOSIS — J449 Chronic obstructive pulmonary disease, unspecified: Secondary | ICD-10-CM | POA: Diagnosis present

## 2014-10-03 DIAGNOSIS — Z885 Allergy status to narcotic agent status: Secondary | ICD-10-CM | POA: Diagnosis not present

## 2014-10-03 DIAGNOSIS — Z8673 Personal history of transient ischemic attack (TIA), and cerebral infarction without residual deficits: Secondary | ICD-10-CM

## 2014-10-03 DIAGNOSIS — F17200 Nicotine dependence, unspecified, uncomplicated: Secondary | ICD-10-CM

## 2014-10-03 DIAGNOSIS — Z7982 Long term (current) use of aspirin: Secondary | ICD-10-CM | POA: Diagnosis not present

## 2014-10-03 DIAGNOSIS — S72041A Displaced fracture of base of neck of right femur, initial encounter for closed fracture: Principal | ICD-10-CM | POA: Diagnosis present

## 2014-10-03 DIAGNOSIS — W101XXA Fall (on)(from) sidewalk curb, initial encounter: Secondary | ICD-10-CM | POA: Diagnosis present

## 2014-10-03 DIAGNOSIS — Y929 Unspecified place or not applicable: Secondary | ICD-10-CM | POA: Diagnosis not present

## 2014-10-03 DIAGNOSIS — E785 Hyperlipidemia, unspecified: Secondary | ICD-10-CM | POA: Diagnosis present

## 2014-10-03 DIAGNOSIS — Z888 Allergy status to other drugs, medicaments and biological substances status: Secondary | ICD-10-CM

## 2014-10-03 DIAGNOSIS — D72829 Elevated white blood cell count, unspecified: Secondary | ICD-10-CM | POA: Diagnosis present

## 2014-10-03 DIAGNOSIS — K219 Gastro-esophageal reflux disease without esophagitis: Secondary | ICD-10-CM | POA: Diagnosis present

## 2014-10-03 DIAGNOSIS — Z853 Personal history of malignant neoplasm of breast: Secondary | ICD-10-CM

## 2014-10-03 DIAGNOSIS — J410 Simple chronic bronchitis: Secondary | ICD-10-CM

## 2014-10-03 DIAGNOSIS — I10 Essential (primary) hypertension: Secondary | ICD-10-CM | POA: Diagnosis present

## 2014-10-03 DIAGNOSIS — S72009S Fracture of unspecified part of neck of unspecified femur, sequela: Secondary | ICD-10-CM

## 2014-10-03 DIAGNOSIS — K589 Irritable bowel syndrome without diarrhea: Secondary | ICD-10-CM | POA: Diagnosis present

## 2014-10-03 DIAGNOSIS — I639 Cerebral infarction, unspecified: Secondary | ICD-10-CM | POA: Diagnosis present

## 2014-10-03 DIAGNOSIS — S72009A Fracture of unspecified part of neck of unspecified femur, initial encounter for closed fracture: Secondary | ICD-10-CM | POA: Diagnosis present

## 2014-10-03 DIAGNOSIS — Z96649 Presence of unspecified artificial hip joint: Secondary | ICD-10-CM

## 2014-10-03 DIAGNOSIS — I6350 Cerebral infarction due to unspecified occlusion or stenosis of unspecified cerebral artery: Secondary | ICD-10-CM

## 2014-10-03 DIAGNOSIS — W19XXXA Unspecified fall, initial encounter: Secondary | ICD-10-CM | POA: Diagnosis present

## 2014-10-03 DIAGNOSIS — S72001A Fracture of unspecified part of neck of right femur, initial encounter for closed fracture: Secondary | ICD-10-CM

## 2014-10-03 DIAGNOSIS — W19XXXD Unspecified fall, subsequent encounter: Secondary | ICD-10-CM

## 2014-10-03 LAB — CBC WITH DIFFERENTIAL/PLATELET
BASOS ABS: 0 10*3/uL (ref 0.0–0.1)
Basophils Relative: 0 % (ref 0–1)
Eosinophils Absolute: 0.1 10*3/uL (ref 0.0–0.7)
Eosinophils Relative: 2 % (ref 0–5)
HEMATOCRIT: 41.5 % (ref 36.0–46.0)
Hemoglobin: 14.3 g/dL (ref 12.0–15.0)
LYMPHS PCT: 19 % (ref 12–46)
Lymphs Abs: 1.8 10*3/uL (ref 0.7–4.0)
MCH: 31.2 pg (ref 26.0–34.0)
MCHC: 34.5 g/dL (ref 30.0–36.0)
MCV: 90.4 fL (ref 78.0–100.0)
MONO ABS: 0.7 10*3/uL (ref 0.1–1.0)
Monocytes Relative: 7 % (ref 3–12)
NEUTROS ABS: 6.9 10*3/uL (ref 1.7–7.7)
Neutrophils Relative %: 72 % (ref 43–77)
PLATELETS: 233 10*3/uL (ref 150–400)
RBC: 4.59 MIL/uL (ref 3.87–5.11)
RDW: 14.1 % (ref 11.5–15.5)
WBC: 9.6 10*3/uL (ref 4.0–10.5)

## 2014-10-03 LAB — URINALYSIS, ROUTINE W REFLEX MICROSCOPIC
Bilirubin Urine: NEGATIVE
GLUCOSE, UA: NEGATIVE mg/dL
HGB URINE DIPSTICK: NEGATIVE
KETONES UR: 15 mg/dL — AB
Leukocytes, UA: NEGATIVE
Nitrite: NEGATIVE
Protein, ur: NEGATIVE mg/dL
Specific Gravity, Urine: 1.02 (ref 1.005–1.030)
Urobilinogen, UA: 0.2 mg/dL (ref 0.0–1.0)
pH: 5.5 (ref 5.0–8.0)

## 2014-10-03 LAB — COMPREHENSIVE METABOLIC PANEL
ALT: 16 U/L (ref 0–35)
AST: 18 U/L (ref 0–37)
Albumin: 3.7 g/dL (ref 3.5–5.2)
Alkaline Phosphatase: 77 U/L (ref 39–117)
Anion gap: 14 (ref 5–15)
BILIRUBIN TOTAL: 0.6 mg/dL (ref 0.3–1.2)
BUN: 14 mg/dL (ref 6–23)
CHLORIDE: 102 meq/L (ref 96–112)
CO2: 22 meq/L (ref 19–32)
Calcium: 9.4 mg/dL (ref 8.4–10.5)
Creatinine, Ser: 0.71 mg/dL (ref 0.50–1.10)
GFR calc Af Amer: >90 mL/min (ref >90)
GFR calc non Af Amer: 86 mL/min — ABNORMAL LOW (ref >90)
Glucose, Bld: 116 mg/dL — ABNORMAL HIGH (ref 70–99)
Potassium: 3.5 mEq/L — ABNORMAL LOW (ref 3.7–5.3)
SODIUM: 138 meq/L (ref 137–147)
Total Protein: 6.5 g/dL (ref 6.0–8.3)

## 2014-10-03 LAB — TYPE AND SCREEN
ABO/RH(D): O NEG
Antibody Screen: NEGATIVE

## 2014-10-03 LAB — ABO/RH: ABO/RH(D): O NEG

## 2014-10-03 LAB — GLUCOSE, CAPILLARY: Glucose-Capillary: 162 mg/dL — ABNORMAL HIGH (ref 70–99)

## 2014-10-03 MED ORDER — AMLODIPINE BESYLATE 2.5 MG PO TABS
2.5000 mg | ORAL_TABLET | Freq: Every day | ORAL | Status: DC
  Administered 2014-10-05 – 2014-10-06 (×2): 2.5 mg via ORAL
  Filled 2014-10-03 (×2): qty 1

## 2014-10-03 MED ORDER — METHOCARBAMOL 500 MG PO TABS
500.0000 mg | ORAL_TABLET | Freq: Four times a day (QID) | ORAL | Status: DC | PRN
  Administered 2014-10-03 – 2014-10-04 (×2): 500 mg via ORAL
  Filled 2014-10-03: qty 1

## 2014-10-03 MED ORDER — FENTANYL CITRATE 0.05 MG/ML IJ SOLN
50.0000 ug | Freq: Once | INTRAMUSCULAR | Status: AC
  Administered 2014-10-03: 50 ug via INTRAVENOUS
  Filled 2014-10-03: qty 2

## 2014-10-03 MED ORDER — NICOTINE 21 MG/24HR TD PT24
21.0000 mg | MEDICATED_PATCH | Freq: Every day | TRANSDERMAL | Status: DC
  Administered 2014-10-03 – 2014-10-06 (×4): 21 mg via TRANSDERMAL
  Filled 2014-10-03 (×4): qty 1

## 2014-10-03 MED ORDER — HYDROMORPHONE HCL 1 MG/ML IJ SOLN
1.0000 mg | Freq: Once | INTRAMUSCULAR | Status: DC
Start: 2014-10-03 — End: 2014-10-03

## 2014-10-03 MED ORDER — ONDANSETRON HCL 4 MG/2ML IJ SOLN
4.0000 mg | Freq: Once | INTRAMUSCULAR | Status: AC
  Administered 2014-10-03: 4 mg via INTRAVENOUS
  Filled 2014-10-03: qty 2

## 2014-10-03 MED ORDER — FENTANYL CITRATE 0.05 MG/ML IJ SOLN
50.0000 ug | INTRAMUSCULAR | Status: DC | PRN
  Administered 2014-10-03 (×2): 50 ug via INTRAVENOUS
  Filled 2014-10-03 (×2): qty 2

## 2014-10-03 MED ORDER — SIMVASTATIN 5 MG PO TABS
10.0000 mg | ORAL_TABLET | Freq: Every day | ORAL | Status: DC
  Administered 2014-10-05: 10 mg via ORAL
  Filled 2014-10-03: qty 2

## 2014-10-03 MED ORDER — HYDROMORPHONE HCL 1 MG/ML IJ SOLN
1.0000 mg | Freq: Once | INTRAMUSCULAR | Status: AC
  Administered 2014-10-03: 1 mg via INTRAVENOUS
  Filled 2014-10-03: qty 1

## 2014-10-03 MED ORDER — DEXTROSE 5 % IV SOLN
500.0000 mg | Freq: Four times a day (QID) | INTRAVENOUS | Status: DC | PRN
  Filled 2014-10-03: qty 5

## 2014-10-03 MED ORDER — LORAZEPAM 2 MG/ML IJ SOLN
0.5000 mg | Freq: Once | INTRAMUSCULAR | Status: AC
  Administered 2014-10-03: 0.5 mg via INTRAVENOUS
  Filled 2014-10-03: qty 1

## 2014-10-03 MED ORDER — LEVOTHYROXINE SODIUM 25 MCG PO TABS
25.0000 ug | ORAL_TABLET | Freq: Every day | ORAL | Status: DC
  Administered 2014-10-05 – 2014-10-06 (×2): 25 ug via ORAL
  Filled 2014-10-03 (×2): qty 1

## 2014-10-03 MED ORDER — HYDROMORPHONE HCL 1 MG/ML IJ SOLN
1.0000 mg | INTRAMUSCULAR | Status: DC | PRN
  Administered 2014-10-03 – 2014-10-04 (×4): 1 mg via INTRAVENOUS
  Filled 2014-10-03 (×4): qty 1

## 2014-10-03 NOTE — Progress Notes (Signed)
Pt arrived to floor via stretcher. Pt c/o of severe pain to the right hip and leg. Orientated pt to room and equipment. Will continue to monitor.

## 2014-10-03 NOTE — ED Notes (Signed)
Clothing removed, pants cut per patient request.  Daughter at bedside, all patient belongings including purse given to daughter Almyra Free

## 2014-10-03 NOTE — ED Notes (Signed)
Per EMS: pt was home assisting daughter to move some boxes when she stepped off the curb and fell backwards on to her bottom.  Pt reporting pain to right hip and leg.  Shortening and rotation noted to extremity.  Pulses intact.  EMS placed pelvic binder in place.  Pt given 150 mcg of Fentanyl PTA; pain currently 8/10.

## 2014-10-03 NOTE — ED Notes (Signed)
Pt undressed, in gown, on monitor, continuous pulse oximetry and blood pressure cuff 

## 2014-10-03 NOTE — ED Provider Notes (Signed)
68 year old female had a mechanical fall she was going to drink out of her car landing on her right hip and right wrist. Her right leg is shortened and actually rotated consistent with hip fracture. Right wrist appears normal with no significant swelling or liver is moderate tenderness. X-rays confirms femoral neck fracture on the right. Normal right wrist x-rays. She will need to be admitted for surgical management of her hip fracture.  I saw and evaluated the patient, reviewed the resident's note and I agree with the findings and plan.   EKG Interpretation   Date/Time:  Monday October 03 2014 17:10:05 EDT Ventricular Rate:  70 PR Interval:  195 QRS Duration: 90 QT Interval:  411 QTC Calculation: 443 R Axis:   -82 Text Interpretation:  Age not entered, assumed to be  68 years old for  purpose of ECG interpretation Sinus rhythm LAD, consider left anterior  fascicular block Borderline T abnormalities, anterior leads When compared  with ECG of 11/14/2013, No significant change was found Confirmed by [facility]   MD, Kaina Orengo ([id number]) on 10/03/2014 6:59:18 PM        Delora Fuel, MD 01/65/53 [id number]

## 2014-10-03 NOTE — Consult Note (Signed)
NAME: Michaela Guerra MRN:   [id number] DOB:   1946-10-22     HISTORY AND PHYSICAL  CHIEF COMPLAINT:  Right hip pain  HISTORY:   Michaela Benedict Morrisis a 68 y.o. female  With a h/o cva, copd, htn comes in after tripping off the side of a curb and hurting her right hip. No loc. No recent illnesses. She did have a cva in 2014 (which she does not believe was an actual stroke, but mri showed an infarct) with no residual demage and takes a baby aspirin a day for that. She lives alone and has no problems with her daily activities.  PAST MEDICAL HISTORY:   Past Medical History  Diagnosis Date  . Hypothyroidism   . Breast cancer     Adenocarcinoma  . Hypertension   . COPD (chronic obstructive pulmonary disease)   . Stroke 11/2013  . GERD (gastroesophageal reflux disease)     PAST SURGICAL HISTORY:   Past Surgical History  Procedure Laterality Date  . Breast surgery      MEDICATIONS:   Medications Prior to Admission  Medication Sig Dispense Refill  . amLODipine (NORVASC) 2.5 MG tablet Take 2.5 mg by mouth daily.      Marland Kitchen aspirin 81 MG EC tablet Take 81 mg by mouth daily.      Marland Kitchen levothyroxine (SYNTHROID, LEVOTHROID) 25 MCG tablet Take 1 tablet (25 mcg total) by mouth daily before breakfast.  30 tablet  2  . Pitavastatin Calcium (LIVALO) 1 MG TABS Take 1 tablet by mouth daily.        ALLERGIES:   Allergies  Allergen Reactions  . Codeine     REACTION: causes skin to feel like it's crawling  . Levofloxacin     REACTION: nausea    REVIEW OF SYSTEMS:   Negative except HPI  FAMILY HISTORY:   Family History  Problem Relation Age of Onset  . Breast cancer Mother   . Stroke Mother   . Stroke Father   . Breast cancer Sister   . Hypertension Sister   . Hypertension Sister   . Dementia Sister     SOCIAL HISTORY:   reports that she has been smoking.  She does not have any smokeless tobacco history on file. She reports that she drinks alcohol. She reports that she does not use illicit  drugs.  PHYSICAL EXAM:  General appearance: alert, cooperative and no distress Resp: clear to auscultation bilaterally Cardio: regular rate and rhythm, S1, S2 normal, no murmur, click, rub or gallop GI: soft, non-tender; bowel sounds normal; no masses,  no organomegaly Extremities: extremities normal, atraumatic, no cyanosis or edema and Homans sign is negative, no sign of DVT Pulses: 2+ and symmetric Skin: Skin color, texture, turgor normal. No rashes or lesions Neurologic: Alert and oriented X 3, normal strength and tone. Normal symmetric reflexes. Normal coordination and gait    LABORATORY STUDIES:  Recent Labs  10/03/14 1713  WBC 9.6  HGB 14.3  HCT 41.5  PLT 233     Recent Labs  10/03/14 1713  NA 138  K 3.5*  CL 102  CO2 22  GLUCOSE 116*  BUN 14  CREATININE 0.71  CALCIUM 9.4    STUDIES/RESULTS:  Dg Chest 1 View  10/03/2014   CLINICAL DATA:  Fall at home.  EXAM: CHEST - 1 VIEW  COMPARISON:  None.  FINDINGS: The heart size and mediastinal contours are within normal limits. Both lungs are clear. The visualized skeletal structures are unremarkable.  IMPRESSION: No acute cardiopulmonary abnormality seen.   Electronically Signed   By: Sabino Dick M.D.   On: 10/03/2014 18:10   Dg Wrist Complete Right  10/03/2014   CLINICAL DATA:  Fall.  Radial sided wrist pain.  Initial encounter  EXAM: RIGHT WRIST - COMPLETE 3+ VIEW  COMPARISON:  None.  FINDINGS: There is no evidence of fracture or dislocation.  There is mild degenerative narrowing of the radioscaphoid joint. Moderate degenerative spurring to the first [facility] joint.  IMPRESSION: No acute osseous findings.   Electronically Signed   By: Jorje Guild M.D.   On: 10/03/2014 18:08   Dg Hip Complete Right  10/03/2014   CLINICAL DATA:  Fall with right hip pain.  Initial encounter  EXAM: RIGHT HIP - COMPLETE 2+ VIEW  COMPARISON:  None.  FINDINGS: There is an acute fracture of the right femoral neck, basicervical. No  intertrochanteric component is identified. There is mild impaction and varus angulation. The femoral head remains located.  No evidence of pelvic ring fracture or diastasis. The left hip is located and intact.  IMPRESSION: Displaced right femoral neck fracture.   Electronically Signed   By: Jorje Guild M.D.   On: 10/03/2014 18:09   Mm Diag Breast Tomo Bilateral  09/07/2014   CLINICAL DATA:  68 year old female with history of left breast cancer post lumpectomy in 2007 followed by radiation therapy.  EXAM: DIGITAL DIAGNOSTIC BILATERAL MAMMOGRAM WITH 3D TOMOSYNTHESIS AND CAD  COMPARISON:  Previous mammograms.  ACR Breast Density Category b: There are scattered areas of fibroglandular density.  FINDINGS: No suspicious masses or calcifications are seen in either breast. Postlumpectomy changes are present in the upper slightly inner left breast. A spot compression magnification tangential view of the lumpectomy site in the left breast was performed. There is no mammographic evidence of locally recurrent malignancy.  Mammographic images were processed with CAD.  IMPRESSION: No mammographic evidence of malignancy in either breast.  RECOMMENDATION: Screening mammogram in one year.(Code:SM-B-01Y)  I have discussed the findings and recommendations with the patient. Results were also provided in writing at the conclusion of the visit. If applicable, a reminder letter will be sent to the patient regarding the next appointment.  BI-RADS CATEGORY  2: Benign.   Electronically Signed   By: Everlean Alstrom M.D.   On: 09/07/2014 14:23    ASSESSMENT:  Right hip fracture         Principal Problem:   Hip fracture Active Problems:   Hypothyroidism   Essential hypertension   IBS   h/o CVA   COPD (chronic obstructive pulmonary disease)    PLAN:  Right Hemiarthroplasty tomorrow   [name] 10/03/2014. 9:12 PM

## 2014-10-03 NOTE — H&P (Signed)
PCP:   London Pepper, MD   Chief Complaint:  Right hip pain  HPI: 68 yo female h/o cva, copd, htn comes in after tripping off the side of a curb and hurting her right hip.  No loc.  No recent illnesses.  She did have a cva in 2014 (which she does not believe was an actual stroke, but mri showed an infarct) with no residual demage and takes a baby aspirin a day for that.  She lives alone and has no problems with her daily activities.  No previous CAD.    Review of Systems:  Positive and negative as per HPI otherwise all other systems are negative  Past Medical History: Past Medical History  Diagnosis Date  . Hypothyroidism   . Breast cancer     Adenocarcinoma  . Hypertension   . COPD (chronic obstructive pulmonary disease)   . Stroke 11/2013  . GERD (gastroesophageal reflux disease)    Past Surgical History  Procedure Laterality Date  . Breast surgery      Medications: Prior to Admission medications   Medication Sig Start Date End Date Taking? Authorizing Provider  amLODipine (NORVASC) 2.5 MG tablet Take 2.5 mg by mouth daily.   Yes Historical Provider, MD  aspirin 81 MG EC tablet Take 81 mg by mouth daily. 11/15/13  Yes Oswald Hillock, MD  levothyroxine (SYNTHROID, LEVOTHROID) 25 MCG tablet Take 1 tablet (25 mcg total) by mouth daily before breakfast. 11/15/13  Yes Oswald Hillock, MD  Pitavastatin Calcium (LIVALO) 1 MG TABS Take 1 tablet by mouth daily.   Yes Historical Provider, MD    Allergies:   Allergies  Allergen Reactions  . Codeine     REACTION: causes skin to feel like it's crawling  . Levofloxacin     REACTION: nausea    Social History:  reports that she has been smoking.  She does not have any smokeless tobacco history on file. She reports that she drinks alcohol. She reports that she does not use illicit drugs.  Family History: Family History  Problem Relation Age of Onset  . Breast cancer Mother   . Stroke Mother   . Stroke Father   . Breast cancer  Sister   . Hypertension Sister   . Hypertension Sister   . Dementia Sister     Physical Exam: Filed Vitals:   10/03/14 1830 10/03/14 1900 10/03/14 1930 10/03/14 2000  BP: 156/73 139/80 121/65 136/79  Pulse: 75 78 71 83  Temp:      TempSrc:      Resp:      SpO2: 93% 93% 90% 91%   General appearance: alert, cooperative and no distress Head: Normocephalic, without obvious abnormality, atraumatic Eyes: negative Nose: Nares normal. Septum midline. Mucosa normal. No drainage or sinus tenderness. Neck: no JVD and supple, symmetrical, trachea midline Lungs: clear to auscultation bilaterally Heart: regular rate and rhythm, S1, S2 normal, no murmur, click, rub or gallop Abdomen: soft, non-tender; bowel sounds normal; no masses,  no organomegaly Extremities: extremities normal, atraumatic, no cyanosis or edema Pulses: 2+ and symmetric Skin: Skin color, texture, turgor normal. No rashes or lesions Neurologic: Grossly normal   Labs on Admission:   Recent Labs  10/03/14 1713  NA 138  K 3.5*  CL 102  CO2 22  GLUCOSE 116*  BUN 14  CREATININE 0.71  CALCIUM 9.4    Recent Labs  10/03/14 1713  AST 18  ALT 16  ALKPHOS 77  BILITOT 0.6  PROT  6.5  ALBUMIN 3.7    Recent Labs  10/03/14 1713  WBC 9.6  NEUTROABS 6.9  HGB 14.3  HCT 41.5  MCV 90.4  PLT 233    Radiological Exams on Admission: Dg Chest 1 View  10/03/2014   CLINICAL DATA:  Fall at home.  EXAM: CHEST - 1 VIEW  COMPARISON:  None.  FINDINGS: The heart size and mediastinal contours are within normal limits. Both lungs are clear. The visualized skeletal structures are unremarkable.  IMPRESSION: No acute cardiopulmonary abnormality seen.   Electronically Signed   By: Sabino Dick M.D.   On: 10/03/2014 18:10   Dg Wrist Complete Right  10/03/2014   CLINICAL DATA:  Fall.  Radial sided wrist pain.  Initial encounter  EXAM: RIGHT WRIST - COMPLETE 3+ VIEW  COMPARISON:  None.  FINDINGS: There is no evidence of fracture or  dislocation.  There is mild degenerative narrowing of the radioscaphoid joint. Moderate degenerative spurring to the first [facility] joint.  IMPRESSION: No acute osseous findings.   Electronically Signed   By: Jorje Guild M.D.   On: 10/03/2014 18:08   Dg Hip Complete Right  10/03/2014   CLINICAL DATA:  Fall with right hip pain.  Initial encounter  EXAM: RIGHT HIP - COMPLETE 2+ VIEW  COMPARISON:  None.  FINDINGS: There is an acute fracture of the right femoral neck, basicervical. No intertrochanteric component is identified. There is mild impaction and varus angulation. The femoral head remains located.  No evidence of pelvic ring fracture or diastasis. The left hip is located and intact.  IMPRESSION: Displaced right femoral neck fracture.   Electronically Signed   By: Jorje Guild M.D.   On: 10/03/2014 18:09    Assessment/Plan  68 yo female h/o CVA/copd/htn with right hip fracture from mechanical fall  Principal Problem:   Hip fracture-  Keep npo after midnight.  Intermediate risk due to h/o cva.  Hold her aspirin at this time.  Repeat ekg in am and if no significant issues, hopefully can go to OR tomorrow.  Hold anticoagulants also until OR time established.  Ortho called and aware to see patient for consultation.  Cardiac echo done last year for her stroke w/u was normal.    Active Problems:  Stable unless o/w noted   Hypothyroidism   Essential hypertension   IBS   h/o CVA-  Hold aspirin   COPD (chronic obstructive pulmonary disease)-  stable  She wishes to be full code.  Admit to med surg floor.  Rachael Ferrie A 10/03/2014, 8:24 PM

## 2014-10-03 NOTE — ED Provider Notes (Signed)
CSN: [id number]     Arrival date & time 10/03/14  1552 History   First MD Initiated Contact with Patient 10/03/14 1614     Chief Complaint  Patient presents with  . Fall  . Hip Pain    Patient is a 68 y.o. female presenting with hip pain. The history is provided by the patient and a relative.  Hip Pain This is a new problem. The current episode started today. The problem occurs constantly. The problem has been unchanged. Pertinent negatives include no abdominal pain, chest pain, diaphoresis, headaches, neck pain, numbness, rash, vertigo, vomiting or weakness. Nothing aggravates the symptoms. She has tried nothing for the symptoms. The treatment provided no relief.    Past Medical History  Diagnosis Date  . Hypothyroidism   . Breast cancer     Adenocarcinoma  . Hypertension   . COPD (chronic obstructive pulmonary disease)   . Stroke 11/2013  . GERD (gastroesophageal reflux disease)    Past Surgical History  Procedure Laterality Date  . Breast surgery     Family History  Problem Relation Age of Onset  . Breast cancer Mother   . Stroke Mother   . Stroke Father   . Breast cancer Sister   . Hypertension Sister   . Hypertension Sister   . Dementia Sister    History  Substance Use Topics  . Smoking status: Current Every Day Smoker -- 1.00 packs/day for 40 years  . Smokeless tobacco: Not on file     Comment: Has cut back to 1 ppd  . Alcohol Use: Yes     Comment: once every two weeks   OB History   Grav Para Term Preterm Abortions TAB SAB Ect Mult Living                 Review of Systems  Constitutional: Negative for diaphoresis.  Respiratory: Negative for shortness of breath.   Cardiovascular: Negative for chest pain.  Gastrointestinal: Negative for vomiting and abdominal pain.  Musculoskeletal: Negative for neck pain.  Skin: Negative for rash and wound.  Neurological: Negative for vertigo, syncope, weakness, light-headedness, numbness and headaches.  All other  systems reviewed and are negative.     Allergies  Codeine and Levofloxacin  Home Medications   Prior to Admission medications   Medication Sig Start Date End Date Taking? Authorizing Provider  aspirin EC 81 MG EC tablet Take 1 tablet (81 mg total) by mouth 3 (three) times a week. 11/15/13   Oswald Hillock, MD  levothyroxine (SYNTHROID, LEVOTHROID) 25 MCG tablet Take 1 tablet (25 mcg total) by mouth daily before breakfast. 11/15/13   Oswald Hillock, MD  simvastatin (ZOCOR) 10 MG tablet Take 1 tablet (10 mg total) by mouth daily at 6 PM. 11/15/13   Oswald Hillock, MD   BP 142/84  Pulse 81  Temp(Src) 98.7 F (37.1 C) (Oral)  Resp 16  SpO2 94%  Physical Exam  Constitutional: She is oriented to person, place, and time. She appears well-developed and well-nourished. She is cooperative. No distress.  HENT:  Head: Normocephalic and atraumatic.  Right Ear: External ear normal.  Left Ear: External ear normal.  Neck: Normal range of motion and phonation normal.  Cardiovascular: Normal rate and regular rhythm.   Pulmonary/Chest: Effort normal and breath sounds normal. No respiratory distress. She has no wheezes. She has no rales.  Abdominal: Soft. She exhibits no distension. There is no tenderness. There is no rebound and no guarding.  Musculoskeletal:  Right hip: She exhibits bony tenderness.  RLE shortened and externally rotated; 2+ DP and PT pulses bilaterally; sensation to light touch intact in RLE  Neurological: She is alert and oriented to person, place, and time.  Skin: Skin is warm and dry. No rash noted. She is not diaphoretic.    ED Course  Procedures  Labs Review Results for orders placed during the hospital encounter of 10/03/14  CBC WITH DIFFERENTIAL      Result Value Ref Range   WBC 9.6  4.0 - 10.5 K/uL   RBC 4.59  3.87 - 5.11 MIL/uL   Hemoglobin 14.3  12.0 - 15.0 g/dL   HCT 41.5  36.0 - 46.0 %   MCV 90.4  78.0 - 100.0 fL   MCH 31.2  26.0 - 34.0 pg   MCHC 34.5  30.0  - 36.0 g/dL   RDW 14.1  11.5 - 15.5 %   Platelets 233  150 - 400 K/uL   Neutrophils Relative % 72  43 - 77 %   Neutro Abs 6.9  1.7 - 7.7 K/uL   Lymphocytes Relative 19  12 - 46 %   Lymphs Abs 1.8  0.7 - 4.0 K/uL   Monocytes Relative 7  3 - 12 %   Monocytes Absolute 0.7  0.1 - 1.0 K/uL   Eosinophils Relative 2  0 - 5 %   Eosinophils Absolute 0.1  0.0 - 0.7 K/uL   Basophils Relative 0  0 - 1 %   Basophils Absolute 0.0  0.0 - 0.1 K/uL  COMPREHENSIVE METABOLIC PANEL      Result Value Ref Range   Sodium 138  137 - 147 mEq/L   Potassium 3.5 (*) 3.7 - 5.3 mEq/L   Chloride 102  96 - 112 mEq/L   CO2 22  19 - 32 mEq/L   Glucose, Bld 116 (*) 70 - 99 mg/dL   BUN 14  6 - 23 mg/dL   Creatinine, Ser 0.71  0.50 - 1.10 mg/dL   Calcium 9.4  8.4 - 10.5 mg/dL   Total Protein 6.5  6.0 - 8.3 g/dL   Albumin 3.7  3.5 - 5.2 g/dL   AST 18  0 - 37 U/L   ALT 16  0 - 35 U/L   Alkaline Phosphatase 77  39 - 117 U/L   Total Bilirubin 0.6  0.3 - 1.2 mg/dL   GFR calc non Af Amer 86 (*) >90 mL/min   GFR calc Af Amer >90  >90 mL/min   Anion gap 14  5 - 15  TYPE AND SCREEN      Result Value Ref Range   ABO/RH(D) O NEG     Antibody Screen NEG     Sample Expiration 10/06/2014       Imaging Review Dg Chest 1 View  10/03/2014   CLINICAL DATA:  Fall at home.  EXAM: CHEST - 1 VIEW  COMPARISON:  None.  FINDINGS: The heart size and mediastinal contours are within normal limits. Both lungs are clear. The visualized skeletal structures are unremarkable.  IMPRESSION: No acute cardiopulmonary abnormality seen.   Electronically Signed   By: Sabino Dick M.D.   On: 10/03/2014 18:10   Dg Wrist Complete Right  10/03/2014   CLINICAL DATA:  Fall.  Radial sided wrist pain.  Initial encounter  EXAM: RIGHT WRIST - COMPLETE 3+ VIEW  COMPARISON:  None.  FINDINGS: There is no evidence of fracture or dislocation.  There is mild degenerative narrowing  of the radioscaphoid joint. Moderate degenerative spurring to the first [facility]  joint.  IMPRESSION: No acute osseous findings.   Electronically Signed   By: Jorje Guild M.D.   On: 10/03/2014 18:08   Dg Hip Complete Right  10/03/2014   CLINICAL DATA:  Fall with right hip pain.  Initial encounter  EXAM: RIGHT HIP - COMPLETE 2+ VIEW  COMPARISON:  None.  FINDINGS: There is an acute fracture of the right femoral neck, basicervical. No intertrochanteric component is identified. There is mild impaction and varus angulation. The femoral head remains located.  No evidence of pelvic ring fracture or diastasis. The left hip is located and intact.  IMPRESSION: Displaced right femoral neck fracture.   Electronically Signed   By: Jorje Guild M.D.   On: 10/03/2014 18:09     EKG Interpretation   Date/Time:  Monday October 03 2014 17:10:05 EDT Ventricular Rate:  70 PR Interval:  195 QRS Duration: 90 QT Interval:  411 QTC Calculation: 443 R Axis:   -82 Text Interpretation:  Age not entered, assumed to be  68 years old for  purpose of ECG interpretation Sinus rhythm LAD, consider left anterior  fascicular block Borderline T abnormalities, anterior leads When compared  with ECG of 11/14/2013, No significant change was found Confirmed by [facility]   MD, DAVID ([id number]) on 10/03/2014 6:59:18 PM      MDM   Final diagnoses:  Fall  Closed right hip fracture, initial encounter    68 y.o. female with a history of Breast CA (adenocarcinoma), HTN, COPD, GERD, CVA. Presents after a mechanical fall where she landed on her right hip and put her right wrist out to stop herself.   Remainder of HPI, ROS, and Exam as above. Concern for hip fracture, wrist fracture. No concern for syncope - straight forward mechanical fall. No head trauma or neck trauma. Benign exam with the exception of her right hip and right wrist.   X-rays notable for right femoral neck fracture. Wrist negative. CXR unremarkable.   She was given IV pain control. The hospitalist was contacted for admission. Orthopedics  consulted. They stated they would post her for operative fixation for tomorrow mid-day.   Her hemodynamics remained acceptable in the ED. She was admitted to medicine.   This case was managed in conjunction with my attending, Dr. Roxanne Mins.     Berenice Primas, MD 10/04/14 [phone]

## 2014-10-04 ENCOUNTER — Inpatient Hospital Stay (HOSPITAL_COMMUNITY): Payer: Medicare Other | Admitting: Certified Registered"

## 2014-10-04 ENCOUNTER — Inpatient Hospital Stay (HOSPITAL_COMMUNITY): Payer: Medicare Other

## 2014-10-04 ENCOUNTER — Encounter (HOSPITAL_COMMUNITY): Payer: Medicare Other | Admitting: Certified Registered"

## 2014-10-04 ENCOUNTER — Encounter (HOSPITAL_COMMUNITY)
Admission: EM | Disposition: A | Discharge status: Skilled Nursing Facility | Payer: Medicare Other | Source: Home / Self Care | Attending: Internal Medicine

## 2014-10-04 DIAGNOSIS — Z720 Tobacco use: Secondary | ICD-10-CM

## 2014-10-04 DIAGNOSIS — I639 Cerebral infarction, unspecified: Secondary | ICD-10-CM

## 2014-10-04 DIAGNOSIS — S72009S Fracture of unspecified part of neck of unspecified femur, sequela: Secondary | ICD-10-CM

## 2014-10-04 DIAGNOSIS — J410 Simple chronic bronchitis: Secondary | ICD-10-CM

## 2014-10-04 HISTORY — PX: TOTAL HIP ARTHROPLASTY: SHX124

## 2014-10-04 LAB — BASIC METABOLIC PANEL
Anion gap: 17 — ABNORMAL HIGH (ref 5–15)
BUN: 14 mg/dL (ref 6–23)
CALCIUM: 9.2 mg/dL (ref 8.4–10.5)
CO2: 19 meq/L (ref 19–32)
CREATININE: 0.72 mg/dL (ref 0.50–1.10)
Chloride: 101 mEq/L (ref 96–112)
GFR calc Af Amer: >90 mL/min (ref >90)
GFR, EST NON AFRICAN AMERICAN: 86 mL/min — AB (ref >90)
GLUCOSE: 135 mg/dL — AB (ref 70–99)
Potassium: 3.7 mEq/L (ref 3.7–5.3)
Sodium: 137 mEq/L (ref 137–147)

## 2014-10-04 LAB — SURGICAL PCR SCREEN
MRSA, PCR: NEGATIVE
Staphylococcus aureus: NEGATIVE

## 2014-10-04 LAB — CBC
HEMATOCRIT: 41.7 % (ref 36.0–46.0)
HEMOGLOBIN: 14 g/dL (ref 12.0–15.0)
MCH: 31.3 pg (ref 26.0–34.0)
MCHC: 33.6 g/dL (ref 30.0–36.0)
MCV: 93.3 fL (ref 78.0–100.0)
Platelets: 226 10*3/uL (ref 150–400)
RBC: 4.47 MIL/uL (ref 3.87–5.11)
RDW: 14.3 % (ref 11.5–15.5)
WBC: 10.2 10*3/uL (ref 4.0–10.5)

## 2014-10-04 SURGERY — ARTHROPLASTY, HIP, TOTAL, ANTERIOR APPROACH
Anesthesia: General | Site: Hip | Laterality: Right

## 2014-10-04 MED ORDER — ROCURONIUM BROMIDE 50 MG/5ML IV SOLN
INTRAVENOUS | Status: AC
  Filled 2014-10-04: qty 1

## 2014-10-04 MED ORDER — SUCCINYLCHOLINE CHLORIDE 20 MG/ML IJ SOLN
INTRAMUSCULAR | Status: AC
  Filled 2014-10-04: qty 1

## 2014-10-04 MED ORDER — PHENYLEPHRINE HCL 10 MG/ML IJ SOLN
INTRAMUSCULAR | Status: DC | PRN
  Administered 2014-10-04: 40 ug via INTRAVENOUS
  Administered 2014-10-04 (×2): 80 ug via INTRAVENOUS

## 2014-10-04 MED ORDER — POLYETHYLENE GLYCOL 3350 17 G PO PACK: 17.0000 g | PACK | Freq: Every day | ORAL | Status: DC | PRN

## 2014-10-04 MED ORDER — METOCLOPRAMIDE HCL 5 MG/ML IJ SOLN: 5.0000 mg | Freq: Three times a day (TID) | INTRAMUSCULAR | Status: DC | PRN

## 2014-10-04 MED ORDER — NEOSTIGMINE METHYLSULFATE 10 MG/10ML IV SOLN
INTRAVENOUS | Status: AC
  Filled 2014-10-04: qty 1

## 2014-10-04 MED ORDER — CEFAZOLIN SODIUM-DEXTROSE 2-3 GM-% IV SOLR
2.0000 g | Freq: Four times a day (QID) | INTRAVENOUS | Status: AC
  Administered 2014-10-04 – 2014-10-05 (×2): 2 g via INTRAVENOUS
  Filled 2014-10-04 (×2): qty 50

## 2014-10-04 MED ORDER — ONDANSETRON HCL 4 MG PO TABS: 4.0000 mg | ORAL_TABLET | Freq: Four times a day (QID) | ORAL | Status: DC | PRN

## 2014-10-04 MED ORDER — OXYCODONE HCL 5 MG PO TABS
ORAL_TABLET | ORAL | Status: AC
  Filled 2014-10-04: qty 1

## 2014-10-04 MED ORDER — ARTIFICIAL TEARS OP OINT
TOPICAL_OINTMENT | OPHTHALMIC | Status: AC
  Filled 2014-10-04: qty 3.5

## 2014-10-04 MED ORDER — METHOCARBAMOL 1000 MG/10ML IJ SOLN
500.0000 mg | Freq: Four times a day (QID) | INTRAVENOUS | Status: DC | PRN
  Filled 2014-10-04: qty 5

## 2014-10-04 MED ORDER — FENTANYL CITRATE 0.05 MG/ML IJ SOLN
INTRAMUSCULAR | Status: DC | PRN
  Administered 2014-10-04: 100 ug via INTRAVENOUS
  Administered 2014-10-04: 50 ug via INTRAVENOUS

## 2014-10-04 MED ORDER — GLYCOPYRROLATE 0.2 MG/ML IJ SOLN
INTRAMUSCULAR | Status: DC | PRN
  Administered 2014-10-04: .7 mg via INTRAVENOUS

## 2014-10-04 MED ORDER — LIDOCAINE HCL (CARDIAC) 20 MG/ML IV SOLN
INTRAVENOUS | Status: DC | PRN
  Administered 2014-10-04: 100 mg via INTRAVENOUS

## 2014-10-04 MED ORDER — DIPHENHYDRAMINE HCL 25 MG PO CAPS: 25.0000 mg | ORAL_CAPSULE | ORAL | Status: DC | PRN

## 2014-10-04 MED ORDER — ONDANSETRON HCL 4 MG/2ML IJ SOLN: 4.0000 mg | Freq: Four times a day (QID) | INTRAMUSCULAR | Status: DC | PRN

## 2014-10-04 MED ORDER — LIDOCAINE HCL (CARDIAC) 20 MG/ML IV SOLN
INTRAVENOUS | Status: AC
  Filled 2014-10-04: qty 5

## 2014-10-04 MED ORDER — ALBUTEROL SULFATE HFA 108 (90 BASE) MCG/ACT IN AERS
INHALATION_SPRAY | RESPIRATORY_TRACT | Status: DC | PRN
  Administered 2014-10-04: 2 via RESPIRATORY_TRACT

## 2014-10-04 MED ORDER — LACTATED RINGERS IV SOLN
INTRAVENOUS | Status: DC | PRN
  Administered 2014-10-04 (×2): via INTRAVENOUS

## 2014-10-04 MED ORDER — METOPROLOL TARTRATE 1 MG/ML IV SOLN
5.0000 mg | INTRAVENOUS | Status: DC | PRN
  Filled 2014-10-04: qty 5

## 2014-10-04 MED ORDER — CEFAZOLIN SODIUM-DEXTROSE 2-3 GM-% IV SOLR
INTRAVENOUS | Status: DC | PRN
  Administered 2014-10-04: 2 g via INTRAVENOUS

## 2014-10-04 MED ORDER — ONDANSETRON HCL 4 MG/2ML IJ SOLN
INTRAMUSCULAR | Status: AC
  Filled 2014-10-04: qty 2

## 2014-10-04 MED ORDER — HYDROMORPHONE HCL 1 MG/ML IJ SOLN
1.0000 mg | INTRAMUSCULAR | Status: DC | PRN
  Administered 2014-10-04: 1 mg via INTRAVENOUS
  Filled 2014-10-04: qty 1

## 2014-10-04 MED ORDER — PHENYLEPHRINE 40 MCG/ML (10ML) SYRINGE FOR IV PUSH (FOR BLOOD PRESSURE SUPPORT)
PREFILLED_SYRINGE | INTRAVENOUS | Status: AC
  Filled 2014-10-04: qty 10

## 2014-10-04 MED ORDER — ZOLPIDEM TARTRATE 5 MG PO TABS: 5.0000 mg | ORAL_TABLET | Freq: Every evening | ORAL | Status: DC | PRN

## 2014-10-04 MED ORDER — ARTIFICIAL TEARS OP OINT
TOPICAL_OINTMENT | OPHTHALMIC | Status: DC | PRN
  Administered 2014-10-04: 1 via OPHTHALMIC

## 2014-10-04 MED ORDER — SODIUM CHLORIDE 0.9 % IV SOLN: INTRAVENOUS | Status: DC

## 2014-10-04 MED ORDER — HYDROCODONE-ACETAMINOPHEN 5-325 MG PO TABS
1.0000 | ORAL_TABLET | Freq: Four times a day (QID) | ORAL | Status: DC | PRN
  Administered 2014-10-04: 1 via ORAL
  Administered 2014-10-05 – 2014-10-06 (×4): 2 via ORAL
  Filled 2014-10-04 (×2): qty 2
  Filled 2014-10-04: qty 1
  Filled 2014-10-04 (×2): qty 2

## 2014-10-04 MED ORDER — EPHEDRINE SULFATE 50 MG/ML IJ SOLN
INTRAMUSCULAR | Status: DC | PRN
  Administered 2014-10-04: 10 mg via INTRAVENOUS

## 2014-10-04 MED ORDER — EPHEDRINE SULFATE 50 MG/ML IJ SOLN
INTRAMUSCULAR | Status: AC
  Filled 2014-10-04: qty 1

## 2014-10-04 MED ORDER — MIDAZOLAM HCL 5 MG/5ML IJ SOLN
INTRAMUSCULAR | Status: DC | PRN
  Administered 2014-10-04: 2 mg via INTRAVENOUS

## 2014-10-04 MED ORDER — ALUM & MAG HYDROXIDE-SIMETH 200-200-20 MG/5ML PO SUSP: 30.0000 mL | ORAL | Status: DC | PRN

## 2014-10-04 MED ORDER — GLYCOPYRROLATE 0.2 MG/ML IJ SOLN
INTRAMUSCULAR | Status: AC
  Filled 2014-10-04: qty 3

## 2014-10-04 MED ORDER — METHOCARBAMOL 500 MG PO TABS: 500.0000 mg | ORAL_TABLET | Freq: Four times a day (QID) | ORAL | Status: DC | PRN

## 2014-10-04 MED ORDER — CHLORHEXIDINE GLUCONATE 4 % EX LIQD
60.0000 mL | Freq: Once | CUTANEOUS | Status: AC
  Filled 2014-10-04: qty 60

## 2014-10-04 MED ORDER — ALBUTEROL SULFATE HFA 108 (90 BASE) MCG/ACT IN AERS
INHALATION_SPRAY | RESPIRATORY_TRACT | Status: AC
  Filled 2014-10-04: qty 6.7

## 2014-10-04 MED ORDER — FENTANYL CITRATE 0.05 MG/ML IJ SOLN
50.0000 ug | INTRAMUSCULAR | Status: DC | PRN
  Administered 2014-10-04 – 2014-10-05 (×3): 50 ug via INTRAVENOUS
  Filled 2014-10-04 (×3): qty 2

## 2014-10-04 MED ORDER — ACETAMINOPHEN 650 MG RE SUPP: 650.0000 mg | Freq: Four times a day (QID) | RECTAL | Status: DC | PRN

## 2014-10-04 MED ORDER — ACETAMINOPHEN 325 MG PO TABS: 650.0000 mg | ORAL_TABLET | Freq: Four times a day (QID) | ORAL | Status: DC | PRN

## 2014-10-04 MED ORDER — SUCCINYLCHOLINE CHLORIDE 20 MG/ML IJ SOLN
INTRAMUSCULAR | Status: DC | PRN
  Administered 2014-10-04: 120 mg via INTRAVENOUS

## 2014-10-04 MED ORDER — MIDAZOLAM HCL 2 MG/2ML IJ SOLN
INTRAMUSCULAR | Status: AC
  Filled 2014-10-04: qty 2

## 2014-10-04 MED ORDER — PROPOFOL 10 MG/ML IV BOLUS
INTRAVENOUS | Status: DC | PRN
  Administered 2014-10-04: 100 mg via INTRAVENOUS

## 2014-10-04 MED ORDER — OXYCODONE HCL 5 MG PO TABS
5.0000 mg | ORAL_TABLET | ORAL | Status: DC | PRN
  Administered 2014-10-04: 5 mg via ORAL

## 2014-10-04 MED ORDER — MORPHINE SULFATE 2 MG/ML IJ SOLN: 0.5000 mg | INTRAMUSCULAR | Status: DC | PRN

## 2014-10-04 MED ORDER — DIPHENHYDRAMINE HCL 50 MG/ML IJ SOLN
25.0000 mg | Freq: Once | INTRAMUSCULAR | Status: AC
  Administered 2014-10-04: 25 mg via INTRAVENOUS
  Filled 2014-10-04: qty 1

## 2014-10-04 MED ORDER — 0.9 % SODIUM CHLORIDE (POUR BTL) OPTIME
TOPICAL | Status: DC | PRN
  Administered 2014-10-04: 1000 mL

## 2014-10-04 MED ORDER — DEXAMETHASONE SODIUM PHOSPHATE 4 MG/ML IJ SOLN
INTRAMUSCULAR | Status: DC | PRN
  Administered 2014-10-04: 4 mg via INTRAVENOUS

## 2014-10-04 MED ORDER — INFLUENZA VAC SPLIT QUAD 0.5 ML IM SUSY
0.5000 mL | PREFILLED_SYRINGE | INTRAMUSCULAR | Status: DC
  Filled 2014-10-04: qty 0.5

## 2014-10-04 MED ORDER — PHENYLEPHRINE HCL 10 MG/ML IJ SOLN
INTRAMUSCULAR | Status: AC
  Filled 2014-10-04: qty 1

## 2014-10-04 MED ORDER — ROCURONIUM BROMIDE 100 MG/10ML IV SOLN
INTRAVENOUS | Status: DC | PRN
  Administered 2014-10-04: 10 mg via INTRAVENOUS
  Administered 2014-10-04: 30 mg via INTRAVENOUS

## 2014-10-04 MED ORDER — DOCUSATE SODIUM 100 MG PO CAPS
100.0000 mg | ORAL_CAPSULE | Freq: Two times a day (BID) | ORAL | Status: DC
  Administered 2014-10-04 – 2014-10-06 (×4): 100 mg via ORAL
  Filled 2014-10-04 (×4): qty 1

## 2014-10-04 MED ORDER — CEFAZOLIN SODIUM-DEXTROSE 2-3 GM-% IV SOLR
2.0000 g | INTRAVENOUS | Status: DC
  Filled 2014-10-04: qty 50

## 2014-10-04 MED ORDER — ASPIRIN EC 325 MG PO TBEC
325.0000 mg | DELAYED_RELEASE_TABLET | Freq: Two times a day (BID) | ORAL | Status: DC
  Administered 2014-10-05 – 2014-10-06 (×3): 325 mg via ORAL
  Filled 2014-10-04 (×3): qty 1

## 2014-10-04 MED ORDER — POTASSIUM CHLORIDE IN NACL 40-0.9 MEQ/L-% IV SOLN
INTRAVENOUS | Status: AC
  Administered 2014-10-04: 75 mL/h via INTRAVENOUS
  Filled 2014-10-04: qty 1000

## 2014-10-04 MED ORDER — PHENOL 1.4 % MT LIQD: 1.0000 | OROMUCOSAL | Status: DC | PRN

## 2014-10-04 MED ORDER — LACTATED RINGERS IV SOLN
INTRAVENOUS | Status: DC
  Administered 2014-10-04: 18:00:00 via INTRAVENOUS

## 2014-10-04 MED ORDER — DEXAMETHASONE SODIUM PHOSPHATE 4 MG/ML IJ SOLN
INTRAMUSCULAR | Status: AC
  Filled 2014-10-04: qty 1

## 2014-10-04 MED ORDER — METHOCARBAMOL 500 MG PO TABS
ORAL_TABLET | ORAL | Status: AC
  Filled 2014-10-04: qty 1

## 2014-10-04 MED ORDER — MENTHOL 3 MG MT LOZG: 1.0000 | LOZENGE | OROMUCOSAL | Status: DC | PRN

## 2014-10-04 MED ORDER — METOCLOPRAMIDE HCL 5 MG PO TABS: 5.0000 mg | ORAL_TABLET | Freq: Three times a day (TID) | ORAL | Status: DC | PRN

## 2014-10-04 MED ORDER — NEOSTIGMINE METHYLSULFATE 10 MG/10ML IV SOLN
INTRAVENOUS | Status: DC | PRN
  Administered 2014-10-04: 5 mg via INTRAVENOUS

## 2014-10-04 MED ORDER — ONDANSETRON HCL 4 MG/2ML IJ SOLN
INTRAMUSCULAR | Status: DC | PRN
  Administered 2014-10-04: 4 mg via INTRAVENOUS

## 2014-10-04 MED ORDER — TRAMADOL HCL 50 MG PO TABS: 100.0000 mg | ORAL_TABLET | Freq: Four times a day (QID) | ORAL | Status: DC | PRN

## 2014-10-04 MED ORDER — PROPOFOL 10 MG/ML IV BOLUS
INTRAVENOUS | Status: AC
  Filled 2014-10-04: qty 20

## 2014-10-04 MED ORDER — FENTANYL CITRATE 0.05 MG/ML IJ SOLN
INTRAMUSCULAR | Status: AC
  Filled 2014-10-04: qty 5

## 2014-10-04 MED ORDER — PHENYLEPHRINE HCL 10 MG/ML IJ SOLN
10.0000 mg | INTRAVENOUS | Status: DC | PRN
  Administered 2014-10-04: 50 ug/min via INTRAVENOUS

## 2014-10-04 MED ORDER — SODIUM CHLORIDE 0.9 % IJ SOLN
INTRAMUSCULAR | Status: AC
  Filled 2014-10-04: qty 10

## 2014-10-04 MED ORDER — HYDROMORPHONE HCL 1 MG/ML IJ SOLN
0.5000 mg | INTRAMUSCULAR | Status: DC | PRN
  Administered 2014-10-04: 0.5 mg via INTRAVENOUS
  Filled 2014-10-04: qty 1

## 2014-10-04 SURGICAL SUPPLY — 54 items
BENZOIN TINCTURE PRP APPL 2/3 (GAUZE/BANDAGES/DRESSINGS) ×3 IMPLANT
BLADE SAW SGTL 18X1.27X75 (BLADE) ×2 IMPLANT
BLADE SAW SGTL 18X1.27X75MM (BLADE) ×1
BLADE SURG ROTATE 9660 (MISCELLANEOUS) IMPLANT
BNDG GAUZE ELAST 4 BULKY (GAUZE/BANDAGES/DRESSINGS) IMPLANT
CAPT HIP PF COP ×3 IMPLANT
CELLS DAT CNTRL 66122 CELL SVR (MISCELLANEOUS) ×1 IMPLANT
CLOSURE STERI-STRIP 1/2X4 (GAUZE/BANDAGES/DRESSINGS) ×1
CLOSURE WOUND 1/2 X4 (GAUZE/BANDAGES/DRESSINGS) ×2
CLSR STERI-STRIP ANTIMIC 1/2X4 (GAUZE/BANDAGES/DRESSINGS) ×2 IMPLANT
COVER SURGICAL LIGHT HANDLE (MISCELLANEOUS) ×3 IMPLANT
DRAPE C-ARM 42X72 X-RAY (DRAPES) ×3 IMPLANT
DRAPE STERI IOBAN 125X83 (DRAPES) ×3 IMPLANT
DRAPE U-SHAPE 47X51 STRL (DRAPES) ×9 IMPLANT
DRSG AQUACEL AG ADV 3.5X10 (GAUZE/BANDAGES/DRESSINGS) ×3 IMPLANT
DURAPREP 26ML APPLICATOR (WOUND CARE) ×3 IMPLANT
ELECT BLADE 4.0 EZ CLEAN MEGAD (MISCELLANEOUS) ×3
ELECT BLADE 6.5 EXT (BLADE) IMPLANT
ELECT CAUTERY BLADE 6.4 (BLADE) ×3 IMPLANT
ELECT REM PT RETURN 9FT ADLT (ELECTROSURGICAL) ×3
ELECTRODE BLDE 4.0 EZ CLN MEGD (MISCELLANEOUS) ×1 IMPLANT
ELECTRODE REM PT RTRN 9FT ADLT (ELECTROSURGICAL) ×1 IMPLANT
FACESHIELD WRAPAROUND (MASK) ×6 IMPLANT
GLOVE BIOGEL PI IND STRL 8 (GLOVE) ×2 IMPLANT
GLOVE BIOGEL PI INDICATOR 8 (GLOVE) ×4
GLOVE ECLIPSE 8.0 STRL XLNG CF (GLOVE) ×3 IMPLANT
GLOVE ORTHO TXT STRL SZ7.5 (GLOVE) ×6 IMPLANT
GOWN STRL REUS W/ TWL XL LVL3 (GOWN DISPOSABLE) ×2 IMPLANT
GOWN STRL REUS W/TWL XL LVL3 (GOWN DISPOSABLE) ×4
HANDPIECE INTERPULSE COAX TIP (DISPOSABLE) ×2
KIT BASIN OR (CUSTOM PROCEDURE TRAY) ×3 IMPLANT
KIT ROOM TURNOVER OR (KITS) ×3 IMPLANT
MANIFOLD NEPTUNE II (INSTRUMENTS) ×3 IMPLANT
NS IRRIG 1000ML POUR BTL (IV SOLUTION) ×3 IMPLANT
PACK TOTAL JOINT (CUSTOM PROCEDURE TRAY) ×3 IMPLANT
PAD ARMBOARD 7.5X6 YLW CONV (MISCELLANEOUS) ×6 IMPLANT
RTRCTR WOUND ALEXIS 18CM MED (MISCELLANEOUS) ×3
SCREW 6.5MMX25MM (Screw) ×3 IMPLANT
SET HNDPC FAN SPRY TIP SCT (DISPOSABLE) ×1 IMPLANT
SPONGE LAP 18X18 X RAY DECT (DISPOSABLE) ×3 IMPLANT
SPONGE LAP 4X18 X RAY DECT (DISPOSABLE) IMPLANT
STRIP CLOSURE SKIN 1/2X4 (GAUZE/BANDAGES/DRESSINGS) ×4 IMPLANT
SUT ETHIBOND NAB CT1 #1 30IN (SUTURE) ×3 IMPLANT
SUT MNCRL AB 4-0 PS2 18 (SUTURE) ×3 IMPLANT
SUT VIC AB 0 CT1 27 (SUTURE) ×2
SUT VIC AB 0 CT1 27XBRD ANBCTR (SUTURE) ×1 IMPLANT
SUT VIC AB 1 CT1 27 (SUTURE) ×2
SUT VIC AB 1 CT1 27XBRD ANBCTR (SUTURE) ×1 IMPLANT
SUT VIC AB 2-0 CT1 27 (SUTURE) ×2
SUT VIC AB 2-0 CT1 TAPERPNT 27 (SUTURE) ×1 IMPLANT
TOWEL OR 17X24 6PK STRL BLUE (TOWEL DISPOSABLE) ×3 IMPLANT
TOWEL OR 17X26 10 PK STRL BLUE (TOWEL DISPOSABLE) ×3 IMPLANT
TRAY FOLEY CATH 16FRSI W/METER (SET/KITS/TRAYS/PACK) IMPLANT
WATER STERILE IRR 1000ML POUR (IV SOLUTION) ×6 IMPLANT

## 2014-10-04 NOTE — Brief Op Note (Signed)
10/03/2014 - 10/04/2014  8:33 PM  PATIENT:  Michaela Guerra  68 y.o. female  PRE-OPERATIVE DIAGNOSIS:  right fem neck fracture  POST-OPERATIVE DIAGNOSIS:  right fem neck fracture  PROCEDURE:  Procedure(s): TOTAL HIP ARTHROPLASTY ANTERIOR APPROACH (Right)  SURGEON:  Surgeon(s) and Role:    * Mcarthur Rossetti, MD - Primary    * Naiping Eduard Roux, MD - Assisting  ANESTHESIA:   general  EBL:  Total I/O In: 1000 [I.V.:1000] Out: 89 [Urine:130; Blood:450]  BLOOD ADMINISTERED:none  DRAINS: none   LOCAL MEDICATIONS USED:  NONE  SPECIMEN:  No Specimen  DISPOSITION OF SPECIMEN:  N/A  COUNTS:  YES  TOURNIQUET:  * No tourniquets in log *  DICTATION: .Other Dictation: Dictation Number [id number]  PLAN OF CARE: Admit to inpatient   PATIENT DISPOSITION:  PACU - hemodynamically stable.   Delay start of Pharmacological VTE agent (>24hrs) due to surgical blood loss or risk of bleeding: no

## 2014-10-04 NOTE — Progress Notes (Signed)
CARE MANAGEMENT NOTE 10/04/2014  Patient:  Michaela Guerra, Michaela Guerra   Account Number:  [id number]  Date Initiated:  10/04/2014  Documentation initiated by:  Olga Coaster  Subjective/Objective Assessment:   ADMITTED WITH HIP FRACTURE     Action/Plan:   CM FOLLOWING FOR DCP   Anticipated DC Date:  10/08/2014   Anticipated DC Plan:  PATIENT IS FOR SURGERY TODAY; CM FOLLOWING FOR DCP     DC Planning Services  CM consult        Status of service:  In process, will continue to follow Medicare Important Message given?   (If response is "NO", the following Medicare IM given date fields will be blank)  Per UR Regulation:  Reviewed for med. necessity/level of care/duration of stay  Comments:  10/27/2015Mindi Slicker RN,BSN,MHA [phone]

## 2014-10-04 NOTE — Progress Notes (Signed)
Patient ID: Michaela Guerra, female   DOB: 1946-08-01, 68 y.o.   MRN: [id number] I have seen and examined [name] at the request of another orthopedic surgeon.  She has an acute right hip femoral neck fracture.  She does has pre-existing right hip groin pain and OA.  It is recommended that she undergo a right total hip replacement thru a direct anterior approach.  She lives independently and ambulated before without any assistive device.  She fully understands that surgery is being recommended as well as the risks and benefits involved.  Surgery will be late today.

## 2014-10-04 NOTE — Anesthesia Preprocedure Evaluation (Addendum)
Anesthesia Evaluation  Patient identified by MRN, date of birth, ID band Patient awake    Reviewed: Allergy & Precautions, H&P , NPO status , Patient's Chart, lab work & pertinent test results, reviewed documented beta blocker date and time , Unable to perform ROS - Chart review only  Airway        Dental   Pulmonary COPD COPD inhaler, Current Smoker,          Cardiovascular hypertension, Pt. on medications  '14 ECHO: EF 44-01%, grade 1 diastolic dysfunction, valves ok   Neuro/Psych Anxiety Depression CVA, Residual Symptoms    GI/Hepatic GERD-  Medicated and Controlled,(+)     substance abuse  alcohol use,   Endo/Other  Hypothyroidism   Renal/GU      Musculoskeletal   Abdominal   Peds  Hematology  (+) anemia ,   Anesthesia Other Findings Breast cancer  Reproductive/Obstetrics                         Anesthesia Physical Anesthesia Plan  ASA: III  Anesthesia Plan: General   Post-op Pain Management:    Induction: Intravenous, Rapid sequence and Cricoid pressure planned  Airway Management Planned: Oral ETT  Additional Equipment:   Intra-op Plan:   Post-operative Plan: Extubation in OR  Informed Consent: I have reviewed the patients History and Physical, chart, labs and discussed the procedure including the risks, benefits and alternatives for the proposed anesthesia with the patient or authorized representative who has indicated his/her understanding and acceptance.   Dental advisory given  Plan Discussed with:   Anesthesia Plan Comments:         Anesthesia Quick Evaluation

## 2014-10-04 NOTE — Anesthesia Procedure Notes (Signed)
Procedure Name: Intubation Date/Time: 10/04/2014 6:56 PM Performed by: Hollie Salk Z Pre-anesthesia Checklist: Patient identified, Timeout performed, Emergency Drugs available, Suction available and Patient being monitored Patient Re-evaluated:Patient Re-evaluated prior to inductionOxygen Delivery Method: Circle system utilized Preoxygenation: Pre-oxygenation with 100% oxygen Intubation Type: IV induction, Rapid sequence and Cricoid Pressure applied Laryngoscope Size: Mac and 3 Grade View: Grade I Tube type: Oral Tube size: 7.5 mm Number of attempts: 1 Airway Equipment and Method: Stylet Placement Confirmation: ETT inserted through vocal cords under direct vision,  breath sounds checked- equal and bilateral and positive ETCO2 Secured at: 22 cm Dental Injury: Teeth and Oropharynx as per pre-operative assessment

## 2014-10-04 NOTE — Progress Notes (Addendum)
Patient Demographics  Michaela Guerra, is a 68 y.o. female, DOB - 01/11/1946, ZDG:[id number]  Admit date - 10/03/2014   Admitting Physician Phillips Grout, MD  Outpatient Primary MD for the patient is London Pepper, MD  LOS - 1   Chief Complaint  Patient presents with  . Fall  . Hip Pain        Subjective:   [name] today has, No headache, No chest pain, No abdominal pain - No Nausea, No new weakness tingling or numbness, No Cough - SOB. Mild ongoing right hip pain.  Assessment & Plan    1. Mechanical fall with closed displaced right femoral neck fracture. Will require ORIF by orthopedics on 10/04/2014, request orthopedics to order appropriate DVT prophylaxis and weightbearing status.  Cardio-Pulm Risk stratification for surgery and recommendations to minimize the same:-  A.Cardio-Pulmonary Risk -  this patient is a moderate for adverse Cardio-Pulmonary  Outcome  from surgery, the risks and benefits were discussed and acceptable to the patient.  Recommendations for optimizing Cardio-Pulmonary  Risk risk factors  1. Keep SBP<140, HR<85, use Lopressor 5mg  IV q4hrs PRN, or B.Blocker drip PRN. 2. Moniotr I&Os. 3. Minimal sedation and Narcotics. 4. Good pulmunary toilet. 5. PRN Nebs and as needed oxygen to keep Pox>90% 6. Hb>8, transfuse as needed- Lasix 10mg  IV after each unit PRBC Transfused.   B.Bleeding Risk - no previous surgical complications, no easy bruising,  Antiplate meds to surgery was aspirin 81 mg daily.   Lab Results  Component Value Date   PLT 226 10/04/2014                  Lab Results  Component Value Date   INR 0.96 05/09/2011      Will request Surgeon to please Order DVT prophylaxis of his/her choice, along with activity, weight bearing precautions and diet  if appropriate.     2. Hypothyroidism no acute issues. Continue home dose Synthroid.    3. History of COPD and smoking. Counseled to quit, no acute issues, no wheezing, no oxygen demand. Supportive care with normalizing treatments and oxygen as needed.    4. Essential hypertension. On Norvasc. As needed IV Lopressor ordered.      5. History of CVA. On aspirin currently on hold. Continue statin.    6. Dyslipidemia. On statin.    7. Low K. Replace IV. Recheck.      Code Status: Full  Family Communication: None present  Disposition Plan: To be decided   Procedures ORIF right hip due on 10/04/2014   Consults  Ortho   Medications  Scheduled Meds: . amLODipine  2.5 mg Oral Daily  . levothyroxine  25 mcg Oral QAC breakfast  . nicotine  21 mg Transdermal Daily  . simvastatin  10 mg Oral q1800   Continuous Infusions: . 0.9 % NaCl with KCl 40 mEq / L     PRN Meds:.fentaNYL, HYDROmorphone (DILAUDID) injection, methocarbamol (ROBAXIN) IV, methocarbamol, metoprolol  DVT Prophylaxis    SCDs    Lab Results  Component Value Date   PLT 226 10/04/2014    Antibiotics    Anti-infectives   None          Objective:   Filed Vitals:   10/04/14  0117 10/04/14 0400 10/04/14 0600 10/04/14 0800  BP: 132/68  139/74   Pulse: 76  83   Temp: 98.2 F (36.8 C)  98.7 F (37.1 C)   TempSrc: Oral  Oral   Resp: 20 16 18 16   Height:      SpO2: 95% 94% 95%     Wt Readings from Last 3 Encounters:  11/13/13 66.4 kg (146 lb 6.2 oz)  12/29/12 66.679 kg (147 lb)  07/26/10 68.72 kg (151 lb 8 oz)     Intake/Output Summary (Last 24 hours) at 10/04/14 0944 Last data filed at 10/04/14 0600  Gross per 24 hour  Intake      0 ml  Output    375 ml  Net   -375 ml     Physical Exam  Awake Alert, Oriented X 3, No new F.N deficits, Normal affect [place] I.AT,PERRAL Supple Neck,No JVD, No cervical lymphadenopathy appriciated.  Symmetrical Chest wall movement, Good air  movement bilaterally, CTAB RRR,No Gallops,Rubs or new Murmurs, No Parasternal Heave +ve B.Sounds, Abd Soft, No tenderness, No organomegaly appriciated, No rebound - guarding or rigidity. No Cyanosis, Clubbing or edema, No new Rash or bruise      Data Review   Micro Results Recent Results (from the past 240 hour(s))  SURGICAL PCR SCREEN     Status: None   Collection Time    10/04/14  5:41 AM      Result Value Ref Range Status   MRSA, PCR NEGATIVE  NEGATIVE Final   Staphylococcus aureus NEGATIVE  NEGATIVE Final   Comment:            The Xpert SA Assay (FDA     approved for NASAL specimens     in patients over 24 years of age),     is one component of     a comprehensive surveillance     program.  Test performance has     been validated by [employer] for patients greater     than or equal to 7 year old.     It is not intended     to diagnose infection nor to     guide or monitor treatment.    Radiology Reports Dg Chest 1 View  10/03/2014   CLINICAL DATA:  Fall at home.  EXAM: CHEST - 1 VIEW  COMPARISON:  None.  FINDINGS: The heart size and mediastinal contours are within normal limits. Both lungs are clear. The visualized skeletal structures are unremarkable.  IMPRESSION: No acute cardiopulmonary abnormality seen.   Electronically Signed   By: Sabino Dick M.D.   On: 10/03/2014 18:10   Dg Wrist Complete Right  10/03/2014   CLINICAL DATA:  Fall.  Radial sided wrist pain.  Initial encounter  EXAM: RIGHT WRIST - COMPLETE 3+ VIEW  COMPARISON:  None.  FINDINGS: There is no evidence of fracture or dislocation.  There is mild degenerative narrowing of the radioscaphoid joint. Moderate degenerative spurring to the first [facility] joint.  IMPRESSION: No acute osseous findings.   Electronically Signed   By: Jorje Guild M.D.   On: 10/03/2014 18:08   Dg Hip Complete Right  10/03/2014   CLINICAL DATA:  Fall with right hip pain.  Initial encounter  EXAM: RIGHT HIP - COMPLETE 2+ VIEW   COMPARISON:  None.  FINDINGS: There is an acute fracture of the right femoral neck, basicervical. No intertrochanteric component is identified. There is mild impaction and varus angulation. The femoral head  remains located.  No evidence of pelvic ring fracture or diastasis. The left hip is located and intact.  IMPRESSION: Displaced right femoral neck fracture.   Electronically Signed   By: Jorje Guild M.D.   On: 10/03/2014 18:09   Mm Diag Breast Tomo Bilateral  09/07/2014   CLINICAL DATA:  68 year old female with history of left breast cancer post lumpectomy in 2007 followed by radiation therapy.  EXAM: DIGITAL DIAGNOSTIC BILATERAL MAMMOGRAM WITH 3D TOMOSYNTHESIS AND CAD  COMPARISON:  Previous mammograms.  ACR Breast Density Category b: There are scattered areas of fibroglandular density.  FINDINGS: No suspicious masses or calcifications are seen in either breast. Postlumpectomy changes are present in the upper slightly inner left breast. A spot compression magnification tangential view of the lumpectomy site in the left breast was performed. There is no mammographic evidence of locally recurrent malignancy.  Mammographic images were processed with CAD.  IMPRESSION: No mammographic evidence of malignancy in either breast.  RECOMMENDATION: Screening mammogram in one year.(Code:SM-B-01Y)  I have discussed the findings and recommendations with the patient. Results were also provided in writing at the conclusion of the visit. If applicable, a reminder letter will be sent to the patient regarding the next appointment.  BI-RADS CATEGORY  2: Benign.   Electronically Signed   By: Everlean Alstrom M.D.   On: 09/07/2014 14:23     CBC  Recent Labs Lab 10/03/14 1713 10/04/14 0454  WBC 9.6 10.2  HGB 14.3 14.0  HCT 41.5 41.7  PLT 233 226  MCV 90.4 93.3  MCH 31.2 31.3  MCHC 34.5 33.6  RDW 14.1 14.3  LYMPHSABS 1.8  --   MONOABS 0.7  --   EOSABS 0.1  --   BASOSABS 0.0  --     Chemistries   Recent  Labs Lab 10/03/14 1713 10/04/14 0454  NA 138 137  K 3.5* 3.7  CL 102 101  CO2 22 19  GLUCOSE 116* 135*  BUN 14 14  CREATININE 0.71 0.72  CALCIUM 9.4 9.2  AST 18  --   ALT 16  --   ALKPHOS 77  --   BILITOT 0.6  --    ------------------------------------------------------------------------------------------------------------------ CrCl is unknown because both a height and weight (above a minimum accepted value) are required for this calculation. ------------------------------------------------------------------------------------------------------------------ No results found for this basename: HGBA1C,  in the last 72 hours ------------------------------------------------------------------------------------------------------------------ No results found for this basename: CHOL, HDL, LDLCALC, TRIG, CHOLHDL, LDLDIRECT,  in the last 72 hours ------------------------------------------------------------------------------------------------------------------ No results found for this basename: TSH, T4TOTAL, FREET3, T3FREE, THYROIDAB,  in the last 72 hours ------------------------------------------------------------------------------------------------------------------ No results found for this basename: VITAMINB12, FOLATE, FERRITIN, TIBC, IRON, RETICCTPCT,  in the last 72 hours  Coagulation profile No results found for this basename: INR, PROTIME,  in the last 168 hours  No results found for this basename: DDIMER,  in the last 72 hours  Cardiac Enzymes No results found for this basename: CK, CKMB, TROPONINI, MYOGLOBIN,  in the last 168 hours ------------------------------------------------------------------------------------------------------------------ No components found with this basename: POCBNP,      Time Spent in minutes   35   SINGH,PRASHANT K M.D on 10/04/2014 at 9:44 AM  Between 7am to 7pm - Pager - [phone]  After 7pm go to [email or web address] - password [email or web address]  And  look for the night coverage person covering for me after hours  [facility] Office  [phone]

## 2014-10-04 NOTE — Progress Notes (Signed)
Report given to Lisa, RN.

## 2014-10-04 NOTE — Transfer of Care (Signed)
Immediate Anesthesia Transfer of Care Note  Patient: Michaela Guerra  Procedure(s) Performed: Procedure(s): TOTAL HIP ARTHROPLASTY ANTERIOR APPROACH (Right)  Patient Location: PACU  Anesthesia Type:General  Level of Consciousness: awake and patient cooperative  Airway & Oxygen Therapy: Patient Spontanous Breathing and Patient connected to face mask oxygen  Post-op Assessment: Report given to PACU RN and Post -op Vital signs reviewed and stable  Post vital signs: Reviewed and stable  Complications: No apparent anesthesia complications

## 2014-10-04 NOTE — Anesthesia Postprocedure Evaluation (Signed)
Anesthesia Post Note  Patient: Michaela Guerra  Procedure(s) Performed: Procedure(s) (LRB): TOTAL HIP ARTHROPLASTY ANTERIOR APPROACH (Right)  Anesthesia type: general  Patient location: PACU  Post pain: Pain level controlled  Post assessment: Patient's Cardiovascular Status Stable  Last Vitals:  Filed Vitals:   10/04/14 2134  BP:   Pulse: 88  Temp:   Resp: 16    Post vital signs: Reviewed and stable  Level of consciousness: sedated  Complications: No apparent anesthesia complications

## 2014-10-04 NOTE — Progress Notes (Signed)
12 lead EKG performed. Placed in pt chart.

## 2014-10-04 NOTE — Consult Note (Signed)
Orthopaedic Trauma Service (OTS)  Reason for Consult: R hip fracture  Referring Physician:  Ruel Favors, MD (ortho)   HPI: Michaela Guerra is an 68 y.o. white female who sustained a fall yesterday after tripping over a curb. Pt was trying to take something out of the car when she tripped over the curb. Pt landed on her Right hip and R wrist.  Pt was brought to [facility] hospital and was found to have a R femoral neck fracture.  Pt was admitted to medicine with ortho consult.  OTS requested to evaluate the pt to expedite her care.  Pt seen 4N32 c/o R hip and R wrist pain.  She states that her R hip pain is now under control.  Denies any additional issues other than R wrist  Pt does report R groin pain before her fall as well She is very independent. Lives in an independent living facility but per pts report this facility is too old for her  Does not use any assistive devices for ambulation  She does state that she was not as active as she would like to be but relates this to thyroid issues and now that this is corrected she is hopeful to be more active   Pt also reportedly had some type of cerebrovascular incident last year but has not had any neurology followup and has not had any perceivable deficits      Past Medical History  Diagnosis Date  . Hypothyroidism   . Breast cancer     Adenocarcinoma  . Hypertension   . COPD (chronic obstructive pulmonary disease)   . Stroke 11/2013  . GERD (gastroesophageal reflux disease)     Past Surgical History  Procedure Laterality Date  . Breast surgery      Family History  Problem Relation Age of Onset  . Breast cancer Mother   . Stroke Mother   . Stroke Father   . Breast cancer Sister   . Hypertension Sister   . Hypertension Sister   . Dementia Sister     Social History:  reports that she has been smoking.  She does not have any smokeless tobacco history on file. She reports that she drinks alcohol. She reports that she does not use illicit  drugs. Has smoked 1 PPD since age of 24   Allergies:  Allergies  Allergen Reactions  . Codeine     REACTION: causes skin to feel like it's crawling  . Levofloxacin     REACTION: nausea    Medications: I have reviewed the patient's current medications.  Results for orders placed during the hospital encounter of 10/03/14 (from the past 48 hour(s))  CBC WITH DIFFERENTIAL     Status: None   Collection Time    10/03/14  5:13 PM      Result Value Ref Range   WBC 9.6  4.0 - 10.5 K/uL   RBC 4.59  3.87 - 5.11 MIL/uL   Hemoglobin 14.3  12.0 - 15.0 g/dL   HCT 41.5  36.0 - 46.0 %   MCV 90.4  78.0 - 100.0 fL   MCH 31.2  26.0 - 34.0 pg   MCHC 34.5  30.0 - 36.0 g/dL   RDW 14.1  11.5 - 15.5 %   Platelets 233  150 - 400 K/uL   Neutrophils Relative % 72  43 - 77 %   Neutro Abs 6.9  1.7 - 7.7 K/uL   Lymphocytes Relative 19  12 - 46 %  Lymphs Abs 1.8  0.7 - 4.0 K/uL   Monocytes Relative 7  3 - 12 %   Monocytes Absolute 0.7  0.1 - 1.0 K/uL   Eosinophils Relative 2  0 - 5 %   Eosinophils Absolute 0.1  0.0 - 0.7 K/uL   Basophils Relative 0  0 - 1 %   Basophils Absolute 0.0  0.0 - 0.1 K/uL  COMPREHENSIVE METABOLIC PANEL     Status: Abnormal   Collection Time    10/03/14  5:13 PM      Result Value Ref Range   Sodium 138  137 - 147 mEq/L   Potassium 3.5 (*) 3.7 - 5.3 mEq/L   Chloride 102  96 - 112 mEq/L   CO2 22  19 - 32 mEq/L   Glucose, Bld 116 (*) 70 - 99 mg/dL   BUN 14  6 - 23 mg/dL   Creatinine, Ser 0.71  0.50 - 1.10 mg/dL   Calcium 9.4  8.4 - 10.5 mg/dL   Total Protein 6.5  6.0 - 8.3 g/dL   Albumin 3.7  3.5 - 5.2 g/dL   AST 18  0 - 37 U/L   ALT 16  0 - 35 U/L   Alkaline Phosphatase 77  39 - 117 U/L   Total Bilirubin 0.6  0.3 - 1.2 mg/dL   GFR calc non Af Amer 86 (*) >90 mL/min   GFR calc Af Amer >90  >90 mL/min   Comment: (NOTE)     The eGFR has been calculated using the CKD EPI equation.     This calculation has not been validated in all clinical situations.     eGFR's  persistently <90 mL/min signify possible Chronic Kidney     Disease.   Anion gap 14  5 - 15  TYPE AND SCREEN     Status: None   Collection Time    10/03/14  5:13 PM      Result Value Ref Range   ABO/RH(D) O NEG     Antibody Screen NEG     Sample Expiration 10/06/2014    ABO/RH     Status: None   Collection Time    10/03/14  5:13 PM      Result Value Ref Range   ABO/RH(D) O NEG    URINALYSIS, ROUTINE W REFLEX MICROSCOPIC     Status: Abnormal   Collection Time    10/03/14  7:18 PM      Result Value Ref Range   Color, Urine YELLOW  YELLOW   APPearance CLEAR  CLEAR   Specific Gravity, Urine 1.020  1.005 - 1.030   pH 5.5  5.0 - 8.0   Glucose, UA NEGATIVE  NEGATIVE mg/dL   Hgb urine dipstick NEGATIVE  NEGATIVE   Bilirubin Urine NEGATIVE  NEGATIVE   Ketones, ur 15 (*) NEGATIVE mg/dL   Protein, ur NEGATIVE  NEGATIVE mg/dL   Urobilinogen, UA 0.2  0.0 - 1.0 mg/dL   Nitrite NEGATIVE  NEGATIVE   Leukocytes, UA NEGATIVE  NEGATIVE   Comment: MICROSCOPIC NOT DONE ON URINES WITH NEGATIVE PROTEIN, BLOOD, LEUKOCYTES, NITRITE, OR GLUCOSE <1000 mg/dL.  GLUCOSE, CAPILLARY     Status: Abnormal   Collection Time    10/03/14 10:27 PM      Result Value Ref Range   Glucose-Capillary 162 (*) 70 - 99 mg/dL  CBC     Status: None   Collection Time    10/04/14  4:54 AM      Result  Value Ref Range   WBC 10.2  4.0 - 10.5 K/uL   RBC 4.47  3.87 - 5.11 MIL/uL   Hemoglobin 14.0  12.0 - 15.0 g/dL   HCT 41.7  36.0 - 46.0 %   MCV 93.3  78.0 - 100.0 fL   MCH 31.3  26.0 - 34.0 pg   MCHC 33.6  30.0 - 36.0 g/dL   RDW 14.3  11.5 - 15.5 %   Platelets 226  150 - 400 K/uL  BASIC METABOLIC PANEL     Status: Abnormal   Collection Time    10/04/14  4:54 AM      Result Value Ref Range   Sodium 137  137 - 147 mEq/L   Potassium 3.7  3.7 - 5.3 mEq/L   Chloride 101  96 - 112 mEq/L   CO2 19  19 - 32 mEq/L   Glucose, Bld 135 (*) 70 - 99 mg/dL   BUN 14  6 - 23 mg/dL   Creatinine, Ser 0.72  0.50 - 1.10 mg/dL    Calcium 9.2  8.4 - 10.5 mg/dL   GFR calc non Af Amer 86 (*) >90 mL/min   GFR calc Af Amer >90  >90 mL/min   Comment: (NOTE)     The eGFR has been calculated using the CKD EPI equation.     This calculation has not been validated in all clinical situations.     eGFR's persistently <90 mL/min signify possible Chronic Kidney     Disease.   Anion gap 17 (*) 5 - 15  SURGICAL PCR SCREEN     Status: None   Collection Time    10/04/14  5:41 AM      Result Value Ref Range   MRSA, PCR NEGATIVE  NEGATIVE   Staphylococcus aureus NEGATIVE  NEGATIVE   Comment:            The Xpert SA Assay (FDA     approved for NASAL specimens     in patients over 101 years of age),     is one component of     a comprehensive surveillance     program.  Test performance has     been validated by [employer] for patients greater     than or equal to 83 year old.     It is not intended     to diagnose infection nor to     guide or monitor treatment.    Dg Chest 1 View  10/03/2014   CLINICAL DATA:  Fall at home.  EXAM: CHEST - 1 VIEW  COMPARISON:  None.  FINDINGS: The heart size and mediastinal contours are within normal limits. Both lungs are clear. The visualized skeletal structures are unremarkable.  IMPRESSION: No acute cardiopulmonary abnormality seen.   Electronically Signed   By: Sabino Dick M.D.   On: 10/03/2014 18:10   Dg Wrist Complete Right  10/03/2014   CLINICAL DATA:  Fall.  Radial sided wrist pain.  Initial encounter  EXAM: RIGHT WRIST - COMPLETE 3+ VIEW  COMPARISON:  None.  FINDINGS: There is no evidence of fracture or dislocation.  There is mild degenerative narrowing of the radioscaphoid joint. Moderate degenerative spurring to the first [facility] joint.  IMPRESSION: No acute osseous findings.   Electronically Signed   By: Jorje Guild M.D.   On: 10/03/2014 18:08   Dg Hip Complete Right  10/03/2014   CLINICAL DATA:  Fall with right hip pain.  Initial encounter  EXAM: RIGHT HIP - COMPLETE 2+  VIEW  COMPARISON:  None.  FINDINGS: There is an acute fracture of the right femoral neck, basicervical. No intertrochanteric component is identified. There is mild impaction and varus angulation. The femoral head remains located.  No evidence of pelvic ring fracture or diastasis. The left hip is located and intact.  IMPRESSION: Displaced right femoral neck fracture.   Electronically Signed   By: Jorje Guild M.D.   On: 10/03/2014 18:09    Review of Systems  Constitutional: Negative for fever and chills.  Respiratory: Negative for shortness of breath and wheezing.   Cardiovascular: Negative for chest pain and palpitations.  Gastrointestinal: Negative for nausea, vomiting and abdominal pain.  Genitourinary: Negative for dysuria.  Musculoskeletal:       R hip and R wrist pain   Neurological: Negative for tingling, sensory change, focal weakness and headaches.   Blood pressure 105/54, pulse 81, temperature 99.1 F (37.3 C), temperature source Oral, resp. rate 16, height _0  (1.651 m), SpO2 94.00%. Physical Exam  Constitutional: She appears well-developed and well-nourished. No distress.  HENT:  Head: Normocephalic and atraumatic.  Eyes: EOM are normal. Pupils are equal, round, and reactive to light.  Neck: Normal range of motion.  Cardiovascular: Normal rate and regular rhythm.   Respiratory: Effort normal.  Dec BS at bases   GI: Soft. Normal appearance and bowel sounds are normal. There is no tenderness.  Musculoskeletal:       Right shoulder: Normal.  Pelvis   No instability    No pain with bony eval  Right Lower Extremity  Inspection:   Leg in slight external rotation   Not noticeably short Bony eval:   Hip TTP   Knee, lower leg, ankle and foot nontender Soft tissue:   Soft tissue w/o open wounds or lesions   No ecchymosis  ROM:    ROM of hip and knee not assessed    Ankle ROM full  Sensation:    DPN, SPN, TN sensation intact Motor:   EHL, FHL, AT, PT, peroneals,  gastroc motor intact Vascular:   + DP pulse   Compartments soft and NT  Right Upper extremity  Inspection:   Mild swelling at wrist    No obvious deformity  Bony eval:   TTP radial aspect of distal radius   No crepitus or gross motion noted at distal radius    Mild snuffbox tendernes Soft tissue:    No open wounds or lesion   + swelling about the wrist  ROM:   Pt can perform active wrist flexion and extension albeit slowly    Forearm, elbow, shoulder unremarkable  Sensation:   R/U/M/Ax sensation intact Motor:  R/U/M/AIN/PIN motor intact Vascular:   Ext warm    + radial pulse    Compartments soft      Assessment/Plan:  68 y/o female s/p fall with R basicervical femoral neck fracture and R wrist pain   1. Fall  2. R basicervical femoral neck fracture   Given presence of pre-existing R groin pain, pts age and activity level we thing it would be best to proceed with THA. We have contacted [facility] surgeon to eval pt  Probable OR today   3. R wrist pain   xrays do not show acute fracture   Monitor progress   May need bracing or other studies if does not improve   4. Dispo  Defer further management to [facility] Specialist  Jari Pigg, PA-C [facility] [phone] (P) 10/04/2014, 12:01 PM

## 2014-10-05 DIAGNOSIS — W19XXXA Unspecified fall, initial encounter: Secondary | ICD-10-CM | POA: Insufficient documentation

## 2014-10-05 LAB — BASIC METABOLIC PANEL
Anion gap: 11 (ref 5–15)
BUN: 10 mg/dL (ref 6–23)
CALCIUM: 8.7 mg/dL (ref 8.4–10.5)
CO2: 24 meq/L (ref 19–32)
CREATININE: 0.66 mg/dL (ref 0.50–1.10)
Chloride: 101 mEq/L (ref 96–112)
GFR calc Af Amer: >90 mL/min (ref >90)
GFR calc non Af Amer: 88 mL/min — ABNORMAL LOW (ref >90)
GLUCOSE: 175 mg/dL — AB (ref 70–99)
Potassium: 4.1 mEq/L (ref 3.7–5.3)
Sodium: 136 mEq/L — ABNORMAL LOW (ref 137–147)

## 2014-10-05 LAB — CBC
HCT: 35.4 % — ABNORMAL LOW (ref 36.0–46.0)
Hemoglobin: 11.7 g/dL — ABNORMAL LOW (ref 12.0–15.0)
MCH: 31.1 pg (ref 26.0–34.0)
MCHC: 33.1 g/dL (ref 30.0–36.0)
MCV: 94.1 fL (ref 78.0–100.0)
PLATELETS: 185 10*3/uL (ref 150–400)
RBC: 3.76 MIL/uL — AB (ref 3.87–5.11)
RDW: 14.1 % (ref 11.5–15.5)
WBC: 11.9 10*3/uL — ABNORMAL HIGH (ref 4.0–10.5)

## 2014-10-05 MED ORDER — DEXTROSE 5 % IV SOLN
1.0000 g | INTRAVENOUS | Status: DC
  Filled 2014-10-05: qty 10

## 2014-10-05 MED ORDER — ASPIRIN 325 MG PO TBEC: 325.0000 mg | DELAYED_RELEASE_TABLET | Freq: Two times a day (BID) | ORAL | Status: DC

## 2014-10-05 MED ORDER — OXYCODONE-ACETAMINOPHEN 5-325 MG PO TABS: 1.0000 | ORAL_TABLET | ORAL | Status: DC | PRN

## 2014-10-05 NOTE — Evaluation (Signed)
Physical Therapy Evaluation Patient Details Name: Michaela Guerra MRN: [id number] DOB: 10-20-1946 Today's Date: 10/05/2014   History of Present Illness  Adm 10/03/14 s/p fall with rt hip fx and rt wrist sprain (xray negative). 10/27 Rt THA anterior approach PMHx- Lt CVA with some Rt residual weakness (transient per pt); COPD, HTN, breast Ca  Clinical Impression  Pt is s/p Rt THA s/p fall with hip fx (and Rt wrist [place]) resulting in the deficits listed below (see PT Problem List).  Pt will benefit from skilled PT to increase their independence and safety with mobility to allow discharge to the venue listed below. Pt is motivated, however unable to tolerate stand-pivot or even squat-pivot OOB to [facility]. Anticipate will need SNF.        Follow Up Recommendations SNF;Supervision/Assistance - 24 hour    Equipment Recommendations  Rolling walker with 5" wheels (Rt platform attachment)    Recommendations for Other Services OT consult     Precautions / Restrictions Precautions Precautions: Fall (no hip precautions) Restrictions Weight Bearing Restrictions: Yes RLE Weight Bearing: Weight bearing as tolerated Other Position/Activity Restrictions: Rt wrist foremarm wrapped in ace wrap      Mobility  Bed Mobility Overal bed mobility: Needs Assistance Bed Mobility: Supine to Sit;Sit to Supine     Supine to sit: Min assist;HOB elevated (20 degrees, with rail) Sit to supine: Min assist;HOB elevated (20 degrees, with rail)   General bed mobility comments: pt educated on how to scoot herself to [facility] with LLE and LUE on rail  Transfers Overall transfer level: Needs assistance Equipment used: Rolling walker (2 wheeled);None Transfers: Sit to/from [employer] Sit to Stand: Mod assist   Squat pivot transfers: Mod assist     General transfer comment: unable to complete either transfer fully (cleared buttocks off bed ~6 inches and then pt stopped due to pain and requested to  return to sitting); unable to pivot to [facility] on her left  Ambulation/Gait                Stairs            Wheelchair Mobility    Modified Rankin (Stroke Patients Only)       Balance Overall balance assessment: Needs assistance;History of Falls Sitting-balance support: Single extremity supported;Feet supported Sitting balance-Leahy Scale: Poor Sitting balance - Comments: leans Lt to offload Rt hip   Standing balance support: Bilateral upper extremity supported Standing balance-Leahy Scale: Poor Standing balance comment: unable to achieve full standing                             Pertinent Vitals/Pain SaO2 on 4L 91%, pt reports she does not use O2 at home On Room air decr to 75%, with O2 resumed, slowly incr to 88% over 3 minutes  Pain Assessment: 0-10 Pain Score: 0-No pain    Home Living Family/patient expects to be discharged to:: Private residence (Independent living) Living Arrangements: Alone Available Help at Discharge: Family;Available PRN/intermittently (4 daughters) Type of Home: Independent living facility Home Access: Ramped entrance;Elevator     Home Layout: One level Home Equipment: Grab bars - tub/shower Additional Comments: states she has to walk a long way from entrance, to elevator, to her apartment    Prior Function Level of Independence: Independent         Comments: driving, cooking     Hand Dominance   Dominant Hand: Left    Extremity/Trunk Assessment  Upper Extremity Assessment: RUE deficits/detail RUE Deficits / Details: wrapped in ace wrap; unable to tolerate use of RW in rt hand RUE: Unable to fully assess due to immobilization       Lower Extremity Assessment: RLE deficits/detail RLE Deficits / Details: AAROM limited due to pain (supine hip flexion to 60; seated PROM to 90; overall limited due to pain, although pt denies pain!    Cervical / Trunk Assessment: Normal  Communication   Communication: No  difficulties  Cognition Arousal/Alertness: Awake/alert Behavior During Therapy: Impulsive;Restless Overall Cognitive Status: Within Functional Limits for tasks assessed Area of Impairment: Safety/judgement         Safety/Judgement: Decreased awareness of safety;Decreased awareness of deficits     General Comments: thought she could take a shower and go home today; after attempt OOB, she realized this was not realistic, however hopes home alone tomorrow    General Comments      Exercises [facility] Exercises Ankle Circles/Pumps: AROM;Both;10 reps Heel Slides: AAROM;Right;5 reps Long Arc Quad: AROM;Right;5 reps Bridges: AROM;Left;Other reps (comment)      Assessment/Plan    PT Assessment Patient needs continued PT services  PT Diagnosis Difficulty walking;Acute pain   PT Problem List Decreased strength;Decreased range of motion;Decreased activity tolerance;Decreased balance;Decreased mobility;Decreased knowledge of use of DME;Decreased safety awareness;Pain  PT Treatment Interventions DME instruction;Gait training;Functional mobility training;Therapeutic activities;Therapeutic exercise;Balance training;Cognitive remediation;Patient/family education   PT Goals (Current goals can be found in the Care Plan section) Acute Rehab PT Goals Patient Stated Goal: to take a shower PT Goal Formulation: With patient Time For Goal Achievement: 10/12/14 Potential to Achieve Goals: Good    Frequency Min 5X/week   Barriers to discharge Decreased caregiver support      Co-evaluation               End of Session Equipment Utilized During Treatment: Gait belt;Oxygen Activity Tolerance: Patient limited by pain;Treatment limited secondary to medical complications (Comment) (decr SaO2) Patient left: in bed;with call bell/phone within reach;with nursing/sitter in room;with family/visitor present Nurse Communication: Mobility status (need for platform to RW)         Time:  0254-2706 PT Time Calculation (min): 26 min   Charges:   PT Evaluation $Initial PT Evaluation Tier I: 1 Procedure PT Treatments $Therapeutic Activity: 8-22 mins   PT G Codes:          Amada Hallisey 11-03-2014, 10:03 AM Pager [phone]

## 2014-10-05 NOTE — Progress Notes (Signed)
Orthopedic Tech Progress Note Patient Details:  Michaela Guerra 1946/09/15 [id number] Applied Velcro wrist splint to RUE.  Pulses, sensation, motion intact before and after application.  Capillary refill less than 2 seconds before and after application. Ortho Devices Type of Ortho Device: Wrist splint Ortho Device/Splint Location: RUE Ortho Device/Splint Interventions: Application   Darrol Poke 10/05/2014, 3:15 PM

## 2014-10-05 NOTE — Progress Notes (Signed)
Subjective: 1 Day Post-Op Procedure(s) (LRB): TOTAL HIP ARTHROPLASTY ANTERIOR APPROACH (Right) Patient reports pain as moderate.  Direct anterior hip replacement performed late yesterday.  No acute changes overnight.  Vitals stable.  Labs not back.  Objective: Vital signs in last 24 hours: Temp:  [97.5 F (36.4 C)-100 F (37.8 C)] 97.9 F (36.6 C) (10/28 0558) Pulse Rate:  [77-92] 92 (10/28 0558) Resp:  [14-22] 20 (10/28 0558) BP: (105-149)/(54-93) 115/65 mmHg (10/28 0558) SpO2:  [90 %-96 %] 92 % (10/28 0558)  Intake/Output from previous day: 10/27 0701 - 10/28 0700 In: 1790 [P.O.:30; I.V.:1760] Out: 2030 [Urine:1580; Blood:450] Intake/Output this shift:     Recent Labs  10/03/14 1713 10/04/14 0454  HGB 14.3 14.0    Recent Labs  10/03/14 1713 10/04/14 0454  WBC 9.6 10.2  RBC 4.59 4.47  HCT 41.5 41.7  PLT 233 226    Recent Labs  10/03/14 1713 10/04/14 0454  NA 138 137  K 3.5* 3.7  CL 102 101  CO2 22 19  BUN 14 14  CREATININE 0.71 0.72  GLUCOSE 116* 135*  CALCIUM 9.4 9.2   No results found for this basename: LABPT, INR,  in the last 72 hours  Sensation intact distally Intact pulses distally Dorsiflexion/Plantar flexion intact Incision: dressing C/D/I  Assessment/Plan: 1 Day Post-Op Procedure(s) (LRB): TOTAL HIP ARTHROPLASTY ANTERIOR APPROACH (Right) Up with therapy; full weight bearing as tolerated right hip; no hip precautions. Aspirin 325 mg BID  Michaela Guerra Y 10/05/2014, 7:17 AM

## 2014-10-05 NOTE — Progress Notes (Signed)
Patient arrived to 4N32 from PACU at 2150.  Family was at bedside.

## 2014-10-05 NOTE — Progress Notes (Signed)
Occupational Therapy Evaluation Patient Details Name: Michaela Guerra MRN: [id number] DOB: [birth year] Today's Date: 10/05/2014    History of Present Illness Michaela Guerra is a 68 y.o. female s/p Rt THA direct anterior on 10/04/14 for a hip fx resulting from a fall. Pt presents with Rt wrist sprain (xray negative). 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   Clinical Impression   PTA pt lived at home alone and was independent with ADLs. Pt currently limited by pain in Rt hip and Rt wrist, which impair her independence with ADLs. Pt would benefit from ST rehab at SNF to address safety with ADLs and functional mobility. Pt will benefit from acute OT to address LB ADLS, bed mobility, and functional transfers.     Follow Up Recommendations  SNF;Supervision/Assistance - 24 hour    Equipment Recommendations  3 in 1 bedside comode    Recommendations for Other Services       Precautions / Restrictions Precautions Precautions: Fall Restrictions Weight Bearing Restrictions: No RLE Weight Bearing: Weight bearing as tolerated Other Position/Activity Restrictions: Rt wrist/forearm wrapped in ace wrap. RN getting RUE brace.       Mobility Bed Mobility Overal bed mobility: Needs Assistance Bed Mobility: Supine to Sit     Supine to sit: Mod assist;HOB elevated     General bed mobility comments: Pt required LUE hand held assist to elevate trunk off bed.  (A) required to advance RLE to EOB.   Transfers Overall transfer level: Needs assistance Equipment used: None;Right platform walker Transfers: Sit to/from [employer] Transfers Sit to Stand: Min assist Stand pivot transfers: Min assist       General transfer comment: Min (A) with platform RW (Right). Pt able to stand and put weight through walker to pivot to chair on her left with pivotal steps.     Balance Overall balance assessment: Needs assistance Sitting-balance support: Single extremity  supported;Feet supported Sitting balance-Leahy Scale: Poor Sitting balance - Comments: leans Lt to offload Rt hip   Standing balance support: Bilateral upper extremity supported;During functional activity Standing balance-Leahy Scale: Poor Standing balance comment: [place] on UE support on platform RW, however able to remain standing for perineal care.                             ADL Overall ADL's : Needs assistance/impaired Eating/Feeding: Independent;Sitting   Grooming: Set up;Sitting   Upper Body Bathing: Set up;Sitting   Lower Body Bathing: Sit to/from stand;Maximal assistance Lower Body Bathing Details (indicate cue type and reason): Pt required Bil UE support on RW in standing and required assist to wash perineal area Upper Body Dressing : Set up;Sitting   Lower Body Dressing: Maximal assistance;Sit to/from stand   Toilet Transfer: Minimal assistance;Stand-pivot (platform RW; from bed>recliner)   Toileting- Clothing Manipulation and Hygiene: Maximal assistance;Sit to/from stand Toileting - Clothing Manipulation Details (indicate cue type and reason): pt unable to remove UE from RW to wash perineal area             Vision  Pt reports no change from baseline.                    Perception Perception Perception Tested?: No   Praxis Praxis Praxis tested?: Within functional limits    Pertinent Vitals/Pain Pain Assessment: 0-10 Pain Score: 4  Pain Location: Rt wrist with use, Rt leg Pain Descriptors / Indicators: Aching;Sore Pain  Intervention(s): Limited activity within patient's tolerance;Monitored during session;Repositioned     Hand Dominance Left   Extremity/Trunk Assessment Upper Extremity Assessment Upper Extremity Assessment: RUE deficits/detail RUE Deficits / Details: wrapped in ace wrap; unable to tolerate use of RW in rt hand RUE: Unable to fully assess due to immobilization;Unable to fully assess due to pain RUE Coordination:  decreased fine motor       Cervical / Trunk Assessment Cervical / Trunk Assessment: Normal   Communication Communication Communication: No difficulties   Cognition Arousal/Alertness: Awake/alert Behavior During Therapy: WFL for tasks assessed/performed Overall Cognitive Status: Within Functional Limits for tasks assessed Area of Impairment: Safety/judgement         Safety/Judgement: Decreased awareness of safety;Decreased awareness of deficits     General Comments: Pt coming to realize limitations due to difficulty with OOB, however feels that she will make progress enough to go home alone tomorrow.               Home Living Family/patient expects to be discharged to:: Private residence (independent living) Living Arrangements: Alone Available Help at Discharge: Family;Available PRN/intermittently Type of Home: Independent living facility Home Access: Ramped entrance;Elevator     Home Layout: One level         Bathroom Toilet: Standard     Home Equipment: Grab bars - tub/shower   Additional Comments: states she has to walk a long way from entrance, to elevator, to her apartment      Prior Functioning/Environment Level of Independence: Independent        Comments: driving, cooking    OT Diagnosis: Generalized weakness;Acute pain   OT Problem List: Decreased strength;Decreased range of motion;Decreased activity tolerance;Impaired balance (sitting and/or standing);Decreased safety awareness;Decreased knowledge of use of DME or AE;Pain;Impaired UE functional use   OT Treatment/Interventions: Self-care/ADL training;Therapeutic exercise;Energy conservation;DME and/or AE instruction;Therapeutic activities;Patient/family education;Balance training    OT Goals(Current goals can be found in the care plan section) Acute Rehab OT Goals Patient Stated Goal: to get out of bed OT Goal Formulation: With patient Time For Goal Achievement: 10/19/14 Potential to Achieve  Goals: Good ADL Goals Pt Will Perform Lower Body Bathing: with min assist;sit to/from stand Pt Will Perform Lower Body Dressing: with min assist;sit to/from stand Pt Will Transfer to Toilet: with min guard assist;ambulating;bedside commode (platform RW) Pt Will Perform Toileting - Clothing Manipulation and hygiene: with min assist;sit to/from stand Additional ADL Goal #1: Pt will perform bed mobility with supervision to prepare for ADLs.   OT Frequency: Min 2X/week   Barriers to D/C: Decreased caregiver support             End of Session Equipment Utilized During Treatment: Gait belt;Other (comment) (plaftform RW (right)) Nurse Communication: Other (comment);Mobility status (pt sitting in recliner; platform RW in room)  Activity Tolerance: Patient tolerated treatment well Patient left: in chair;with call bell/phone within reach;with chair alarm set   Time: 1359-1434 OT Time Calculation (min): 35 min Charges:  OT General Charges $OT Visit: 1 Procedure OT Evaluation $Initial OT Evaluation Tier I: 1 Procedure OT Treatments $Self Care/Home Management : 23-37 mins  Juluis Rainier 10/05/2014, 3:00 PM  Cyndie Chime, OTR/L Occupational Therapist [phone] (pager)

## 2014-10-05 NOTE — Clinical Social Work Psychosocial (Signed)
Clinical Social Work Department BRIEF PSYCHOSOCIAL ASSESSMENT 10/05/2014  Patient:  Michaela, Guerra     Account Number:  [id number]     Admit date:  10/03/2014  Clinical Social Worker:  Marciano Sequin  Date/Time:  10/05/2014 06:15 PM  Referred by:  RN  Date Referred:  10/05/2014 Referred for  SNF Placement   Other Referral:   Interview type:  Patient Other interview type:    PSYCHOSOCIAL DATA Living Status:  FACILITY Admitted from facility:  [facility] Level of care:  Independent Living Primary support name:  Michaela Guerra Primary support relationship to patient:  CHILD, ADULT Degree of support available:   Strong System Support    CURRENT CONCERNS  Other Concerns:    SOCIAL WORK ASSESSMENT / PLAN CSW met pt and pt's daughter Michaela Guerra at bedside. CSW introduced self and purpose of visit. CSW, the pt and pt's daughter Michaela Guerra discussed the clinical team recommendations for rehab. The pt reported that she lives at [facility] [address] [place], [place] [address] [phone] (retirement living). The pt reported that she prefers to go back home, but do not know if she can receive PT/OT there. The pt reported understanding the recommendations and was open to the CSW explaining the SNF process. CSW, the pt and pt's daughter Michaela Guerra discussed the SNF list. After discussing the SNF rehab the pt reported being open to inpatient rehab. CSW will communicate the pt's wants with PT. CSW provided the family with contact information for further questions. CSW will continue to follow this pt and assist with discharge as needed.   Assessment/plan status:  Psychosocial Support/Ongoing Assessment of Needs Other assessment/ plan:   Information/referral to community resources:    PATIENT'S/FAMILY'S RESPONSE TO PLAN OF CARE: Both the pt and pt's  daughter were pleasent and nice. Both the pt and pt's daughter welcomed the clincal team's recommendations. Both the pt and the pt's daughter prefer  rehab at her retirement living home. Both the pt and the pt's daughter reported using SNF rehab as the last result.   [place], MSW, [place]

## 2014-10-05 NOTE — Clinical Social Work Placement (Addendum)
Clinical Social Work Department CLINICAL SOCIAL WORK PLACEMENT NOTE 10/05/2014  Patient:  Michaela Guerra, Michaela Guerra  Account Number:  [id number] Admit date:  10/03/2014  Clinical Social Worker:  Greta Doom, LCSWA  Date/time:  10/05/2014 06:31 PM  Clinical Social Work is seeking post-discharge placement for this patient at the following level of care:   SKILLED NURSING   (*CSW will update this form in Epic as items are completed)   10/05/2014  Patient/family provided with [place] Department of Clinical Social Work's list of facilities offering this level of care within the geographic area requested by the patient (or if unable, by the patient's family).  10/05/2014  Patient/family informed of their freedom to choose among providers that offer the needed level of care, that participate in Medicare, Medicaid or managed care program needed by the patient, have an available bed and are willing to accept the patient.  10/05/2014  Patient/family informed of [facility]' ownership interest in [facility], as well as of the fact that they are under no obligation to receive care at this facility.  PASARR submitted to EDS on 10/05/2014 PASARR number received on 10/05/2014  FL2 transmitted to all facilities in geographic area requested by pt/family on  10/05/2014 FL2 transmitted to all facilities within larger geographic area on 10/05/2014  Patient informed that his/her managed care company has contracts with or will negotiate with  certain facilities, including the following:     Patient/family informed of bed offers received:  10/06/2014 Patient chooses bed at [facility] Physician recommends and patient chooses bed at    Patient to be transferred to [facility] on 10/06/2014   Patient to be transferred to facility by [facility]  Patient and family notified of transfer on 10/06/2014 Name of family member notified:  Pt and pt's daughter Audelia Acton.   The following physician request were  entered in Epic:   Additional Comments:  [place], MSW, [place]

## 2014-10-05 NOTE — Op Note (Signed)
[name], Guerra                ACCOUNT NO.:  [id number]  MEDICAL RECORD NO.:  [id number]  LOCATION:  [id number]                        FACILITY:  [facility]  PHYSICIAN:  Lind Guest. Ninfa Linden, M.D.DATE OF BIRTH:  [birth year]  DATE OF PROCEDURE:  10/04/2014 DATE OF DISCHARGE:                              OPERATIVE REPORT   PREOPERATIVE DIAGNOSIS:  Right hip displaced femoral neck fracture.  POSTOPERATIVE DIAGNOSIS:  Right hip displaced femoral neck fracture.  PROCEDURE:  Right total hip arthroplasty through direct anterior approach.  IMPLANTS:  DePuy Sector Gription acetabular component size 50 with apex hole eliminator and a single screw, size 32+ 4 neutral polyethylene liner, size 10 Corail femoral component with standard offset, size 32+ 1 ceramic hip ball.  SURGEON:  Lind Guest. Ninfa Linden, M.D.  ASSISTANT:  Eduard Roux, MD  ANESTHESIA:  General.  ANTIBIOTICS:  2 g IV Ancef.  BLOOD LOSS:  450 mL.  COMPLICATIONS:  None.  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  She sustained an accidental mechanical fall yesterday and was seen in the [facility] and found to have a displaced femoral neck fracture.  Due to her pre- existing arthritis, it is recommended she undergo a total hip arthroplasty.  She was seen by other physicians who then referred to me for this to be done through an anterior approach.  She understands the risks of acute blood loss anemia, nerve and vessel injury, fracture, infection, dislocation, DVT.  She understands also that due to her health risk of COPD and this could be complicated from a general anesthesia standpoint.  However, her pain is significant due to the hip fracture, her mobility is going to be limited and her quality of life limited without this surgery being performed.  She gets informed consent.  I have talked to her family about this as well.  PROCEDURE DESCRIPTION:   After informed consent was obtained, appropriate right hip was marked.  She was brought to the operating room and general anesthesia was obtained while she was on her stretcher.  Foley catheter was already placed from the emergency room.  Traction boots were placed on both of her feet.  Next, she was placed supine on the Hana fracture table with the perineal post in place and both legs in inline skeletal traction devices, but no traction applied.  Her right operative hip was then assessed radiographically, it was prepped and draped with DuraPrep and sterile drapes.  Time-out was called and she was identified as correct patient, correct right hip.  I then made an incision inferior and posterior to the anterior-superior iliac spine and carried this obliquely down the leg.  I dissected down to the tensor fascia lata muscle.  The tensor fascia was then divided longitudinally, so we could proceed with a direct anterior approach to the hip.  A Cobra retractor was placed around the remnants of the lateral neck and up underneath the rectus femoris around the medial neck.  I cauterized the lateral femoral circumflex vessels.  I then opened up the hip capsule in a L-type format finding hematoma from her fracture and a femoral neck fracture.  Using an oscillating saw, I made my femoral neck cut just distal to her fracture and completed this with an osteotome.  I placed a corkscrew guide in the femoral head and removed the femoral head in its entirety. I then cleaned the acetabulum debris including the remnants of acetabular labrum.  I placed a Bent Hohmann medially and a Cobra retractor laterally.  I then began reaming from a size 42 in 2 mm increments up to a size 50 with all reamers under direct visualization, last reamer also under direct fluoroscopy, so I could obtain my depth of reaming, my inclination, and anteversion.  Once I was pleased with this, I placed a real DePuy Sector Gription  acetabular component with no problem.  I placed an apex hole eliminator guide and a single screw.  I then placed a real 32+ 4 neutral polyethylene liner for a size 50 acetabular component.  Attention was then turned to the femur.  With the leg externally rotated to 100 degrees, extended and adducted, I placed a Mueller retractor medially and a Hohmann retractor behind the greater trochanter.  I released the lateral joint capsule and then was able to bring the leg up.  I used a box cutting osteotome to enter the femoral canal and a rongeur to lateralize.  I then began broaching from a size 8 broach and a Corail broaching system up to a size 10.  With a size 10 and placed a trial standard neck and a 32+ 1 hip ball and reduced this in acetabulum, it was stable with good range of motion.  Her offsets and leg lengths were measured near equal under direct fluoroscopy.  I then dislocated the hip and removed the trial components.  I placed a real DePuy Corail femoral component size 10 with standard offset and a real 32+ 1 ceramic hip ball and reduced this in the acetabulum and again it was stable.  I verified radiographically and throughout its arc of motion.  I then copiously irrigated soft tissues with normal saline solution.  I closed the joint capsule with interrupted #1 Ethibond suture followed by running #1 Vicryl in the tensor fascia, 0 Vicryl in the deep tissue, 2-0 Vicryl in subcutaneous tissue, 4-0 Monocryl subcuticular stitch.  Steri-Strips on the skin and Aquacel dressing. She was taken off the Hana table and awakened, extubated, and taken to the recovery room in stable condition by watching her lungs closely due to her COPD.  Of note, Eduard Roux assisted during the entire case and his assistance for positioning was important for facilitating this case.     Lind Guest. Ninfa Linden, M.D.     CYB/MEDQ  D:  10/04/2014  T:  10/04/2014  Job:  [id number]

## 2014-10-05 NOTE — Progress Notes (Signed)
Patient Demographics  Michaela Guerra, is a 68 y.o. female, DOB - 04-30-1946, DJM:[id number]  Admit date - 10/03/2014   Admitting Physician Phillips Grout, MD  Outpatient Primary MD for the patient is London Pepper, MD  LOS - 2   Chief Complaint  Patient presents with  . Fall  . Hip Pain        Subjective:   Michaela Guerra today has, No headache, No chest pain, No abdominal pain - No Nausea, No new weakness tingling or numbness, No Cough - SOB. Mild ongoing right hip pain.  Assessment & Plan    1. Mechanical fall with closed displaced right femoral neck fracture. Post ORIF by orthopedics Dr Lorin Mercy on 10/04/2014, aspirin 325 twice a day for DVT prophylaxis and weightbearing as tolerated per orthopedics. Initiate PT. Question placement.    2. Hypothyroidism no acute issues. Continue home dose Synthroid.    3. History of COPD and smoking. Counseled to quit, no acute issues, no wheezing, no oxygen demand. Supportive care with normalizing treatments and oxygen as needed.    4. Essential hypertension. On Norvasc. As needed IV Lopressor ordered.      5. History of CVA. Now placed on 325 aspirin twice a day from 81 daily by orthopedics, continue statin for secondary prevention.    6. Dyslipidemia. On statin.    7. Low K. Replaced.    8. Right wrist sprain due to fall. In splint. No fracture.    9. Mild reactionary leukocytosis. Afebrile. Stable. Add incentive spirometry to avoid atelectasis. UA stable. Monitor.     Code Status: Full  Family Communication: None present  Disposition Plan: To be decided   Procedures ORIF right hip on 10/04/2014   Consults  Ortho   Medications  Scheduled Meds: . amLODipine  2.5 mg Oral Daily  . aspirin EC  325 mg Oral BID PC  .  cefTRIAXone (ROCEPHIN)  IV  1 g Intravenous Q24H  . docusate sodium  100 mg Oral BID  . Influenza vac split quadrivalent PF  0.5 mL Intramuscular Tomorrow-1000  . levothyroxine  25 mcg Oral QAC breakfast  . nicotine  21 mg Transdermal Daily  . simvastatin  10 mg Oral q1800   Continuous Infusions:   PRN Meds:.acetaminophen, alum & mag hydroxide-simeth, fentaNYL, HYDROcodone-acetaminophen, metoprolol, morphine injection, ondansetron (ZOFRAN) IV, polyethylene glycol  DVT Prophylaxis    SCDs    Lab Results  Component Value Date   PLT 185 10/05/2014    Antibiotics    Anti-infectives   Start     Dose/Rate Route Frequency Ordered Stop   10/05/14 1015  cefTRIAXone (ROCEPHIN) 1 g in dextrose 5 % 50 mL IVPB     1 g 100 mL/hr over 30 Minutes Intravenous Every 24 hours 10/05/14 1013 10/08/14 1014   10/05/14 0600  ceFAZolin (ANCEF) IVPB 2 g/50 mL premix  Status:  Discontinued     2 g 100 mL/hr over 30 Minutes Intravenous On call to O.R. 10/04/14 2203 10/05/14 1013   10/04/14 2330  ceFAZolin (ANCEF) IVPB 2 g/50 mL premix     2 g 100 mL/hr over 30 Minutes Intravenous Every 6 hours 10/04/14 2223 10/05/14 0543          Objective:  Filed Vitals:   10/05/14 0558 10/05/14 0752 10/05/14 0934 10/05/14 1010  BP: 115/65  108/58   Pulse: 92  95   Temp: 97.9 F (36.6 C)  98.4 F (36.9 C)   TempSrc: Oral  Oral   Resp: 20 16 19    Height:      SpO2: 92%  88% 92%    Wt Readings from Last 3 Encounters:  11/13/13 66.4 kg (146 lb 6.2 oz)  12/29/12 66.679 kg (147 lb)  07/26/10 68.72 kg (151 lb 8 oz)     Intake/Output Summary (Last 24 hours) at 10/05/14 1014 Last data filed at 10/05/14 0554  Gross per 24 hour  Intake   1790 ml  Output   2030 ml  Net   -240 ml     Physical Exam  Awake Alert, Oriented X 3, No new F.N deficits, Normal affect [place].AT,PERRAL Supple Neck,No JVD, No cervical lymphadenopathy appriciated.  Symmetrical Chest wall movement, Good air movement bilaterally,  CTAB RRR,No Gallops,Rubs or new Murmurs, No Parasternal Heave +ve B.Sounds, Abd Soft, No tenderness, No organomegaly appriciated, No rebound - guarding or rigidity. No Cyanosis, Clubbing or edema, No new Rash or bruise  , right wrist in splint   Data Review   Micro Results Recent Results (from the past 240 hour(s))  URINE CULTURE     Status: None   Collection Time    10/03/14  7:18 PM      Result Value Ref Range Status   Specimen Description URINE, CATHETERIZED   Final   Special Requests Normal   Final   Culture  Setup Time     Final   Value: 10/04/2014 01:09     Performed at [place]     Final   Value: 50,000 COLONIES/ML     Performed at [facility]   Culture     Final   Value: [place]     Performed at [facility]   Report Status PENDING   Incomplete  SURGICAL PCR SCREEN     Status: None   Collection Time    10/04/14  5:41 AM      Result Value Ref Range Status   MRSA, PCR NEGATIVE  NEGATIVE Final   Staphylococcus aureus NEGATIVE  NEGATIVE Final   Comment:            The Xpert SA Assay (FDA     approved for NASAL specimens     in patients over 63 years of age),     is one component of     a comprehensive surveillance     program.  Test performance has     been validated by [employer] for patients greater     than or equal to 50 year old.     It is not intended     to diagnose infection nor to     guide or monitor treatment.    Radiology Reports Dg Chest 1 View  10/03/2014   CLINICAL DATA:  Fall at home.  EXAM: CHEST - 1 VIEW  COMPARISON:  None.  FINDINGS: The heart size and mediastinal contours are within normal limits. Both lungs are clear. The visualized skeletal structures are unremarkable.  IMPRESSION: No acute cardiopulmonary abnormality seen.   Electronically Signed   By: Sabino Dick M.D.   On: 10/03/2014 18:10   Dg Wrist Complete Right  10/03/2014   CLINICAL DATA:  Fall.  Radial  sided wrist  pain.  Initial encounter  EXAM: RIGHT WRIST - COMPLETE 3+ VIEW  COMPARISON:  None.  FINDINGS: There is no evidence of fracture or dislocation.  There is mild degenerative narrowing of the radioscaphoid joint. Moderate degenerative spurring to the first [facility] joint.  IMPRESSION: No acute osseous findings.   Electronically Signed   By: Jorje Guild M.D.   On: 10/03/2014 18:08   Dg Hip Complete Right  10/03/2014   CLINICAL DATA:  Fall with right hip pain.  Initial encounter  EXAM: RIGHT HIP - COMPLETE 2+ VIEW  COMPARISON:  None.  FINDINGS: There is an acute fracture of the right femoral neck, basicervical. No intertrochanteric component is identified. There is mild impaction and varus angulation. The femoral head remains located.  No evidence of pelvic ring fracture or diastasis. The left hip is located and intact.  IMPRESSION: Displaced right femoral neck fracture.   Electronically Signed   By: Jorje Guild M.D.   On: 10/03/2014 18:09   Mm Diag Breast Tomo Bilateral  09/07/2014   CLINICAL DATA:  68 year old female with history of left breast cancer post lumpectomy in 2007 followed by radiation therapy.  EXAM: DIGITAL DIAGNOSTIC BILATERAL MAMMOGRAM WITH 3D TOMOSYNTHESIS AND CAD  COMPARISON:  Previous mammograms.  ACR Breast Density Category b: There are scattered areas of fibroglandular density.  FINDINGS: No suspicious masses or calcifications are seen in either breast. Postlumpectomy changes are present in the upper slightly inner left breast. A spot compression magnification tangential view of the lumpectomy site in the left breast was performed. There is no mammographic evidence of locally recurrent malignancy.  Mammographic images were processed with CAD.  IMPRESSION: No mammographic evidence of malignancy in either breast.  RECOMMENDATION: Screening mammogram in one year.(Code:[id number]-B-01Y)  I have discussed the findings and recommendations with the patient. Results were also provided in writing at the  conclusion of the visit. If applicable, a reminder letter will be sent to the patient regarding the next appointment.  BI-RADS CATEGORY  2: Benign.   Electronically Signed   By: Everlean Alstrom M.D.   On: 09/07/2014 14:23     CBC  Recent Labs Lab 10/03/14 1713 10/04/14 0454 10/05/14 0706  WBC 9.6 10.2 11.9*  HGB 14.3 14.0 11.7*  HCT 41.5 41.7 35.4*  PLT 233 226 185  MCV 90.4 93.3 94.1  MCH 31.2 31.3 31.1  MCHC 34.5 33.6 33.1  RDW 14.1 14.3 14.1  LYMPHSABS 1.8  --   --   MONOABS 0.7  --   --   EOSABS 0.1  --   --   BASOSABS 0.0  --   --     Chemistries   Recent Labs Lab 10/03/14 1713 10/04/14 0454 10/05/14 0706  NA 138 137 136*  K 3.5* 3.7 4.1  CL 102 101 101  CO2 22 19 24   GLUCOSE 116* 135* 175*  BUN 14 14 10   CREATININE 0.71 0.72 0.66  CALCIUM 9.4 9.2 8.7  AST 18  --   --   ALT 16  --   --   ALKPHOS 77  --   --   BILITOT 0.6  --   --    ------------------------------------------------------------------------------------------------------------------ CrCl is unknown because both a height and weight (above a minimum accepted value) are required for this calculation. ------------------------------------------------------------------------------------------------------------------ No results found for this basename: HGBA1C,  in the last 72 hours ------------------------------------------------------------------------------------------------------------------ No results found for this basename: CHOL, HDL, LDLCALC, TRIG, CHOLHDL, LDLDIRECT,  in the last 72 hours ------------------------------------------------------------------------------------------------------------------  No results found for this basename: TSH, T4TOTAL, FREET3, T3FREE, THYROIDAB,  in the last 72 hours ------------------------------------------------------------------------------------------------------------------ No results found for this basename: VITAMINB12, FOLATE, FERRITIN, TIBC, IRON,  RETICCTPCT,  in the last 72 hours  Coagulation profile No results found for this basename: INR, PROTIME,  in the last 168 hours  No results found for this basename: DDIMER,  in the last 72 hours  Cardiac Enzymes No results found for this basename: CK, CKMB, TROPONINI, MYOGLOBIN,  in the last 168 hours ------------------------------------------------------------------------------------------------------------------ No components found with this basename: POCBNP,      Time Spent in minutes   35   Tamar Lipscomb K M.D on 10/05/2014 at 10:14 AM  Between 7am to 7pm - Pager - [phone]  After 7pm go to [email or web address] - password [email or web address]  And look for the night coverage person covering for me after hours  [facility] Office  [phone]

## 2014-10-06 ENCOUNTER — Other Ambulatory Visit: Payer: Self-pay | Admitting: *Deleted

## 2014-10-06 LAB — CBC
HEMATOCRIT: 31.1 % — AB (ref 36.0–46.0)
Hemoglobin: 10.4 g/dL — ABNORMAL LOW (ref 12.0–15.0)
MCH: 31.2 pg (ref 26.0–34.0)
MCHC: 33.4 g/dL (ref 30.0–36.0)
MCV: 93.4 fL (ref 78.0–100.0)
Platelets: 184 10*3/uL (ref 150–400)
RBC: 3.33 MIL/uL — ABNORMAL LOW (ref 3.87–5.11)
RDW: 14.2 % (ref 11.5–15.5)
WBC: 9.6 10*3/uL (ref 4.0–10.5)

## 2014-10-06 LAB — BASIC METABOLIC PANEL
Anion gap: 13 (ref 5–15)
BUN: 12 mg/dL (ref 6–23)
CHLORIDE: 99 meq/L (ref 96–112)
CO2: 25 mEq/L (ref 19–32)
Calcium: 8.8 mg/dL (ref 8.4–10.5)
Creatinine, Ser: 0.68 mg/dL (ref 0.50–1.10)
GFR calc non Af Amer: 87 mL/min — ABNORMAL LOW (ref >90)
GLUCOSE: 135 mg/dL — AB (ref 70–99)
Potassium: 3.6 mEq/L — ABNORMAL LOW (ref 3.7–5.3)
Sodium: 137 mEq/L (ref 137–147)

## 2014-10-06 LAB — URINE CULTURE
Colony Count: 50000
SPECIAL REQUESTS: NORMAL

## 2014-10-06 MED ORDER — OXYCODONE-ACETAMINOPHEN 5-325 MG PO TABS: ORAL_TABLET | ORAL | Status: DC

## 2014-10-06 MED ORDER — POTASSIUM CHLORIDE CRYS ER 20 MEQ PO TBCR
40.0000 meq | EXTENDED_RELEASE_TABLET | Freq: Once | ORAL | Status: AC
  Administered 2014-10-06: 40 meq via ORAL
  Filled 2014-10-06: qty 2

## 2014-10-06 MED ORDER — POLYETHYLENE GLYCOL 3350 17 G PO PACK: 17.0000 g | PACK | Freq: Every day | ORAL | Status: DC | PRN

## 2014-10-06 NOTE — Discharge Summary (Signed)
Michaela Guerra, is a 68 y.o. female  DOB 02-11-1946  MRN [id number].  Admission date:  10/03/2014  Admitting Physician  Phillips Grout, MD  Discharge Date:  10/06/2014   Primary MD  London Pepper, MD  Recommendations for primary care physician for things to follow:   Check CBC, CMP and a 2 view chest x-ray in one week.   Admission Diagnosis  Irritable bowel syndrome [K58.9] Fall [W19.XXXA] Essential hypertension [I10] Fall, subsequent encounter [W19.XXXD] Closed right hip fracture, initial encounter [S72.001A] Hip fracture, unspecified laterality, sequela [S72.009S]   Discharge Diagnosis  Irritable bowel syndrome [K58.9] Fall [W19.XXXA] Essential hypertension [I10] Fall, subsequent encounter [W19.XXXD] Closed right hip fracture, initial encounter [S72.001A] Hip fracture, unspecified laterality, sequela [S72.009S]    Principal Problem:   Hip fracture Active Problems:   Hypothyroidism   Essential hypertension   IBS   h/o CVA   COPD (chronic obstructive pulmonary disease)      Past Medical History  Diagnosis Date  . Hypothyroidism   . Breast cancer     Adenocarcinoma  . Hypertension   . COPD (chronic obstructive pulmonary disease)   . Stroke 11/2013  . GERD (gastroesophageal reflux disease)     Past Surgical History  Procedure Laterality Date  . Breast surgery         History of present illness and  Hospital Course:     Kindly see H&P for history of present illness and admission details, please review complete Labs, Consult reports and Test reports for all details in brief  HPI  from the history and physical done on the day of admission  68 yo female h/o cva, copd, htn comes in after tripping off the side of a curb and hurting her right hip. No loc. No recent illnesses. She did have a cva in 2014  (which she does not believe was an actual stroke, but mri showed an infarct) with no residual demage and takes a baby aspirin a day for that. She lives alone and has no problems with her daily activities. No previous CAD.     Hospital Course   1. Mechanical fall with closed displaced right femoral neck fracture. Post ORIF by orthopedics Dr Lorin Mercy on 10/04/2014, aspirin 325 twice a day for DVT prophylaxis for 4 weeks and weightbearing as tolerated per orthopedics. Initiate PT. lives at independent living, will require SNF placement.    2. Hypothyroidism no acute issues. Continue home dose Synthroid.    3. History of COPD and smoking. Counseled to quit, no acute issues, no wheezing, no oxygen demand. Supportive care with as needed nebulizer treatments. No oxygen need.   4. Essential hypertension. On Norvasc and stable.   5. History of CVA. Now placed on 325 aspirin twice a day from 81 daily by orthopedics, after 4 weeks and once okay by orthopedics switch back to 81 mg of aspirin and currently continue statin for secondary prevention.    6. Dyslipidemia. On statin.    7. Low K. Replaced. Recheck in a few  days.   8. Right wrist sprain due to fall. In splint. No fracture.    9. Mild reactionary leukocytosis. Afebrile. Stable. UA stable. Resolved after adding incentive spirometry for atelectasis. Continue IS treatments 3-4 times an hour when awake at SNF.      Discharge Condition: stable   Follow UP  Follow-up Information   Follow up with Mcarthur Rossetti, MD. Schedule an appointment as soon as possible for a visit in 2 weeks.   Specialty:  Orthopedic Surgery   Contact information:   [place] [id number] [phone]       Follow up with London Pepper, MD. Schedule an appointment as soon as possible for a visit in 1 week.   Specialty:  Family Medicine   Contact information:   [place]  [address] [phone]         Discharge Instructions  and  Discharge Medications      Discharge Instructions   Diet - low sodium heart healthy    Complete by:  As directed      Discharge instructions    Complete by:  As directed   Can increase activities as comfort allows. Full weight bearing as tolerated; no hip precautions. Can get your current hip dressing wet in the shower daily. Do not remove your dressing at all until your outpatient orthopedic follow-up.   Follow with Primary MD London Pepper, MD or SNF MD in 7 days   Get CBC, CMP, 2 view Chest X ray checked  by Primary MD next visit.    Activity: As tolerated with Full fall precautions use walker/cane & assistance as needed   Disposition SNF   Diet: Heart Healthy    For Heart failure patients - Check your Weight same time everyday, if you gain over 2 pounds, or you develop in leg swelling, experience more shortness of breath or chest pain, call your Primary MD immediately. Follow Cardiac Low Salt Diet and 1.8 lit/day fluid restriction.   On your next visit with your primary care physician please Get Medicines reviewed and adjusted.   Please request your Prim.MD to go over all Hospital Tests and Procedure/Radiological results at the follow up, please get all Hospital records sent to your Prim MD by signing hospital release before you go home.   If you experience worsening of your admission symptoms, develop shortness of breath, life threatening emergency, suicidal or homicidal thoughts you must seek medical attention immediately by calling 911 or calling your MD immediately  if symptoms less severe.  You Must read complete instructions/literature along with all the possible adverse reactions/side effects for all the Medicines you take and that have been prescribed to you. Take any new Medicines after you have completely understood and accpet all the possible adverse reactions/side effects.   Do not drive, operating  heavy machinery, perform activities at heights, swimming or participation in water activities or provide baby sitting services if your were admitted for syncope or siezures until you have seen by Primary MD or a Neurologist and advised to do so again.  Do not drive when taking Pain medications.    Do not take more than prescribed Pain, Sleep and Anxiety Medications  Special Instructions: If you have smoked or chewed Tobacco  in the last 2 yrs please stop smoking, stop any regular Alcohol  and or any Recreational drug use.  Wear Seat belts while driving.   Please note  You were cared for by  a hospitalist during your hospital stay. If you have any questions about your discharge medications or the care you received while you were in the hospital after you are discharged, you can call the unit and asked to speak with the hospitalist on call if the hospitalist that took care of you is not available. Once you are discharged, your primary care physician will handle any further medical issues. Please note that NO REFILLS for any discharge medications will be authorized once you are discharged, as it is imperative that you return to your primary care physician (or establish a relationship with a primary care physician if you do not have one) for your aftercare needs so that they can reassess your need for medications and monitor your lab values.     Increase activity slowly    Complete by:  As directed             Medication List         amLODipine 2.5 MG tablet  Commonly known as:  NORVASC  Take 2.5 mg by mouth daily.     aspirin 325 MG EC tablet  Take 1 tablet (325 mg total) by mouth 2 (two) times daily after a meal.     levothyroxine 25 MCG tablet  Commonly known as:  SYNTHROID, LEVOTHROID  Take 1 tablet (25 mcg total) by mouth daily before breakfast.     LIVALO 1 MG Tabs  Generic drug:  Pitavastatin Calcium  Take 1 tablet by mouth daily.     oxyCODONE-acetaminophen 5-325 MG per  tablet  Commonly known as:  ROXICET  Take 1-2 tablets by mouth every 4 (four) hours as needed for severe pain.     polyethylene glycol packet  Commonly known as:  MIRALAX / GLYCOLAX  Take 17 g by mouth daily as needed for mild constipation.          Diet and Activity recommendation: See Discharge Instructions above   Consults obtained - Ortho   Major procedures and Radiology Reports - PLEASE review detailed and final reports for all details, in brief -       Dg Chest 1 View  10/03/2014   CLINICAL DATA:  Fall at home.  EXAM: CHEST - 1 VIEW  COMPARISON:  None.  FINDINGS: The heart size and mediastinal contours are within normal limits. Both lungs are clear. The visualized skeletal structures are unremarkable.  IMPRESSION: No acute cardiopulmonary abnormality seen.   Electronically Signed   By: Sabino Dick M.D.   On: 10/03/2014 18:10   Dg Wrist Complete Right  10/03/2014   CLINICAL DATA:  Fall.  Radial sided wrist pain.  Initial encounter  EXAM: RIGHT WRIST - COMPLETE 3+ VIEW  COMPARISON:  None.  FINDINGS: There is no evidence of fracture or dislocation.  There is mild degenerative narrowing of the radioscaphoid joint. Moderate degenerative spurring to the first [facility] joint.  IMPRESSION: No acute osseous findings.   Electronically Signed   By: Jorje Guild M.D.   On: 10/03/2014 18:08   Dg Hip Complete Right  10/03/2014   CLINICAL DATA:  Fall with right hip pain.  Initial encounter  EXAM: RIGHT HIP - COMPLETE 2+ VIEW  COMPARISON:  None.  FINDINGS: There is an acute fracture of the right femoral neck, basicervical. No intertrochanteric component is identified. There is mild impaction and varus angulation. The femoral head remains located.  No evidence of pelvic ring fracture or diastasis. The left hip is located and intact.  IMPRESSION: Displaced right  femoral neck fracture.   Electronically Signed   By: Jorje Guild M.D.   On: 10/03/2014 18:09   Dg Hip Operative  Right  10/04/2014   CLINICAL DATA:  Right hip fracture requiring operative repair.  EXAM: DG OPERATIVE right HIP 1-2 VIEWS  TECHNIQUE: Fluoroscopic spot image(s) were submitted for interpretation post-operatively.  COMPARISON:  Pelvis radiography from 1 day prior  FINDINGS: Two intraoperative fluoroscopic images show total right hip arthroplasty. There is no periprosthetic fracture or dislocation.  IMPRESSION: Total right hip arthroplasty.  No adverse findings.   Electronically Signed   By: Jorje Guild M.D.   On: 10/04/2014 20:22   Pelvis Portable  10/04/2014   CLINICAL DATA:  Postop right total hip.  EXAM: PORTABLE PELVIS 1-2 VIEWS  COMPARISON:  10/03/2014  FINDINGS: Postoperative change from right total hip arthroplasty noted. The hardware components are in anatomic alignment. No periprosthetic fracture or dislocation. Gas is identified within the soft tissues surrounding the proximal right femur and hip.  IMPRESSION: 1. Status post right total hip arthroplasty. 2. No complications.   Electronically Signed   By: Kerby Moors M.D.   On: 10/04/2014 21:30       Micro Results      Recent Results (from the past 240 hour(s))  URINE CULTURE     Status: None   Collection Time    10/03/14  7:18 PM      Result Value Ref Range Status   Specimen Description URINE, CATHETERIZED   Final   Special Requests Normal   Final   Culture  Setup Time     Final   Value: 10/04/2014 01:09     Performed at [place]     Final   Value: 50,000 COLONIES/ML     Performed at [facility]   Culture     Final   Value: [place]     Performed at [facility]   Report Status PENDING   Incomplete  SURGICAL PCR SCREEN     Status: None   Collection Time    10/04/14  5:41 AM      Result Value Ref Range Status   MRSA, PCR NEGATIVE  NEGATIVE Final   Staphylococcus aureus NEGATIVE  NEGATIVE Final   Comment:            The Xpert SA Assay (FDA     approved for  NASAL specimens     in patients over 60 years of age),     is one component of     a comprehensive surveillance     program.  Test performance has     been validated by [employer] for patients greater     than or equal to 69 year old.     It is not intended     to diagnose infection nor to     guide or monitor treatment.       Today   Subjective:   Zina Pitzer today has no headache,no chest abdominal pain,no new weakness tingling or numbness, feels much better.  Objective:   Blood pressure 119/62, pulse 78, temperature 97.9 F (36.6 C), temperature source Oral, resp. rate 20, height 5\' 5"  (1.651 m), SpO2 93.00%.   Intake/Output Summary (Last 24 hours) at 10/06/14 0948 Last data filed at 10/06/14 0500  Gross per 24 hour  Intake    120 ml  Output      0 ml  Net    120 ml    Exam Awake Alert, Oriented x 3, No new F.N deficits, Normal affect [place].AT,PERRAL Supple Neck,No JVD, No cervical lymphadenopathy appriciated.  Symmetrical Chest wall movement, Good air movement bilaterally, CTAB RRR,No Gallops,Rubs or new Murmurs, No Parasternal Heave +ve B.Sounds, Abd Soft, Non tender, No organomegaly appriciated, No rebound -guarding or rigidity. No Cyanosis, Clubbing or edema, No new Rash or bruise  Data Review   CBC w Diff: Lab Results  Component Value Date   WBC 9.6 10/06/2014   WBC 7.3 05/09/2011   HGB 10.4* 10/06/2014   HGB 13.7 05/09/2011   HCT 31.1* 10/06/2014   HCT 40.3 05/09/2011   PLT 184 10/06/2014   PLT 283 05/09/2011   LYMPHOPCT 19 10/03/2014   LYMPHOPCT 31.1 05/09/2011   MONOPCT 7 10/03/2014   MONOPCT 11.5 05/09/2011   EOSPCT 2 10/03/2014   EOSPCT 2.9 05/09/2011   BASOPCT 0 10/03/2014   BASOPCT 0.5 05/09/2011    CMP: Lab Results  Component Value Date   NA 137 10/06/2014   K 3.6* 10/06/2014   CL 99 10/06/2014   CO2 25 10/06/2014   BUN 12 10/06/2014   CREATININE 0.68 10/06/2014   PROT 6.5 10/03/2014   ALBUMIN 3.7 10/03/2014   BILITOT 0.6  10/03/2014   ALKPHOS 77 10/03/2014   AST 18 10/03/2014   ALT 16 10/03/2014  .   Total Time in preparing paper work, data evaluation and todays exam - 35 minutes  Thurnell Lose M.D on 10/06/2014 at 9:48 AM  [facility] Office  [phone]

## 2014-10-06 NOTE — Progress Notes (Addendum)
PT Cancellation Note  Patient Details Name: Michaela Guerra MRN: [id number] DOB: March 06, 1946   Cancelled Treatment:    Reason Eval/Treat Not Completed: Patient unavailable--beginning to bathe. Offered to assist her OOB for bathing, however she declined stating she was "I'm wet all over from the diuretics and really want to bathe first."   Will attempt to see prior to her d/c to SNF today.  1334-Second attempt. Pt working with nursing to prepare for d/c (dressing, taking out IV, etc). Ambulance to pick her up at 1400.   Michaela Guerra 10/06/2014, 11:14 AM Pager [phone]

## 2014-10-06 NOTE — Progress Notes (Signed)
Report given to Hoyle Sauer Medical laboratory scientific officer) in [place]. Assessments remains unchanged now. Awaiting on [facility] for transportation.

## 2014-10-06 NOTE — Discharge Instructions (Signed)
Can increase activities as comfort allows. Full weight bearing as tolerated; no hip precautions. Can get your current hip dressing wet in the shower daily. Do not remove your dressing at all until your outpatient orthopedic follow-up.   Follow with Primary MD London Pepper, MD or SNF MD in 7 days   Get CBC, CMP, 2 view Chest X ray checked  by Primary MD next visit.    Activity: As tolerated with Full fall precautions use walker/cane & assistance as needed   Disposition SNF   Diet: Heart Healthy    For Heart failure patients - Check your Weight same time everyday, if you gain over 2 pounds, or you develop in leg swelling, experience more shortness of breath or chest pain, call your Primary MD immediately. Follow Cardiac Low Salt Diet and 1.8 lit/day fluid restriction.   On your next visit with your primary care physician please Get Medicines reviewed and adjusted.   Please request your Prim.MD to go over all Hospital Tests and Procedure/Radiological results at the follow up, please get all Hospital records sent to your Prim MD by signing hospital release before you go home.   If you experience worsening of your admission symptoms, develop shortness of breath, life threatening emergency, suicidal or homicidal thoughts you must seek medical attention immediately by calling 911 or calling your MD immediately  if symptoms less severe.  You Must read complete instructions/literature along with all the possible adverse reactions/side effects for all the Medicines you take and that have been prescribed to you. Take any new Medicines after you have completely understood and accpet all the possible adverse reactions/side effects.   Do not drive, operating heavy machinery, perform activities at heights, swimming or participation in water activities or provide baby sitting services if your were admitted for syncope or siezures until you have seen by Primary MD or a Neurologist and advised to do  so again.  Do not drive when taking Pain medications.    Do not take more than prescribed Pain, Sleep and Anxiety Medications  Special Instructions: If you have smoked or chewed Tobacco  in the last 2 yrs please stop smoking, stop any regular Alcohol  and or any Recreational drug use.  Wear Seat belts while driving.   Please note  You were cared for by a hospitalist during your hospital stay. If you have any questions about your discharge medications or the care you received while you were in the hospital after you are discharged, you can call the unit and asked to speak with the hospitalist on call if the hospitalist that took care of you is not available. Once you are discharged, your primary care physician will handle any further medical issues. Please note that NO REFILLS for any discharge medications will be authorized once you are discharged, as it is imperative that you return to your primary care physician (or establish a relationship with a primary care physician if you do not have one) for your aftercare needs so that they can reassess your need for medications and monitor your lab values.

## 2014-10-06 NOTE — Telephone Encounter (Signed)
[facility] 

## 2014-10-07 ENCOUNTER — Encounter (HOSPITAL_COMMUNITY): Payer: Self-pay | Admitting: Orthopaedic Surgery

## 2014-10-07 ENCOUNTER — Non-Acute Institutional Stay (SKILLED_NURSING_FACILITY): Payer: Medicare Other | Admitting: Adult Health

## 2014-10-07 DIAGNOSIS — I10 Essential (primary) hypertension: Secondary | ICD-10-CM

## 2014-10-07 DIAGNOSIS — E785 Hyperlipidemia, unspecified: Secondary | ICD-10-CM

## 2014-10-07 DIAGNOSIS — E039 Hypothyroidism, unspecified: Secondary | ICD-10-CM

## 2014-10-07 DIAGNOSIS — S72001S Fracture of unspecified part of neck of right femur, sequela: Secondary | ICD-10-CM

## 2014-10-07 DIAGNOSIS — K5900 Constipation, unspecified: Secondary | ICD-10-CM

## 2014-10-10 ENCOUNTER — Other Ambulatory Visit: Payer: Self-pay | Admitting: *Deleted

## 2014-10-10 MED ORDER — OXYCODONE-ACETAMINOPHEN 5-325 MG PO TABS: ORAL_TABLET | ORAL | Status: DC

## 2014-10-10 NOTE — Telephone Encounter (Signed)
[facility] 

## 2014-10-11 ENCOUNTER — Non-Acute Institutional Stay (SKILLED_NURSING_FACILITY): Payer: Medicare Other | Admitting: Adult Health

## 2014-10-11 DIAGNOSIS — S62101A Fracture of unspecified carpal bone, right wrist, initial encounter for closed fracture: Secondary | ICD-10-CM

## 2014-10-12 ENCOUNTER — Encounter: Payer: Self-pay | Admitting: Adult Health

## 2014-10-12 ENCOUNTER — Non-Acute Institutional Stay (SKILLED_NURSING_FACILITY): Payer: Medicare Other | Admitting: Adult Health

## 2014-10-12 DIAGNOSIS — S72001A Fracture of unspecified part of neck of right femur, initial encounter for closed fracture: Secondary | ICD-10-CM | POA: Insufficient documentation

## 2014-10-12 DIAGNOSIS — K5900 Constipation, unspecified: Secondary | ICD-10-CM | POA: Insufficient documentation

## 2014-10-12 DIAGNOSIS — S62101S Fracture of unspecified carpal bone, right wrist, sequela: Secondary | ICD-10-CM

## 2014-10-12 DIAGNOSIS — E785 Hyperlipidemia, unspecified: Secondary | ICD-10-CM | POA: Insufficient documentation

## 2014-10-12 DIAGNOSIS — I10 Essential (primary) hypertension: Secondary | ICD-10-CM

## 2014-10-12 DIAGNOSIS — J449 Chronic obstructive pulmonary disease, unspecified: Secondary | ICD-10-CM

## 2014-10-12 DIAGNOSIS — S72001S Fracture of unspecified part of neck of right femur, sequela: Secondary | ICD-10-CM

## 2014-10-12 MED ORDER — SENNOSIDES-DOCUSATE SODIUM 8.6-50 MG PO TABS: 2.0000 | ORAL_TABLET | Freq: Every day | ORAL | Status: DC

## 2014-10-12 MED ORDER — DIPHENHYDRAMINE HCL 25 MG PO TABS: 25.0000 mg | ORAL_TABLET | ORAL | Status: DC | PRN

## 2014-10-12 MED ORDER — METHOCARBAMOL 500 MG PO TABS
500.0000 mg | ORAL_TABLET | Freq: Four times a day (QID) | ORAL | Status: DC | PRN
Start: 2014-10-12 — End: 2017-04-17

## 2014-10-12 NOTE — Progress Notes (Signed)
Patient ID: Michaela Guerra, female   DOB: 25-Jan-1946, 68 y.o.   MRN: [id number]     [facility]  Allergies  Allergen Reactions  . Codeine     REACTION: causes skin to feel like it's crawling  . Levofloxacin     REACTION: nausea     Chief Complaint  Patient presents with  . Hospitalization Follow-up    HPI:  She hsa been hospitalized after suffering a fall at home with a right femoral neck fracture. She had a right hip hemiarthroplasty and is here for short term rehab. She did hurt her right wrist as well and her x-rays at the hospital were negative. She is complaining of itching from her pain medication and states that the 2 percocet are to strong for her. She is also complaining of constipation.   Past Medical History  Diagnosis Date  . Hypothyroidism   . Breast cancer     Adenocarcinoma  . Hypertension   . COPD (chronic obstructive pulmonary disease)   . Stroke 11/2013  . GERD (gastroesophageal reflux disease)   . COPD 08/21/2007    Qualifier: Diagnosis of  By: Amil Amen MD, Benjamine Mola    . ALLERGIC RHINITIS 09/07/2009    Qualifier: Diagnosis of  By: Amil Amen MD, Benjamine Mola    . COLONIC POLYPS, HYPERPLASTIC 07/12/2009    Qualifier: Diagnosis of  By: Amil Amen MD, Benjamine Mola    . IBS 10/28/2008    Qualifier: Diagnosis of  By: Leilani Merl CMA, Tiffany    . DECREASED HEARING, LEFT EAR 05/03/2010    Qualifier: Diagnosis of  By: Asa Lente MD, Jannifer Rodney   . Arthritis   . Derangement of anterior horn of lateral meniscus 04/24/2009    Annotation: Underwent right knee arthroscopic surgery 09/20/09: For  chondromalacia and anterolateral meniscectomy Qualifier: Diagnosis of  By: Amil Amen MD, Benjamine Mola    . Hyperlipidemia   . URINARY INCONTINENCE 05/03/2010    Qualifier: Diagnosis of  By: Marca Ancona RMA, Lucy    . CARCINOMA, BREAST, HX OF 11/03/2007    Qualifier: Diagnosis of  By: Amil Amen MD, Benjamine Mola    . COLONIC POLYPS, HX OF 05/03/2010    Qualifier: Diagnosis of  By: Larose Kells       Past Surgical History  Procedure Laterality Date  . Breast surgery    . Total hip arthroplasty Right 10/04/2014    Procedure: TOTAL HIP ARTHROPLASTY ANTERIOR APPROACH;  Surgeon: Mcarthur Rossetti, MD;  Location: [facility];  Service: Orthopedics;  Laterality: Right;    VITAL SIGNS BP 138/78 mmHg  Pulse 70  Ht 5\' 5"  (1.651 m)  Wt 149 lb (67.586 kg)  BMI 24.79 kg/m2   Patient's Medications  New Prescriptions   No medications on file  Previous Medications   AMLODIPINE (NORVASC) 2.5 MG TABLET    Take 2.5 mg by mouth daily.   ASPIRIN EC 325 MG EC TABLET    Take 1 tablet (325 mg total) by mouth 2 (two) times daily after a meal.   LEVOTHYROXINE (SYNTHROID, LEVOTHROID) 25 MCG TABLET    Take 1 tablet (25 mcg total) by mouth daily before breakfast.   OXYCODONE-ACETAMINOPHEN (ROXICET) 5-325 MG PER TABLET    Take one tablet by mouth every 4hours as needed for pain   PITAVASTATIN CALCIUM (LIVALO) 1 MG TABS    Take 1 tablet by mouth daily.   POLYETHYLENE GLYCOL (MIRALAX / GLYCOLAX) PACKET    Take 17 g by mouth daily as needed for mild constipation.  Modified Medications  No medications on file  Discontinued Medications   No medications on file    SIGNIFICANT DIAGNOSTIC EXAMS  10-03-14: chest x-ray: No acute cardiopulmonary abnormality seen  10-03-14: right wrist x-ray; No acute osseous findings.    10-03-14: right hip x-ray: Displaced right femoral neck fracture.  10-04-14: pelvic x-ray: 1. Status post right total hip arthroplasty. 2. No complications.     LABS REVIEWED:  10-03-14: wbc 9.6; hgb 14.3; hct 41.5 ;mcv 90.4; plt 233; glucose 116; bun 14; creat 0.71; k+3.5; na++138; liver normal albumin 3.7 10-06-14: wbc 9.6; hgb 10.4; hct 31.1; mcv 93.4; plt 184 glucose 135; bun 12; creat 0.68; k+3.6; na++137     Review of Systems  Constitutional: Negative for malaise/fatigue.  Respiratory: Negative for cough and shortness of breath.   Cardiovascular: Negative for chest  pain, palpitations and leg swelling.  Gastrointestinal: Positive for constipation. Negative for heartburn and abdominal pain.  Musculoskeletal: Negative for myalgias and joint pain.       Pain medication is effective   Skin: Positive for itching.  Psychiatric/Behavioral: Negative for depression. The patient is not nervous/anxious.      Physical Exam  Constitutional: She is oriented to person, place, and time. She appears well-developed and well-nourished. No distress.  Neck: Neck supple. No JVD present.  Cardiovascular: Normal rate, regular rhythm and intact distal pulses.   Respiratory: Effort normal and breath sounds normal. No respiratory distress. She has no wheezes.  GI: Soft. Bowel sounds are normal. She exhibits no distension. There is no tenderness.  Musculoskeletal: She exhibits no edema.  Is able to move all extremities is status post right femoral neck fracture.  Has splint on right wrist Using walker with platform   Neurological: She is alert and oriented to person, place, and time.  Skin: Skin is warm and dry. She is not diaphoretic.  Incision line without signs of infection present.        ASSESSMENT/ PLAN:  1. Hypertension: will continue norvasc 2.5 mg daily and will monitor her status   2. Hypothyroidism: will continue synthroid 25 mcg daily and will monitor   3. Dyslipidemia: will continue livalo 1 mg daily   4. Constipation: will continue miralax daily as needed and will being senna s 2 tabs twice daily as needed   5. Right femoral neck fracture: will continue therapy as directed; will follow up with orthopedics as indicated. Will place her on benadryl 25 mg every 4 hours as needed and will continue her percocet 5.325 mg every 4 hours as needed for pain. Will start her on tums 500 mg tid for calcium supplement and vit d 2000 units daily; will begin robaxin 500 mg every 6 hours as needed and will monitor her status   Time spent with patient 50 minute.s

## 2014-10-17 DIAGNOSIS — S62101A Fracture of unspecified carpal bone, right wrist, initial encounter for closed fracture: Secondary | ICD-10-CM | POA: Insufficient documentation

## 2014-10-17 NOTE — Progress Notes (Signed)
Patient ID: Michaela Guerra, female   DOB: September 11, 1946, 68 y.o.   MRN: [id number]     Allergies  Allergen Reactions  . Codeine     REACTION: causes skin to feel like it's crawling  . Levofloxacin     REACTION: nausea       Chief Complaint  Patient presents with  . Acute Visit    follow up right wrist x-rays     HPI:  Therapy had reported that she has weakness and pain in her right wrist. She states her initial x-rays did not show any fracture. She is concerned that there may be a fracture present.   Past Medical History  Diagnosis Date  . Hypothyroidism   . Breast cancer     Adenocarcinoma  . Hypertension   . COPD (chronic obstructive pulmonary disease)   . Stroke 11/2013  . GERD (gastroesophageal reflux disease)   . COPD 08/21/2007    Qualifier: Diagnosis of  By: Amil Amen MD, Benjamine Mola    . ALLERGIC RHINITIS 09/07/2009    Qualifier: Diagnosis of  By: Amil Amen MD, Benjamine Mola    . COLONIC POLYPS, HYPERPLASTIC 07/12/2009    Qualifier: Diagnosis of  By: Amil Amen MD, Benjamine Mola    . IBS 10/28/2008    Qualifier: Diagnosis of  By: Leilani Merl CMA, Tiffany    . DECREASED HEARING, LEFT EAR 05/03/2010    Qualifier: Diagnosis of  By: Asa Lente MD, Jannifer Rodney   . Arthritis   . Derangement of anterior horn of lateral meniscus 04/24/2009    Annotation: Underwent right knee arthroscopic surgery 09/20/09: For  chondromalacia and anterolateral meniscectomy Qualifier: Diagnosis of  By: Amil Amen MD, Benjamine Mola    . Hyperlipidemia   . URINARY INCONTINENCE 05/03/2010    Qualifier: Diagnosis of  By: Marca Ancona RMA, Lucy    . CARCINOMA, BREAST, HX OF 11/03/2007    Qualifier: Diagnosis of  By: Amil Amen MD, Benjamine Mola    . COLONIC POLYPS, HX OF 05/03/2010    Qualifier: Diagnosis of  By: Larose Kells      Past Surgical History  Procedure Laterality Date  . Breast surgery    . Total hip arthroplasty Right 10/04/2014    Procedure: TOTAL HIP ARTHROPLASTY ANTERIOR APPROACH;  Surgeon: Mcarthur Rossetti,  MD;  Location: [facility];  Service: Orthopedics;  Laterality: Right;    VITAL SIGNS BP 119/80 mmHg  Pulse 79  Ht 5\' 5"  (1.651 m)  Wt 149 lb (67.586 kg)  BMI 24.79 kg/m2   Outpatient Encounter Prescriptions as of 10/11/2014  Medication Sig  . calcium carbonate (TUMS - DOSED IN MG ELEMENTAL CALCIUM) 500 MG chewable tablet Chew 1 tablet by mouth 3 (three) times daily with meals.  . cholecalciferol (VITAMIN D) 1000 UNITS tablet Take 2,000 Units by mouth daily.  Marland Kitchen amLODipine (NORVASC) 2.5 MG tablet Take 2.5 mg by mouth daily.  Marland Kitchen aspirin EC 325 MG EC tablet Take 1 tablet (325 mg total) by mouth 2 (two) times daily after a meal.  . diphenhydrAMINE (BENADRYL) 25 MG tablet Take 1 tablet (25 mg total) by mouth every 4 (four) hours as needed.  Marland Kitchen levothyroxine (SYNTHROID, LEVOTHROID) 25 MCG tablet Take 1 tablet (25 mcg total) by mouth daily before breakfast.  . methocarbamol (ROBAXIN) 500 MG tablet Take 1 tablet (500 mg total) by mouth every 6 (six) hours as needed for muscle spasms.  Marland Kitchen oxyCODONE-acetaminophen (ROXICET) 5-325 MG per tablet Take one tablet by mouth every 4hours as needed for pain  . Pitavastatin Calcium (LIVALO) 1  MG TABS Take 1 tablet by mouth daily.  . polyethylene glycol (MIRALAX / GLYCOLAX) packet Take 17 g by mouth daily as needed for mild constipation.  . senna-docusate (SENOKOT-S) 8.6-50 MG per tablet Take 2 tablets by mouth daily.     SIGNIFICANT DIAGNOSTIC EXAMS  10-03-14: chest x-ray: No acute cardiopulmonary abnormality seen  10-03-14: right wrist x-ray; No acute osseous findings.    10-03-14: right hip x-ray: Displaced right femoral neck fracture.  10-04-14: pelvic x-ray: 1. Status post right total hip arthroplasty. 2. No complications.  10-11-14: right hand x-ray: 1. No acute osseous abnormality evident. 2. chronic and degenerative findings. 3. correlate clinically for nonspecific soft tissue swelling.   10-11-14: right wrist x-ray: incompletely characterized  indeterminate chronicity posterior wrist region fracture injury.       LABS REVIEWED:  10-03-14: wbc 9.6; hgb 14.3; hct 41.5 ;mcv 90.4; plt 233; glucose 116; bun 14; creat 0.71; k+3.5; na++138; liver normal albumin 3.7 10-06-14: wbc 9.6; hgb 10.4; hct 31.1; mcv 93.4; plt 184 glucose 135; bun 12; creat 0.68; k+3.6; na++137      Review of Systems  Constitutional: Negative for malaise/fatigue.  Respiratory: Negative for cough and shortness of breath.   Cardiovascular: Negative for chest pain and palpitations.  Gastrointestinal: Negative for heartburn, abdominal pain and constipation.  Musculoskeletal:       Has right wrist weakness and pain present.   Skin: Negative.   Psychiatric/Behavioral: The patient is not nervous/anxious.      Physical Exam  Constitutional: She is oriented to person, place, and time. She appears well-developed and well-nourished. No distress.  Neck: Neck supple. No JVD present.  Cardiovascular: Normal rate and intact distal pulses.   Respiratory: Effort normal and breath sounds normal.  GI: Soft. She exhibits no distension. There is no tenderness.  Musculoskeletal:  Is able to move all extremities; is status post right femoral neck fracture.  Has mild right wrist weakness present; has mild swelling and fading bruising present   Neurological: She is alert and oriented to person, place, and time.  Skin: Skin is warm and dry. She is not diaphoretic.       ASSESSMENT/ PLAN:  1. Right wrist fracture: will setup an ortho consult will continue to have her wear her splint at this time and will monitor    Ok Edwards NP [facility]  Contact [phone] Monday through Friday 8am- 5pm  After hours call [phone]

## 2014-10-17 NOTE — Progress Notes (Signed)
Patient ID: Michaela Guerra, female   DOB: [birth year], 68 y.o.   MRN: [id number]    [facility]  Allergies  Allergen Reactions  . Codeine     REACTION: causes skin to feel like it's crawling  . Levofloxacin     REACTION: nausea       Chief Complaint  Patient presents with  . Discharge Note    HPI:  She is being discharged to home with home health for pt/ot. She will need a front wheel walker with a right arm platform. She will need her prescriptions to be written and will need a follow up with her pcp. An appointment has been made with Dr. Ninfa Linden on 10-17-14 for her right wrist fracture. She had been hospitalized for a right hip fracture.    Past Medical History  Diagnosis Date  . Hypothyroidism   . Breast cancer     Adenocarcinoma  . Hypertension   . COPD (chronic obstructive pulmonary disease)   . Stroke 11/2013  . GERD (gastroesophageal reflux disease)   . COPD 08/21/2007    Qualifier: Diagnosis of  By: Amil Amen MD, Benjamine Mola    . ALLERGIC RHINITIS 09/07/2009    Qualifier: Diagnosis of  By: Amil Amen MD, Benjamine Mola    . COLONIC POLYPS, HYPERPLASTIC 07/12/2009    Qualifier: Diagnosis of  By: Amil Amen MD, Benjamine Mola    . IBS 10/28/2008    Qualifier: Diagnosis of  By: Leilani Merl CMA, Tiffany    . DECREASED HEARING, LEFT EAR 05/03/2010    Qualifier: Diagnosis of  By: Asa Lente MD, Jannifer Rodney   . Arthritis   . Derangement of anterior horn of lateral meniscus 04/24/2009    Annotation: Underwent right knee arthroscopic surgery 09/20/09: For  chondromalacia and anterolateral meniscectomy Qualifier: Diagnosis of  By: Amil Amen MD, Benjamine Mola    . Hyperlipidemia   . URINARY INCONTINENCE 05/03/2010    Qualifier: Diagnosis of  By: Marca Ancona RMA, Lucy    . CARCINOMA, BREAST, HX OF 11/03/2007    Qualifier: Diagnosis of  By: Amil Amen MD, Benjamine Mola    . COLONIC POLYPS, HX OF 05/03/2010    Qualifier: Diagnosis of  By: Larose Kells      Past Surgical History  Procedure Laterality Date  .  Breast surgery    . Total hip arthroplasty Right 10/04/2014    Procedure: TOTAL HIP ARTHROPLASTY ANTERIOR APPROACH;  Surgeon: Mcarthur Rossetti, MD;  Location: [facility];  Service: Orthopedics;  Laterality: Right;    VITAL SIGNS BP 132/87 mmHg  Pulse 90  Ht 5\' 5"  (1.651 m)  Wt 149 lb (67.586 kg)  BMI 24.79 kg/m2  SpO2 95%   Outpatient Encounter Prescriptions as of 10/12/2014  Medication Sig  . amLODipine (NORVASC) 2.5 MG tablet Take 2.5 mg by mouth daily.  Marland Kitchen aspirin EC 325 MG EC tablet Take 1 tablet (325 mg total) by mouth 2 (two) times daily after a meal.  . calcium carbonate (TUMS - DOSED IN MG ELEMENTAL CALCIUM) 500 MG chewable tablet Chew 1 tablet by mouth 3 (three) times daily with meals.  . cholecalciferol (VITAMIN D) 1000 UNITS tablet Take 2,000 Units by mouth daily.  . diphenhydrAMINE (BENADRYL) 25 MG tablet Take 1 tablet (25 mg total) by mouth every 4 (four) hours as needed.  Marland Kitchen levothyroxine (SYNTHROID, LEVOTHROID) 25 MCG tablet Take 1 tablet (25 mcg total) by mouth daily before breakfast.  . methocarbamol (ROBAXIN) 500 MG tablet Take 1 tablet (500 mg total) by mouth every 6 (six) hours as needed  for muscle spasms.  Marland Kitchen oxyCODONE-acetaminophen (ROXICET) 5-325 MG per tablet Take one tablet by mouth every 4hours as needed for pain  . Pitavastatin Calcium (LIVALO) 1 MG TABS Take 1 tablet by mouth daily.  . polyethylene glycol (MIRALAX / GLYCOLAX) packet Take 17 g by mouth daily as needed for mild constipation.  . senna-docusate (SENOKOT-S) 8.6-50 MG per tablet Take 2 tablets by mouth daily.     SIGNIFICANT DIAGNOSTIC EXAMS   10-03-14: chest x-ray: No acute cardiopulmonary abnormality seen  10-03-14: right wrist x-ray; No acute osseous findings.    10-03-14: right hip x-ray: Displaced right femoral neck fracture.  10-04-14: pelvic x-ray: 1. Status post right total hip arthroplasty. 2. No complications.  10-11-14: right hand x-ray: 1. No acute osseous abnormality evident. 2.  chronic and degenerative findings. 3. correlate clinically for nonspecific soft tissue swelling.   10-11-14: right wrist x-ray: incompletely characterized indeterminate chronicity posterior wrist region fracture injury.       LABS REVIEWED:  10-03-14: wbc 9.6; hgb 14.3; hct 41.5 ;mcv 90.4; plt 233; glucose 116; bun 14; creat 0.71; k+3.5; na++138; liver normal albumin 3.7 10-06-14: wbc 9.6; hgb 10.4; hct 31.1; mcv 93.4; plt 184 glucose 135; bun 12; creat 0.68; k+3.6; na++137      ROS  Review of Systems  Constitutional: Negative for malaise/fatigue.  Respiratory: Negative for cough and shortness of breath.   Cardiovascular: Negative for chest pain and palpitations.  Gastrointestinal: Negative for heartburn, abdominal pain and constipation.  Musculoskeletal:       Has right wrist weakness and pain present.   Skin: Negative.   Psychiatric/Behavioral: The patient is not nervous/anxious.      Physical Exam   Physical Exam  Constitutional: She is oriented to person, place, and time. She appears well-developed and well-nourished. No distress.  Neck: Neck supple. No JVD present.  Cardiovascular: Normal rate and intact distal pulses.   Respiratory: Effort normal and breath sounds normal.  GI: Soft. She exhibits no distension. There is no tenderness.  Musculoskeletal:  Is able to move all extremities; is status post right femoral neck fracture.  Has mild right wrist weakness present; has mild swelling and fading bruising present   Neurological: She is alert and oriented to person, place, and time.  Skin: Skin is warm and dry. She is not diaphoretic.       ASSESSMENT/ PLAN:  Will discharge her to home with home health for pt/ot to improve upon her strength; gait and independence with adls. She will need a front wheel walker with a right arm platform in order to maintain her current level of independence with her adls. Her prescriptions have been written for a 30 day supply of  her medications with #30 percocet 5/325 mg tabs. She has a follow up scheduled with her pcp London Pepper on 10-21-14 at 2 pm.    Time spent with patient 40 minutes.     Ok Edwards NP [facility]  Contact [phone] Monday through Friday 8am- 5pm  After hours call [phone]

## 2015-05-01 ENCOUNTER — Telehealth: Payer: Self-pay | Admitting: *Deleted

## 2015-05-01 ENCOUNTER — Other Ambulatory Visit: Payer: Self-pay | Admitting: *Deleted

## 2015-05-01 ENCOUNTER — Telehealth: Payer: Self-pay | Admitting: Oncology

## 2015-05-01 DIAGNOSIS — C50919 Malignant neoplasm of unspecified site of unspecified female breast: Secondary | ICD-10-CM

## 2015-05-01 DIAGNOSIS — N63 Unspecified lump in unspecified breast: Secondary | ICD-10-CM

## 2015-05-01 NOTE — Telephone Encounter (Signed)
s.w. pt and advised she needs to have her records released from [facility] to [facility] and then she needs to call us back to sched appt with [facility]

## 2015-05-01 NOTE — Telephone Encounter (Signed)
Pt called to this RN to state " I felt a lump in my breast ".  Per discussion lump is above prior lumpectomy scar in left breast. She noticed it yesterday.  Per discussion noted per last mammogram area of concern was checked.  Plan will be to obtain a unilateral left diagnostic mammogram with U/S for evaluation of new concern.  Pt requested " could I go somewhere else then last time ?"  Request will be made for [facility].

## 2015-05-29 ENCOUNTER — Other Ambulatory Visit: Payer: Self-pay | Admitting: Oncology

## 2015-05-29 ENCOUNTER — Telehealth: Payer: Self-pay | Admitting: *Deleted

## 2015-05-29 NOTE — Telephone Encounter (Signed)
VM message from pt received @ 11:47 pm. TC back to patient and she is asking if Dr. Jana Hakim has received results of mammography and US done @ [facility] approx. 2 weeks ago. Please call patient when report available.

## 2015-06-14 ENCOUNTER — Encounter: Payer: Self-pay | Admitting: Neurology

## 2015-06-14 ENCOUNTER — Ambulatory Visit (INDEPENDENT_AMBULATORY_CARE_PROVIDER_SITE_OTHER): Payer: Medicare Other | Admitting: Neurology

## 2015-06-14 VITALS — BP 126/78 | HR 78 | Resp 18 | Ht 60.5 in | Wt 155.9 lb

## 2015-06-14 DIAGNOSIS — R413 Other amnesia: Secondary | ICD-10-CM

## 2015-06-14 DIAGNOSIS — I639 Cerebral infarction, unspecified: Secondary | ICD-10-CM

## 2015-06-14 DIAGNOSIS — Z853 Personal history of malignant neoplasm of breast: Secondary | ICD-10-CM

## 2015-06-14 DIAGNOSIS — H538 Other visual disturbances: Secondary | ICD-10-CM

## 2015-06-14 DIAGNOSIS — Z720 Tobacco use: Secondary | ICD-10-CM

## 2015-06-14 NOTE — Progress Notes (Signed)
NEUROLOGY CONSULTATION NOTE  Michaela Guerra MRN: [id number] DOB: 1946-02-24  Referring provider: Dr. Orland Mustard Primary care provider: Dr. Orland Mustard  Reason for consult:  History of stroke  HISTORY OF PRESENT ILLNESS: Michaela Guerra is a 69 year old left-handed woman with cerebrovascular disease, hypertension, hyperlipidemia, COPD, hypothyroidism, depression and history of breast cancer and Polio who presents for stroke.  Records, labs and MRI and MRA of head reviewed.  She had a stroke in December 2014, presenting with right arm and foot weakness.  She did not present to the ED until 5 days later, where MRI of the brain confirmed an acute infarct in the posterior limb of the internal capsule, along with chronic small vessel ischemic changes.  She was admitted for further workup.  MRA of the head showed mild intracranial atherosclerotic changes but no significant stenosis.  2D echo showed EF of 65-70% with grade 1 diastolic dysfunction.  Carotid doppler showed 1-39% bilateral ICA stenosis.  LDL was 135.  Hgb A1c was 6.2.  TSH was 18.155.  Telemetry showed 3 beta run of NSVT, which was likely secondary to low K, which was replaced.  She was discharged on ASA 81mg  and a statin.  She never followed up with neurology after discharge.    For the past two months, she hasn't been feeling well.  She reports feeling anxious and chest discomfort.  Her thyroid wasn't controlled and her synthroid was increased.  Symptoms have somewhat improved.  However, she still reports blurred vision and memory problems, specifically short-term memory.  She denies headache.  She reports that her left hand shakes when she writes, but that has been ongoing for two years.  Hgb A1c from last month was 6.1.  PAST MEDICAL HISTORY: Past Medical History  Diagnosis Date  . Hypothyroidism   . Breast cancer     Adenocarcinoma  . Hypertension   . COPD (chronic obstructive pulmonary disease)   . Stroke 11/2013  . GERD  (gastroesophageal reflux disease)   . COPD 08/21/2007    Qualifier: Diagnosis of  By: Amil Amen MD, Benjamine Mola    . ALLERGIC RHINITIS 09/07/2009    Qualifier: Diagnosis of  By: Amil Amen MD, Benjamine Mola    . COLONIC POLYPS, HYPERPLASTIC 07/12/2009    Qualifier: Diagnosis of  By: Amil Amen MD, Benjamine Mola    . IBS 10/28/2008    Qualifier: Diagnosis of  By: Leilani Merl CMA, Tiffany    . DECREASED HEARING, LEFT EAR 05/03/2010    Qualifier: Diagnosis of  By: Asa Lente MD, Jannifer Rodney   . Arthritis   . Derangement of anterior horn of lateral meniscus 04/24/2009    Annotation: Underwent right knee arthroscopic surgery 09/20/09: For  chondromalacia and anterolateral meniscectomy Qualifier: Diagnosis of  By: Amil Amen MD, Benjamine Mola    . Hyperlipidemia   . URINARY INCONTINENCE 05/03/2010    Qualifier: Diagnosis of  By: Marca Ancona RMA, Lucy    . CARCINOMA, BREAST, HX OF 11/03/2007    Qualifier: Diagnosis of  By: Amil Amen MD, Benjamine Mola    . COLONIC POLYPS, HX OF 05/03/2010    Qualifier: Diagnosis of  By: Marca Ancona RMA, Lucy      PAST SURGICAL HISTORY: Past Surgical History  Procedure Laterality Date  . Breast surgery    . Total hip arthroplasty Right 10/04/2014    Procedure: TOTAL HIP ARTHROPLASTY ANTERIOR APPROACH;  Surgeon: Mcarthur Rossetti, MD;  Location: [facility];  Service: Orthopedics;  Laterality: Right;    MEDICATIONS: Current Outpatient Prescriptions on File Prior to Visit  Medication  Sig Dispense Refill  . amLODipine (NORVASC) 2.5 MG tablet Take 2.5 mg by mouth daily.    Marland Kitchen aspirin EC 325 MG EC tablet Take 1 tablet (325 mg total) by mouth 2 (two) times daily after a meal. 30 tablet 0  . calcium carbonate (TUMS - DOSED IN MG ELEMENTAL CALCIUM) 500 MG chewable tablet Chew 1 tablet by mouth 3 (three) times daily with meals.    . Pitavastatin Calcium (LIVALO) 1 MG TABS Take 1 tablet by mouth daily.    . cholecalciferol (VITAMIN D) 1000 UNITS tablet Take 2,000 Units by mouth daily.    . diphenhydrAMINE (BENADRYL) 25  MG tablet Take 1 tablet (25 mg total) by mouth every 4 (four) hours as needed. (Patient not taking: Reported on 06/14/2015) 30 tablet 0  . levothyroxine (SYNTHROID, LEVOTHROID) 25 MCG tablet Take 1 tablet (25 mcg total) by mouth daily before breakfast. (Patient not taking: Reported on 06/14/2015) 30 tablet 2  . methocarbamol (ROBAXIN) 500 MG tablet Take 1 tablet (500 mg total) by mouth every 6 (six) hours as needed for muscle spasms. (Patient not taking: Reported on 06/14/2015) 120 tablet 11  . oxyCODONE-acetaminophen (ROXICET) 5-325 MG per tablet Take one tablet by mouth every 4hours as needed for pain (Patient not taking: Reported on 06/14/2015) 180 tablet 0  . polyethylene glycol (MIRALAX / GLYCOLAX) packet Take 17 g by mouth daily as needed for mild constipation. (Patient not taking: Reported on 06/14/2015) 14 each 0  . senna-docusate (SENOKOT-S) 8.6-50 MG per tablet Take 2 tablets by mouth daily. (Patient not taking: Reported on 06/14/2015) 60 tablet 11   No current facility-administered medications on file prior to visit.    ALLERGIES: Allergies  Allergen Reactions  . Codeine     REACTION: causes skin to feel like it's crawling  . Levofloxacin     REACTION: nausea    FAMILY HISTORY: Family History  Problem Relation Age of Onset  . Breast cancer Mother   . Stroke Mother   . Stroke Father   . Breast cancer Sister   . Hypertension Sister   . Hypertension Sister   . Dementia Sister     SOCIAL HISTORY: History   Social History  . Marital Status: Single    Spouse Name: N/A  . Number of Children: N/A  . Years of Education: N/A   Occupational History  . Retired    Social History Main Topics  . Smoking status: Current Every Day Smoker -- 1.00 packs/day for 40 years  . Smokeless tobacco: Not on file     Comment: Has cut back to 1 ppd she is aware she needs to quit   . Alcohol Use: 0.0 oz/week    0 Standard drinks or equivalent per week     Comment: once every two weeks  . Drug Use:  No  . Sexual Activity: No   Other Topics Concern  . Not on file   Social History Narrative   Lives alone.    REVIEW OF SYSTEMS: Constitutional: No fevers, chills, or sweats, no generalized fatigue, change in appetite Eyes: No visual changes, double vision, eye pain Ear, nose and throat: No hearing loss, ear pain, nasal congestion, sore throat Cardiovascular: No chest pain, palpitations Respiratory:  No shortness of breath at rest or with exertion, wheezes GastrointestinaI: No nausea, vomiting, diarrhea, abdominal pain, fecal incontinence Genitourinary:  No dysuria, urinary retention or frequency Musculoskeletal:  No neck pain, back pain Integumentary: No rash, pruritus, skin lesions Neurological: as above Psychiatric:  No depression, insomnia, anxiety Endocrine: No palpitations, fatigue, diaphoresis, mood swings, change in appetite, change in weight, increased thirst Hematologic/Lymphatic:  No anemia, purpura, petechiae. Allergic/Immunologic: no itchy/runny eyes, nasal congestion, recent allergic reactions, rashes  PHYSICAL EXAM: Filed Vitals:   06/14/15 0953  BP: 126/78  Pulse: 78  Resp: 18   General: No acute distress Head:  Normocephalic/atraumatic Eyes:  fundi unremarkable, without vessel changes, exudates, hemorrhages or papilledema. Neck: supple, no paraspinal tenderness, full range of motion Back: No paraspinal tenderness Heart: regular rate and rhythm Lungs: Clear to auscultation bilaterally. Vascular: No carotid bruits. Neurological Exam: Mental status: alert and oriented to person, place, and time, recent and remote memory intact, fund of knowledge intact, attention and concentration intact, speech fluent and not dysarthric, language intact. Cranial nerves: CN I: not tested CN II: pupils equal, round and reactive to light, visual fields intact, fundi unremarkable, without vessel changes, exudates, hemorrhages or papilledema. CN III, IV, VI:  full range of  motion, no nystagmus, no ptosis CN V: facial sensation intact CN VII: upper and lower face symmetric CN VIII: hearing intact CN IX, X: gag intact, uvula midline CN XI: sternocleidomastoid and trapezius muscles intact CN XII: tongue midline Bulk & Tone: normal, no fasciculations. Motor:  5/5 throughout Sensation:  Mildly reduced pinprick sensation in left hand.  Otherwise, pinprick and vibration intact. Deep Tendon Reflexes:  2+ in upper extremities, absent in lower extremities, toes withdraw Finger to nose testing:  intact Heel to shin:  intact Gait:  Slight right limp (right hip replacement).  Able to tandem walk. Romberg positive.  IMPRESSION: History of left internal capsular lacunar infarct, likely secondary to small vessel disease. Blurred vision Short-term memory problems History of breast cancer Tobacco abuse  PLAN: Continue ASA 81mg  daily She says her cholesterol will be rechecked in 2 days.  LDL goal should be less than 100. Continue blood pressure control Smoking cessation Since she has history of breast cancer and stroke, and she endorses new blurred vision and memory problems, we will get MRI of brain with and without contrast. She would like to see an endocrinologist.  I recommended that she ask Dr. Orland Mustard to refer her to [facility] Endocrinology. Follow up for memory assessment.  Thank you for allowing me to take part in the care of this patient.  Metta Clines, DO  CC:  London Pepper, MD

## 2015-06-14 NOTE — Patient Instructions (Addendum)
1.  Continue aspirin and statin daily 2.  Will get MRI of brain with and without contrast [facility]  06/22/15 11:45 am 3.  Follow up for memory testing 4.  Have Dr. Orland Mustard consider referral to [facility] Endocrinology

## 2015-06-19 ENCOUNTER — Encounter: Payer: Self-pay | Admitting: Neurology

## 2015-06-19 ENCOUNTER — Other Ambulatory Visit: Payer: Medicaid Other

## 2015-06-19 ENCOUNTER — Ambulatory Visit (INDEPENDENT_AMBULATORY_CARE_PROVIDER_SITE_OTHER): Payer: Medicare Other | Admitting: Neurology

## 2015-06-19 VITALS — BP 132/66 | HR 70 | Resp 16 | Ht 65.0 in | Wt 156.7 lb

## 2015-06-19 DIAGNOSIS — R413 Other amnesia: Secondary | ICD-10-CM | POA: Insufficient documentation

## 2015-06-19 NOTE — Patient Instructions (Signed)
I don't suspect any evidence of dementia.  I would like to check a B12.  Follow up as scheduled.

## 2015-06-19 NOTE — Progress Notes (Signed)
NEUROLOGY FOLLOW UP OFFICE NOTE  Michaela Guerra [id number]  HISTORY OF PRESENT ILLNESS: Michaela Guerra is a 69 year old left-handed woman with cerebrovascular disease, hypertension, hyperlipidemia, COPD, hypothyroidism, depression and history of stroke, breast cancer and Polio who presents for memory problems.  She reports problems with memory "all of my life".  Specifically, she reports occasionally misplacing her keys or unable to find her car in the parking lot after leaving a store.  She denies problems with recognizing names and faces of people she knows.  She denies disorientation while driving.  She is able to perform all of her ADLs and manages her finances without difficulty.  She notes difficulty focusing and paying attention when she is reading.  She does report some anxiety.    She denies family history of dementia.  PAST MEDICAL HISTORY: Past Medical History  Diagnosis Date  . Hypothyroidism   . Breast cancer     Adenocarcinoma  . Hypertension   . COPD (chronic obstructive pulmonary disease)   . Stroke 11/2013  . GERD (gastroesophageal reflux disease)   . COPD 08/21/2007    Qualifier: Diagnosis of  By: Amil Amen MD, Benjamine Mola    . ALLERGIC RHINITIS 09/07/2009    Qualifier: Diagnosis of  By: Amil Amen MD, Benjamine Mola    . COLONIC POLYPS, HYPERPLASTIC 07/12/2009    Qualifier: Diagnosis of  By: Amil Amen MD, Benjamine Mola    . IBS 10/28/2008    Qualifier: Diagnosis of  By: Leilani Merl CMA, Tiffany    . DECREASED HEARING, LEFT EAR 05/03/2010    Qualifier: Diagnosis of  By: Asa Lente MD, Jannifer Rodney   . Arthritis   . Derangement of anterior horn of lateral meniscus 04/24/2009    Annotation: Underwent right knee arthroscopic surgery 09/20/09: For  chondromalacia and anterolateral meniscectomy Qualifier: Diagnosis of  By: Amil Amen MD, Benjamine Mola    . Hyperlipidemia   . URINARY INCONTINENCE 05/03/2010    Qualifier: Diagnosis of  By: Marca Ancona RMA, Lucy    . CARCINOMA, BREAST, HX OF 11/03/2007   Qualifier: Diagnosis of  By: Amil Amen MD, Benjamine Mola    . COLONIC POLYPS, HX OF 05/03/2010    Qualifier: Diagnosis of  By: Larose Kells      MEDICATIONS: Current Outpatient Prescriptions on File Prior to Visit  Medication Sig Dispense Refill  . Pitavastatin Calcium (LIVALO) 1 MG TABS Take 1 tablet by mouth daily.    Marland Kitchen amLODipine (NORVASC) 2.5 MG tablet Take 2.5 mg by mouth daily.    Marland Kitchen aspirin EC 325 MG EC tablet Take 1 tablet (325 mg total) by mouth 2 (two) times daily after a meal. 30 tablet 0  . calcium carbonate (TUMS - DOSED IN MG ELEMENTAL CALCIUM) 500 MG chewable tablet Chew 1 tablet by mouth 3 (three) times daily with meals.    . cholecalciferol (VITAMIN D) 1000 UNITS tablet Take 2,000 Units by mouth daily.    . diphenhydrAMINE (BENADRYL) 25 MG tablet Take 1 tablet (25 mg total) by mouth every 4 (four) hours as needed. (Patient not taking: Reported on 06/14/2015) 30 tablet 0  . escitalopram (LEXAPRO) 5 MG tablet 1/2 tablet daily for 1 week then increase to 1 tablet daily Once a day Orally  0  . levothyroxine (SYNTHROID, LEVOTHROID) 25 MCG tablet Take 1 tablet (25 mcg total) by mouth daily before breakfast. (Patient not taking: Reported on 06/14/2015) 30 tablet 2  . levothyroxine (SYNTHROID, LEVOTHROID) 50 MCG tablet 1 tablet Once a day Orally 30 day(s)  1  . methocarbamol (  ROBAXIN) 500 MG tablet Take 1 tablet (500 mg total) by mouth every 6 (six) hours as needed for muscle spasms. (Patient not taking: Reported on 06/14/2015) 120 tablet 11  . oxyCODONE-acetaminophen (ROXICET) 5-325 MG per tablet Take one tablet by mouth every 4hours as needed for pain (Patient not taking: Reported on 06/14/2015) 180 tablet 0  . polyethylene glycol (MIRALAX / GLYCOLAX) packet Take 17 g by mouth daily as needed for mild constipation. (Patient not taking: Reported on 06/14/2015) 14 each 0  . senna-docusate (SENOKOT-S) 8.6-50 MG per tablet Take 2 tablets by mouth daily. (Patient not taking: Reported on 06/14/2015) 60  tablet 11   No current facility-administered medications on file prior to visit.    ALLERGIES: Allergies  Allergen Reactions  . Codeine     REACTION: causes skin to feel like it's crawling  . Levofloxacin     REACTION: nausea    FAMILY HISTORY: Family History  Problem Relation Age of Onset  . Breast cancer Mother   . Stroke Mother   . Stroke Father   . Breast cancer Sister   . Hypertension Sister   . Hypertension Sister   . Dementia Sister     SOCIAL HISTORY: History   Social History  . Marital Status: Single    Spouse Name: N/A  . Number of Children: N/A  . Years of Education: N/A   Occupational History  . Retired    Social History Main Topics  . Smoking status: Current Every Day Smoker -- 1.00 packs/day for 40 years  . Smokeless tobacco: Not on file     Comment: Has cut back to 1 ppd she is aware she needs to quit   . Alcohol Use: 0.0 oz/week    0 Standard drinks or equivalent per week     Comment: once every two weeks  . Drug Use: No  . Sexual Activity: No   Other Topics Concern  . Not on file   Social History Narrative   Lives alone.    REVIEW OF SYSTEMS: Constitutional: No fevers, chills, or sweats, no generalized fatigue, change in appetite Eyes: No visual changes, double vision, eye pain Ear, nose and throat: No hearing loss, ear pain, nasal congestion, sore throat Cardiovascular: No chest pain, palpitations Respiratory:  No shortness of breath at rest or with exertion, wheezes GastrointestinaI: No nausea, vomiting, diarrhea, abdominal pain, fecal incontinence Genitourinary:  No dysuria, urinary retention or frequency Musculoskeletal:  No neck pain, back pain Integumentary: No rash, pruritus, skin lesions Neurological: as above Psychiatric: anxiety Endocrine: No palpitations, fatigue, diaphoresis, mood swings, change in appetite, change in weight, increased thirst Hematologic/Lymphatic:  No anemia, purpura, petechiae. Allergic/Immunologic:  no itchy/runny eyes, nasal congestion, recent allergic reactions, rashes  PHYSICAL EXAM: Filed Vitals:   06/19/15 1336  BP: 132/66  Pulse: 70  Resp: 16   General: No acute distress.  Patient appears well-groomed.  normal body habitus. Head:  Normocephalic/atraumatic Eyes:  Fundoscopic exam unremarkable without vessel changes, exudates, hemorrhages or papilledema. Neck: supple, no paraspinal tenderness, full range of motion Heart:  Regular rate and rhythm Lungs:  Clear to auscultation bilaterally Back: No paraspinal tenderness Neurological Exam: alert and oriented to person, place, and time. Attention span and concentration intact, recent and remote memory intact, fund of knowledge intact.  Speech fluent and not dysarthric, language intact.  Montreal Cognitive Assessment  06/19/2015  Visuospatial/ Executive (0/5) 5  Naming (0/3) 3  Attention: Read list of digits (0/2) 2  Attention: Read list of letters (  0/1) 1  Attention: Serial 7 subtraction starting at 100 (0/3) 3  Language: Repeat phrase (0/2) 2  Language : Fluency (0/1) 0  Abstraction (0/2) 1  Delayed Recall (0/5) 3  Orientation (0/6) 6  Total 26  Adjusted Score (based on education) 26   IMPRESSION: Memory deficits.  I do not appreciate any cognitive impairment on exam or based on history.  PLAN: Monitor Check B12 level MRI of brain is pending Follow up after testing  26 minutes spent face to face with patient, 100% spent discussing diagnosis and management.  Metta Clines, DO  CC:  London Pepper, MD

## 2015-06-20 ENCOUNTER — Encounter: Payer: Self-pay | Admitting: Genetic Counselor

## 2015-06-20 ENCOUNTER — Telehealth: Payer: Self-pay | Admitting: *Deleted

## 2015-06-20 LAB — VITAMIN B12: VITAMIN B 12: 464 pg/mL (ref 211–911)

## 2015-06-20 NOTE — Telephone Encounter (Signed)
-----   Message from Pieter Partridge, DO sent at 06/20/2015  7:11 AM EDT ----- b12 normal

## 2015-06-20 NOTE — Telephone Encounter (Signed)
Patient is aware b12 normal

## 2015-06-21 ENCOUNTER — Ambulatory Visit (INDEPENDENT_AMBULATORY_CARE_PROVIDER_SITE_OTHER): Payer: Medicare Other | Admitting: Cardiology

## 2015-06-21 ENCOUNTER — Encounter: Payer: Self-pay | Admitting: Cardiology

## 2015-06-21 VITALS — BP 138/80 | HR 81 | Ht 65.0 in | Wt 157.5 lb

## 2015-06-21 DIAGNOSIS — I10 Essential (primary) hypertension: Secondary | ICD-10-CM

## 2015-06-21 NOTE — Progress Notes (Signed)
Cardiology Office Note   Date:  06/21/2015   ID:  Michaela Guerra, DOB 05-Aug-1946, MRN [id number]  PCP:  Michaela Pepper, MD  Cardiologist:   Minus Breeding, MD   No chief complaint on file.     History of Present Illness: Michaela Guerra is a 69 y.o. female who presents for evaluation of chest discomfort. She has no history of coronary disease though I did review a catheterization that she had in 2007. She had very minimal plaque at that time. She had an echocardiogram in 2014 and I also reviewed that was essentially normal. She reports an episode of chest discomfort about a week ago. It actually started as lower abdominal discomfort. She has severe fullness in her abdomen. This subsequently radiated up to her chest. She's not had symptoms like this before. She's had a couple of more subtle episodes since then. She's not describingneck discomfort..  She's not describing arm discomfort.  She's had occasional palpitations but no presyncope or syncope. She's had problems with her thyroid being regulated and said that it has fluctuated and she is due to see an endocrinologist in a few days. She's also being treated for depression. She was started on Lexapro and says she's only been taking this for about a week. She does her activities which includes vacuuming. With this she denies any cardiovascular symptoms. She cannot bring on any chest discomfort neck or arm discomfort. She does get dyspneic with some exertion but this is stable. She does not have PND or orthopnea.   Past Medical History  Diagnosis Date  . Hypothyroidism   . Breast cancer     Adenocarcinoma  . Hypertension   . COPD (chronic obstructive pulmonary disease)   . Stroke 11/2013  . GERD (gastroesophageal reflux disease)   . COPD 08/21/2007    Qualifier: Diagnosis of  By: Amil Amen MD, Benjamine Mola    . ALLERGIC RHINITIS 09/07/2009    Qualifier: Diagnosis of  By: Amil Amen MD, Benjamine Mola    . COLONIC POLYPS, HYPERPLASTIC 07/12/2009   Qualifier: Diagnosis of  By: Amil Amen MD, Benjamine Mola    . IBS 10/28/2008    Qualifier: Diagnosis of  By: Leilani Merl CMA, Tiffany    . DECREASED HEARING, LEFT EAR 05/03/2010    Qualifier: Diagnosis of  By: Asa Lente MD, Jannifer Rodney   . Arthritis   . Derangement of anterior horn of lateral meniscus 04/24/2009    Annotation: Underwent right knee arthroscopic surgery 09/20/09: For  chondromalacia and anterolateral meniscectomy Qualifier: Diagnosis of  By: Amil Amen MD, Benjamine Mola    . Hyperlipidemia   . URINARY INCONTINENCE 05/03/2010    Qualifier: Diagnosis of  By: Marca Ancona RMA, Lucy    . CARCINOMA, BREAST, HX OF 11/03/2007    Qualifier: Diagnosis of  By: Amil Amen MD, Benjamine Mola    . COLONIC POLYPS, HX OF 05/03/2010    Qualifier: Diagnosis of  By: Larose Kells      Past Surgical History  Procedure Laterality Date  . Breast surgery    . Total hip arthroplasty Right 10/04/2014    Procedure: TOTAL HIP ARTHROPLASTY ANTERIOR APPROACH;  Surgeon: Mcarthur Rossetti, MD;  Location: [facility];  Service: Orthopedics;  Laterality: Right;     Current Outpatient Prescriptions  Medication Sig Dispense Refill  . amLODipine (NORVASC) 2.5 MG tablet Take 2.5 mg by mouth daily.    Marland Kitchen aspirin EC 325 MG EC tablet Take 1 tablet (325 mg total) by mouth 2 (two) times daily after a meal. 30 tablet  0  . calcium carbonate (TUMS - DOSED IN MG ELEMENTAL CALCIUM) 500 MG chewable tablet Chew 1 tablet by mouth 3 (three) times daily with meals.    . cholecalciferol (VITAMIN D) 1000 UNITS tablet Take 2,000 Units by mouth as needed.     Marland Kitchen escitalopram (LEXAPRO) 5 MG tablet 1/2 tablet daily for 1 week then increase to 1 tablet daily Once a day Orally  0  . levothyroxine (SYNTHROID, LEVOTHROID) 50 MCG tablet 1 tablet Once a day Orally 30 day(s)  1  . LIVALO 1 MG TABS Take 1 tablet by mouth daily.  0  . methocarbamol (ROBAXIN) 500 MG tablet Take 1 tablet (500 mg total) by mouth every 6 (six) hours as needed for muscle spasms. 120 tablet 11    No current facility-administered medications for this visit.    Allergies:   Codeine and Levofloxacin    Social History:  The patient  reports that she has been smoking.  She does not have any smokeless tobacco history on file. She reports that she drinks alcohol. She reports that she does not use illicit drugs.   Family History:  The patient's family history includes Breast cancer in her mother and sister; Dementia in her sister; Hypertension in her sister and sister; Stroke in her father and mother.    ROS:  Please see the history of present illness.   Otherwise, review of systems are positive for none.   All other systems are reviewed and negative.    PHYSICAL EXAM: VS:  BP 138/80 mmHg  Pulse 81  Ht 5\' 5"  (1.651 m)  Wt 157 lb 8 oz (71.442 kg)  BMI 26.21 kg/m2 , BMI Body mass index is 26.21 kg/(m^2). GENERAL:  Well appearing HEENT:  Pupils equal round and reactive, fundi not visualized, oral mucosa unremarkable, dentures NECK:  No jugular venous distention, waveform within normal limits, carotid upstroke brisk and symmetric, no bruits, no thyromegaly LYMPHATICS:  No cervical, inguinal adenopathy LUNGS:  Clear to auscultation bilaterally BACK:  No CVA tenderness CHEST:  Unremarkable HEART:  PMI not displaced or sustained,S1 and S2 within normal limits, no S3, no S4, no clicks, no rubs, no murmurs ABD:  Flat, positive bowel sounds normal in frequency in pitch, no bruits, no rebound, no guarding, no midline pulsatile mass, no hepatomegaly, no splenomegaly EXT:  2 plus pulses throughout, no edema, no cyanosis no clubbing SKIN:  No rashes no nodules NEURO:  Cranial nerves II through XII grossly intact, motor grossly intact throughout PSYCH:  Cognitively intact, oriented to person place and time   EKG:  EKG is ordered today. The ekg ordered today demonstrates sinus rhythm, rate 81, rightward axis, poor anterior R wave progression, premature atrial contractions with a brief run of  atrial tachycardia 3 beats, no acute ST-T wave changes.   Recent Labs: 10/03/2014: ALT 16 10/06/2014: BUN 12; Creatinine, Ser 0.68; Hemoglobin 10.4*; Platelets 184; Potassium 3.6*; Sodium 137    Wt Readings from Last 3 Encounters:  06/21/15 157 lb 8 oz (71.442 kg)  06/19/15 156 lb 11.2 oz (71.079 kg)  06/14/15 155 lb 14.4 oz (70.716 kg)      Other studies Reviewed: Additional studies/ records that were reviewed today include: Outside records, echo and cath. Review of the above records demonstrates:  Please see elsewhere in the note.     ASSESSMENT AND PLAN:  CHEST PAIN:  The patient describes a very atypical pain that really started as abdominal discomfort. Since the most significant episode last week  she's not had any recurrence of this. She does have smoking is a risk factor with some hypertension but she did not have heart disease at a previous catheterization. I think the pretest probability that her pain represents obstructive coronary disease is low. I don't think further cardiovascular testing would be suggested unless her symptoms recur. I would suggest she follow-up with her thyroid management and continue her anti-suppressant and if her symptoms recur she will let me know which point we would consider further stress testing. She certainly needs primary risk reduction.  TOBACCO ABUSE:  We discussed this at length.     Current medicines are reviewed at length with the patient today.  The patient does not have concerns regarding medicines.  The following changes have been made:  no change  Labs/ tests ordered today include: None    Disposition:   FU with me as needed.     Signed, Minus Breeding, MD  06/21/2015 12:45 PM    [facility]

## 2015-06-21 NOTE — Patient Instructions (Signed)
Your physician recommends that you schedule a follow-up appointment in: AS NEEDED  

## 2015-06-22 ENCOUNTER — Ambulatory Visit (HOSPITAL_COMMUNITY)
Admission: RE | Admit: 2015-06-22 | Discharge: 2015-06-22 | Disposition: A | Discharge status: Home / Self Care | Payer: Medicare Other | Source: Ambulatory Visit | Attending: Neurology | Admitting: Neurology

## 2015-06-22 DIAGNOSIS — Z853 Personal history of malignant neoplasm of breast: Secondary | ICD-10-CM | POA: Diagnosis not present

## 2015-06-22 DIAGNOSIS — Z8673 Personal history of transient ischemic attack (TIA), and cerebral infarction without residual deficits: Secondary | ICD-10-CM | POA: Diagnosis not present

## 2015-06-22 DIAGNOSIS — R42 Dizziness and giddiness: Secondary | ICD-10-CM | POA: Diagnosis not present

## 2015-06-22 DIAGNOSIS — R413 Other amnesia: Secondary | ICD-10-CM | POA: Diagnosis present

## 2015-06-22 DIAGNOSIS — H538 Other visual disturbances: Secondary | ICD-10-CM

## 2015-06-22 DIAGNOSIS — I639 Cerebral infarction, unspecified: Secondary | ICD-10-CM

## 2015-06-22 LAB — POCT I-STAT CREATININE: Creatinine, Ser: 0.8 mg/dL (ref 0.44–1.00)

## 2015-06-22 LAB — CREATININE, SERUM
Creatinine, Ser: 0.82 mg/dL (ref 0.44–1.00)
GFR calc non Af Amer: >60 mL/min (ref >60)

## 2015-06-22 MED ORDER — GADOBENATE DIMEGLUMINE 529 MG/ML IV SOLN
15.0000 mL | Freq: Once | INTRAVENOUS | Status: AC
  Administered 2015-06-22: 15 mL via INTRAVENOUS

## 2015-06-28 ENCOUNTER — Telehealth: Payer: Self-pay | Admitting: Neurology

## 2015-06-28 NOTE — Telephone Encounter (Signed)
Pt called and wanted to know if the results of her MRI were available yet/Dawn CB# [phone]

## 2015-06-29 ENCOUNTER — Telehealth: Payer: Self-pay | Admitting: *Deleted

## 2015-06-29 ENCOUNTER — Other Ambulatory Visit: Payer: Self-pay | Admitting: *Deleted

## 2015-06-29 DIAGNOSIS — I63039 Cerebral infarction due to thrombosis of unspecified carotid artery: Secondary | ICD-10-CM

## 2015-06-29 NOTE — Telephone Encounter (Signed)
-----   Message from Pieter Partridge, DO sent at 06/23/2015  7:18 AM EDT ----- MRI does not show any new strokes or other emergent findings.  It shows evidence of chronic changes from her old stroke.  I would like to check fasting lipid panel and carotid doppler to make sure management of secondary stroke factors are optimized.  We can discuss further at her follow up next month

## 2015-06-29 NOTE — Telephone Encounter (Signed)
Patient is aware of her  MRI results she will have fasting lipids done on 06/30/15 and doppler at [facility] 12:45pm 06/30/15

## 2015-06-29 NOTE — Telephone Encounter (Signed)
Patient is aware of MRI results

## 2015-06-30 ENCOUNTER — Other Ambulatory Visit: Payer: Medicare Other

## 2015-06-30 ENCOUNTER — Ambulatory Visit (HOSPITAL_COMMUNITY)
Admission: RE | Admit: 2015-06-30 | Discharge: 2015-06-30 | Disposition: A | Discharge status: Home / Self Care | Payer: Medicare Other | Source: Ambulatory Visit | Attending: Neurology | Admitting: Neurology

## 2015-06-30 DIAGNOSIS — I63039 Cerebral infarction due to thrombosis of unspecified carotid artery: Secondary | ICD-10-CM

## 2015-06-30 LAB — LIPID PANEL
CHOLESTEROL: 180 mg/dL (ref 0–200)
HDL: 46 mg/dL (ref >=46)
LDL Cholesterol: 114 mg/dL — ABNORMAL HIGH (ref 0–99)
Total CHOL/HDL Ratio: 3.9 Ratio
Triglycerides: 100 mg/dL (ref <150)
VLDL: 20 mg/dL (ref 0–40)

## 2015-06-30 NOTE — Progress Notes (Signed)
*  PRELIMINARY RESULTS* Vascular Ultrasound Carotid Duplex (Doppler) has been completed.  Preliminary findings: Bilateral:  1-39% ICA stenosis.  Vertebral artery flow is antegrade.      Landry Mellow, RDMS, RVT  06/30/2015, 1:26 PM

## 2015-07-03 ENCOUNTER — Telehealth: Payer: Self-pay | Admitting: *Deleted

## 2015-07-03 NOTE — Telephone Encounter (Signed)
Patient is aware of lab results Dr Marrow has increased her pravastatin to  2mg  daily

## 2015-07-03 NOTE — Telephone Encounter (Signed)
Patient is aware that doppler was normal

## 2015-07-03 NOTE — Telephone Encounter (Signed)
-----   Message from Pieter Partridge, DO sent at 07/03/2015  6:42 AM EDT ----- Overall, her cholesterol looks okay.  LDL is 114.  Given her history of stroke, it should be less than 100.  I see she is taking pravastatin 1mg  daily.  I would check with Dr. Orland Mustard to see if she can increase that to 2mg  daily.

## 2015-07-03 NOTE — Telephone Encounter (Signed)
-----   Message from Pieter Partridge, DO sent at 07/03/2015  6:43 AM EDT ----- Carotid doppler looks okay

## 2015-07-21 ENCOUNTER — Ambulatory Visit (INDEPENDENT_AMBULATORY_CARE_PROVIDER_SITE_OTHER): Payer: Medicare Other | Admitting: Neurology

## 2015-07-21 ENCOUNTER — Encounter: Payer: Self-pay | Admitting: Neurology

## 2015-07-21 VITALS — BP 138/64 | HR 72 | Wt 156.2 lb

## 2015-07-21 DIAGNOSIS — R413 Other amnesia: Secondary | ICD-10-CM

## 2015-07-21 DIAGNOSIS — I10 Essential (primary) hypertension: Secondary | ICD-10-CM | POA: Diagnosis not present

## 2015-07-21 DIAGNOSIS — I679 Cerebrovascular disease, unspecified: Secondary | ICD-10-CM | POA: Diagnosis not present

## 2015-07-21 DIAGNOSIS — E785 Hyperlipidemia, unspecified: Secondary | ICD-10-CM

## 2015-07-21 DIAGNOSIS — Z720 Tobacco use: Secondary | ICD-10-CM

## 2015-07-21 NOTE — Progress Notes (Signed)
NEUROLOGY FOLLOW UP OFFICE NOTE  Michaela Guerra [id number]  HISTORY OF PRESENT ILLNESS: Michaela Guerra is a 69 year old left-handed woman with cerebrovascular disease, hypertension, hyperlipidemia, COPD, hypothyroidism, depression and history of stroke, breast cancer and Polio who follows up for memory problems.  Labs, carotid duplex report and images of brain MRI reviewed.  She is accompanied by her daughter.  UPDATE: MRI of the brain with and without contrast performed on 06/22/15 showed progressed chronic small vessel ischemic changes with remote lacunar infarct in the left internal capsule.  B12 was 464.  LDL was 114.  Carotid doppler performed 06/30/15 showed no hemodynamically significant stenosis.  HISTORY: She reports problems with memory "all of my life".  Specifically, she reports occasionally misplacing her keys or unable to find her car in the parking lot after leaving a store.  She denies problems with recognizing names and faces of people she knows.  She denies disorientation while driving.  She is able to perform all of her ADLs and manages her finances without difficulty.  She notes difficulty focusing and paying attention when she is reading.  She does report some anxiety.  Montreal Cognitive Assessment score from 06/19/15 was 26.  She had a sister with Alzheimer's dementia.  PAST MEDICAL HISTORY: Past Medical History  Diagnosis Date  . Hypothyroidism   . Breast cancer     Adenocarcinoma, radiation and surgery  . Hypertension   . COPD (chronic obstructive pulmonary disease)   . Stroke 11/2013  . GERD (gastroesophageal reflux disease)   . COLONIC POLYPS, HYPERPLASTIC 07/12/2009    Qualifier: Diagnosis of  By: Amil Amen MD, Benjamine Mola    . DECREASED HEARING, LEFT EAR 05/03/2010    Qualifier: Diagnosis of  By: Asa Lente MD, Jannifer Rodney   . Arthritis   . Derangement of anterior horn of lateral meniscus 04/24/2009    Annotation: Underwent right knee arthroscopic surgery 09/20/09: For   chondromalacia and anterolateral meniscectomy Qualifier: Diagnosis of  By: Amil Amen MD, Benjamine Mola    . Hyperlipidemia   . Depression     MEDICATIONS: Current Outpatient Prescriptions on File Prior to Visit  Medication Sig Dispense Refill  . amLODipine (NORVASC) 2.5 MG tablet Take 2.5 mg by mouth daily.    . calcium carbonate (TUMS - DOSED IN MG ELEMENTAL CALCIUM) 500 MG chewable tablet Chew 1 tablet by mouth 3 (three) times daily with meals.    . cholecalciferol (VITAMIN D) 1000 UNITS tablet Take 2,000 Units by mouth as needed.     Marland Kitchen escitalopram (LEXAPRO) 5 MG tablet 1/2 tablet daily for 1 week then increase to 1 tablet daily Once a day Orally  0  . levothyroxine (SYNTHROID, LEVOTHROID) 50 MCG tablet 1 tablet Once a day Orally 30 day(s)  1  . methocarbamol (ROBAXIN) 500 MG tablet Take 1 tablet (500 mg total) by mouth every 6 (six) hours as needed for muscle spasms. 120 tablet 11   No current facility-administered medications on file prior to visit.    ALLERGIES: Allergies  Allergen Reactions  . Codeine     REACTION: causes skin to feel like it's crawling  . Levofloxacin     REACTION: nausea    FAMILY HISTORY: Family History  Problem Relation Age of Onset  . Breast cancer Mother   . Stroke Mother   . Stroke Father   . Breast cancer Sister   . Hypertension Sister   . Hypertension Sister   . Dementia Sister     SOCIAL HISTORY: Social History  Social History  . Marital Status: Single    Spouse Name: N/A  . Number of Children: 5  . Years of Education: N/A   Occupational History  . Retired    Social History Main Topics  . Smoking status: Current Every Day Smoker -- 1.00 packs/day for 40 years  . Smokeless tobacco: Not on file     Comment: Has cut back to 1 ppd she is aware she needs to quit   . Alcohol Use: 0.0 oz/week    0 Standard drinks or equivalent per week     Comment: once every two weeks  . Drug Use: No  . Sexual Activity: No   Other Topics Concern    . Not on file   Social History Narrative   Lives alone.      REVIEW OF SYSTEMS: Constitutional: No fevers, chills, or sweats, no generalized fatigue, change in appetite Eyes: No visual changes, double vision, eye pain Ear, nose and throat: No hearing loss, ear pain, nasal congestion, sore throat Cardiovascular: No chest pain, palpitations Respiratory:  No shortness of breath at rest or with exertion, wheezes GastrointestinaI: No nausea, vomiting, diarrhea, abdominal pain, fecal incontinence Genitourinary:  No dysuria, urinary retention or frequency Musculoskeletal:  No neck pain, back pain Integumentary: No rash, pruritus, skin lesions Neurological: as above Psychiatric: No depression, insomnia, anxiety Endocrine: No palpitations, fatigue, diaphoresis, mood swings, change in appetite, change in weight, increased thirst Hematologic/Lymphatic:  No anemia, purpura, petechiae. Allergic/Immunologic: no itchy/runny eyes, nasal congestion, recent allergic reactions, rashes  PHYSICAL EXAM: Filed Vitals:   07/21/15 1122  BP: 138/64  Pulse: 72   General: No acute distress.  Patient appears well-groomed.   Head:  Normocephalic/atraumatic  IMPRESSION: Memory deficits.  I don't appreciate any obvious signs of cognitive impairment at this time Cerebrovascular disease Tobacco abuse Hyperlipidemia Hypertension  PLAN: 1.  Neurocognitive testing was suggested but she decided to wait and monitor her symptoms.  She will follow up in 6 months for recheck.  However, if she decides she would like to proceed with neurocognitive testing, she will call us. 2.  Benefits of smoking cessation discussed. 3.  ASA 81mg  daily for secondary stroke prevention. 4.  Pitavastatin 2mg  daily (LDL goal should be less than 100) 5.  Continue blood pressure management 6.  Mediterranean diet 7.  Follow up in 6 months.  15 minutes spent face to face with patient, 100% spent discussing differential diagnosis and  management.  Metta Clines, DO  CC:  London Pepper, MD

## 2015-07-21 NOTE — Patient Instructions (Addendum)
1.  We will monitor your memory for now and recheck in 6 months.  If at anytime you would like to pursue neurocognitive testing, call us to order a referral. 2.  Continue aspirin 81mg  daily for secondary stroke prevention, Livalo 2mg  daily for high cholesterol and amlodipine for blood pressure control, 3.  Please try and quit smoking.  It is the most important thing you can do to try and prevent another stroke 4.  Mediterranean diet 5.  Follow up in 6 months.

## 2015-07-31 LAB — HM DIABETES EYE EXAM

## 2015-08-07 ENCOUNTER — Ambulatory Visit: Payer: Medicare Other | Admitting: Internal Medicine

## 2015-08-24 ENCOUNTER — Encounter: Payer: Self-pay | Admitting: Internal Medicine

## 2015-08-25 ENCOUNTER — Ambulatory Visit (INDEPENDENT_AMBULATORY_CARE_PROVIDER_SITE_OTHER): Payer: Medicare Other | Admitting: Internal Medicine

## 2015-08-25 ENCOUNTER — Encounter: Payer: Self-pay | Admitting: Internal Medicine

## 2015-08-25 VITALS — BP 120/88 | HR 83 | Temp 98.1°F | Resp 16 | Ht 65.0 in | Wt 156.0 lb

## 2015-08-25 DIAGNOSIS — E0789 Other specified disorders of thyroid: Secondary | ICD-10-CM

## 2015-08-25 DIAGNOSIS — E063 Autoimmune thyroiditis: Secondary | ICD-10-CM | POA: Diagnosis not present

## 2015-08-25 DIAGNOSIS — E038 Other specified hypothyroidism: Secondary | ICD-10-CM | POA: Insufficient documentation

## 2015-08-25 LAB — T4, FREE: Free T4: 1.17 ng/dL (ref 0.60–1.60)

## 2015-08-25 LAB — TSH: TSH: 1.42 u[IU]/mL (ref 0.35–4.50)

## 2015-08-25 LAB — T3, FREE: T3, Free: 3.3 pg/mL (ref 2.3–4.2)

## 2015-08-25 MED ORDER — SYNTHROID 50 MCG PO TABS: 50.0000 ug | ORAL_TABLET | Freq: Every day | ORAL | Status: DC

## 2015-08-25 NOTE — Progress Notes (Addendum)
Patient ID: Michaela Guerra, female   DOB: 30-Aug-1946, 69 y.o.   MRN: [id number]   HPI  Michaela Guerra is a 69 y.o.-year-old female, referred by her PCP, Dr. Orland Mustard, for management of Hashimoto's hypothyroidism. She saw Dr Buddy Duty in the past.  She describes an anxiety attack (her first one) 3 mo ago. At that time, her TSH was abnormal >> dose of LT4 increased from 25 to 50 mcg daily. She began to feel better >> last 2 weeks she started  Feel poorly again but then normalized.   Pt. has been dx with hypothyroidism in her late 61s; she was on dessicated thyroid >> severe acid reflux. She is on Levothyroxine 50 mcg, taken: - fasting - with water - separated by >30 min from b'fast  - no calcium - iron, PPIs, multivitamins   I reviewed pt's thyroid tests: Lab Results  Component Value Date   TSH 18.155* 11/14/2013   TSH 6.262* 12/29/2009   TSH 5.113* 09/07/2009   TSH 3.256 07/07/2008    Pt describes: - + fatigue - + weight gain - + 30 lbs  - no cold intolerance, + heat intolerance, difficult to stand humidity - no depression/+ anxiety (on Lexapro) - + poor sleep, + increased dreams - + palpitations - + tremors after starting Lexapro - no constipation/diarrhea - + dry skin - + hair loss  Pt denies feeling nodules in neck, hoarseness, dysphagia/odynophagia, SOB with lying down. + occasional choking at night.  She has + FH of thyroid disorders in: mother, sister. No FH of thyroid cancer.  No h/o radiation tx to head or neck. Had breast Ca >> ChTx + RxTx No recent use of iodine supplements.  I reviewed her chart and she also has a history of HTN, HL, BrCa, hip fx >> THR 2015.  ROS: Constitutional: see HPI, + nocturia, + poor sleep Eyes: no blurry vision, no xerophthalmia ENT: no sore throat, see HPI, + hypoacusis, + tinnitus Cardiovascular: no CP/SOB/+ palpitations/no leg swelling Respiratory: + cough/no SOB Gastrointestinal: no N/V/D/C/+ heartburn Musculoskeletal: no muscle/joint  aches Skin: no rashes, + hair loss Neurological: no tremors/numbness/tingling/dizziness Psychiatric: no depression/+ anxiety  Past Medical History  Diagnosis Date  . Hypothyroidism   . Hypertension   . COPD (chronic obstructive pulmonary disease)   . Stroke 11/2013  . GERD (gastroesophageal reflux disease)   . COLONIC POLYPS, HYPERPLASTIC 07/12/2009    Qualifier: Diagnosis of  By: Amil Amen MD, Benjamine Mola    . DECREASED HEARING, LEFT EAR 05/03/2010    Qualifier: Diagnosis of  By: Asa Lente MD, Jannifer Rodney   . Arthritis   . Derangement of anterior horn of lateral meniscus 04/24/2009    Annotation: Underwent right knee arthroscopic surgery 09/20/09: For  chondromalacia and anterolateral meniscectomy Qualifier: Diagnosis of  By: Amil Amen MD, Benjamine Mola    . Hyperlipidemia   . Depression   . Breast cancer     Adenocarcinoma, radiation and surgery; at age 50  . Polio     Dx at age 44 yrs  . Bleeding diathesis     Hx of   . CVA (cerebral vascular accident) 11/2013  . Anxiety    Past Surgical History  Procedure Laterality Date  . Breast surgery    . Total hip arthroplasty Right 10/04/2014    Procedure: TOTAL HIP ARTHROPLASTY ANTERIOR APPROACH;  Surgeon: Mcarthur Rossetti, MD;  Location: [facility];  Service: Orthopedics;  Laterality: Right;   Social History   Social History  . Marital Status: Single  Spouse Name: N/A  . Number of Children: 5  . Years of Education: N/A   Occupational History  . Retired    Social History Main Topics  . Smoking status: Current Every Day Smoker -- 1.00 packs/day for 40 years  . Smokeless tobacco: Not on file     Comment: Has cut back to 1 ppd she is aware she needs to quit   . Alcohol Use: 0.0 oz/week    0 Standard drinks or equivalent per week     Comment: once every two weeks  . Drug Use: No  . Sexual Activity: No   Other Topics Concern  . Not on file   Social History Narrative   Single, Lives alone.     5 daughters: 8 grandchildren, 1  great grandson   Retired   Menopause at 50 yrs   Last mammogram 08/2014      Current Outpatient Prescriptions on File Prior to Visit  Medication Sig Dispense Refill  . amLODipine (NORVASC) 2.5 MG tablet Take 2.5 mg by mouth daily.    Marland Kitchen aspirin 81 MG tablet Take 81 mg by mouth daily.    . calcium carbonate (TUMS - DOSED IN MG ELEMENTAL CALCIUM) 500 MG chewable tablet Chew 1 tablet by mouth 3 (three) times daily with meals.    . cholecalciferol (VITAMIN D) 1000 UNITS tablet Take 2,000 Units by mouth as needed.     Marland Kitchen escitalopram (LEXAPRO) 5 MG tablet 1/2 tablet daily for 1 week then increase to 1 tablet daily Once a day Orally  0  . levothyroxine (SYNTHROID, LEVOTHROID) 50 MCG tablet 1 tablet Once a day Orally 30 day(s)  1  . LIVALO 2 MG TABS   0  . methocarbamol (ROBAXIN) 500 MG tablet Take 1 tablet (500 mg total) by mouth every 6 (six) hours as needed for muscle spasms. (Patient not taking: Reported on 08/24/2015) 120 tablet 11   No current facility-administered medications on file prior to visit.   Allergies  Allergen Reactions  . Codeine     REACTION: causes skin to feel like it's crawling  . Levofloxacin     REACTION: nausea   Family History  Problem Relation Age of Onset  . Breast cancer Mother   . Stroke Mother   . Stroke Father   . Breast cancer Sister   . Hypertension Sister   . Hypertension Sister   . Dementia Sister    PE: BP 120/88 mmHg  Pulse 83  Temp(Src) 98.1 F (36.7 C) (Oral)  Resp 16  Ht 5' 5"  (1.651 m)  Wt 156 lb (70.761 kg)  BMI 25.96 kg/m2  SpO2 94% Wt Readings from Last 3 Encounters:  08/25/15 156 lb (70.761 kg)  07/21/15 156 lb 3 oz (70.846 kg)  06/21/15 157 lb 8 oz (71.442 kg)   Constitutional: overweight, in NAD Eyes: PERRLA, EOMI, no exophthalmos ENT: moist mucous membranes, no thyromegaly, but L thyroid fullness no cervical lymphadenopathy Cardiovascular: RRR, No MRG Respiratory: CTA B Gastrointestinal: abdomen soft, NT, ND,  BS+ Musculoskeletal: no deformities, strength intact in all 4 Skin: moist, warm, no rashes Neurological: no tremor with outstretched hands, DTR normal in all 4  ASSESSMENT: 1. Hashimoto's Hypothyroidism - poorly controlled  2. Thyroid fullness - L  PLAN:  1. Patient with long-standing hypothyroidism, on levothyroxine therapy, with fluctuating TFTs. She appears euthyroid, but has many complaints which can be attributed to her thyroid dysfxn.  - We discussed about correct intake of levothyroxine, fasting, with water, separated  by at least 30 minutes from breakfast, and separated by more than 4 hours from calcium, iron, multivitamins, acid reflux medications (PPIs). - will check thyroid tests today: TSH, free T4, free T3 >> after the results are back, I suggest to switch to brand name Synthroid for more consistent dosing >> she agrees to plan - If these are abnormal, she will need to return in 6-8 weeks for repeat labs - If these are normal, I will see her back in 4 months  Does not have a MyChart.   2. Thyroid fullness - pt has a more prominent L thyroid lobe >> she agrees to have a thyroid U/S checked - she has occasional choking, esp. lying down  Office Visit on 08/25/2015  Component Date Value Ref Range Status  . TSH 08/25/2015 1.42  0.35 - 4.50 uIU/mL Final  . Free T4 08/25/2015 1.17  0.60 - 1.60 ng/dL Final  . T3, Free 08/25/2015 3.3  2.3 - 4.2 pg/mL Final   Thyroid tests are normal >> I will suggest to switch to Synthroid 50 g daily. We will recheck her thyroid tests in 6-8 weeks.   Thyroid ultrasound pending. I will addend the results when they become available.  CLINICAL DATA: Thyroid fullness  EXAM: THYROID ULTRASOUND  TECHNIQUE: Ultrasound examination of the thyroid gland and adjacent soft tissues was performed.  COMPARISON: None.  FINDINGS: Right thyroid lobe  Measurements: 3.9 x 1.2 x 1.5 cm. Solid nodules are scattered throughout the right lobe. The  largest is in the upper pole measuring 7 mm.  Left thyroid lobe  Measurements: 3.5 x 1.2 x 1.2 cm. Small nodules are present in the left lobe. The largest is in the interpolar region, measure 6 mm, and is ill-defined.  Isthmus  Thickness: 3 mm. No nodules visualized.  Lymphadenopathy  None visualized.  IMPRESSION: Small bilateral nodules. The largest nodule is 7 mm in the upper pole of the right lobe. Findings do not meet current SRU consensus criteria for biopsy. Follow-up by clinical exam is recommended. If patient has known risk factors for thyroid carcinoma, consider follow-up ultrasound in 12 months. If patient is clinically hyperthyroid, consider nuclear medicine thyroid uptake and scan.Reference: Management of Thyroid Nodules Detected at [place]: Society of Radiologists in [place]. Radiology 2005; [id number].   Electronically Signed By: Marybelle Killings M.D. On: 09/01/2015 13:48  Small thyroid nodules, no thyromegaly.

## 2015-08-25 NOTE — Patient Instructions (Signed)
Please stop at the lab.  Continue Levothyroxine 50 mcg for now.  Take the thyroid hormone every day, with water, >30 minutes before breakfast, separated by >4 hours from acid reflux medications, calcium, iron, multivitamins.  Please come back for a follow-up appointment in 4 months.

## 2015-08-29 ENCOUNTER — Telehealth: Payer: Self-pay

## 2015-08-29 NOTE — Telephone Encounter (Signed)
Pt calling back regarding the [facility] appt, also requesting Lab results

## 2015-09-01 ENCOUNTER — Ambulatory Visit
Admission: RE | Admit: 2015-09-01 | Discharge: 2015-09-01 | Disposition: A | Discharge status: Home / Self Care | Payer: Medicare Other | Source: Ambulatory Visit | Attending: Internal Medicine | Admitting: Internal Medicine

## 2015-09-01 DIAGNOSIS — E0789 Other specified disorders of thyroid: Secondary | ICD-10-CM

## 2015-09-07 ENCOUNTER — Telehealth: Payer: Self-pay | Admitting: Internal Medicine

## 2015-09-07 NOTE — Telephone Encounter (Signed)
Pt would like results of tests please

## 2015-09-08 NOTE — Telephone Encounter (Signed)
No, the thyroid is not causing her choking. It is actually small to normal in size and it does not press on her windpipe or esophagus.  I am not sure about the cause of her choking >> I would definitely discuss with PCP and may need evaluation for GERD or esophageal strictures.

## 2015-09-08 NOTE — Telephone Encounter (Signed)
Patient calling for test result of sonogram

## 2015-09-08 NOTE — Telephone Encounter (Signed)
Called pt and advised her per Dr Gherghe's message below. Pt voiced understanding.  

## 2015-09-08 NOTE — Telephone Encounter (Signed)
Called pt and advised her per Dr Arman Filter message via [place]. Pt voiced that she feels like she's chocking and is coughing all the time. Pt questioned the "rotated thyroid." Pt wants to know if this could be the reason for the choking feeling and what should be done next. Please advise.

## 2015-09-25 ENCOUNTER — Telehealth: Payer: Self-pay | Admitting: Internal Medicine

## 2015-09-25 NOTE — Telephone Encounter (Signed)
At last visit, I did not make any other changes in her thyroid regimen but change from levothyroxine generic to Synthroid brand name and continued the same dose 50 mcg daily... If she was already on brand name Synthroid (and I did not understand correctly at the time of last visit... ??), then no change was made and her symptoms are not from the thyroid medication

## 2015-09-25 NOTE — Telephone Encounter (Signed)
Please call pt back about synthroid she started on the 5th and she is now having bowel issues and heartburn it is just getting worse and worse

## 2015-09-25 NOTE — Telephone Encounter (Signed)
Returned pt's call. Pt stated that since starting the increase she has had severe constipation (she states she never has an issue with this), she is having acid reflux and insomnia episodes. Pt stated that she had a similar situation when she was on a natural thyroid medication with her previous endocrinologist. Please advise.

## 2015-09-25 NOTE — Telephone Encounter (Signed)
I did not increase the dose after last TFTs as they were great, but switched to brand name Synthroid. She can go back to the generic LT4 if she has some at home to see how that goes.

## 2015-09-25 NOTE — Telephone Encounter (Signed)
Pt stated that she was advised to never take generic brand of Synthroid. Please advise.

## 2015-09-26 NOTE — Telephone Encounter (Signed)
Called pt and spoke with her. Advised her per Dr Arman Filter message. Advised pt that she had acid reflux issues in the past as well (per Dr Arman Filter OV note). Pt voiced that she took 3 tums before bed last night and she is feeling better today. She stated that she will continue to take the Tums or buy a generic OTC acid reflux med and take at bedtime. She will continue on the Synthroid for now. Pt will advise in a few weeks of her sx. Be advised.

## 2015-10-10 ENCOUNTER — Ambulatory Visit: Payer: Medicare Other | Admitting: Internal Medicine

## 2015-10-10 ENCOUNTER — Other Ambulatory Visit (INDEPENDENT_AMBULATORY_CARE_PROVIDER_SITE_OTHER): Payer: Medicare Other

## 2015-10-10 DIAGNOSIS — E063 Autoimmune thyroiditis: Secondary | ICD-10-CM | POA: Diagnosis not present

## 2015-10-10 DIAGNOSIS — E038 Other specified hypothyroidism: Secondary | ICD-10-CM

## 2015-10-10 LAB — T4, FREE: FREE T4: 1.11 ng/dL (ref 0.60–1.60)

## 2015-10-10 LAB — TSH: TSH: 1.48 u[IU]/mL (ref 0.35–4.50)

## 2015-10-18 ENCOUNTER — Telehealth: Payer: Self-pay | Admitting: Internal Medicine

## 2015-10-18 NOTE — Telephone Encounter (Signed)
Patient is calling for her lab results.  

## 2015-10-18 NOTE — Telephone Encounter (Signed)
Called pt and advised her per Dr Gherghe's result note. Pt voiced understanding.  

## 2015-12-07 ENCOUNTER — Telehealth: Payer: Self-pay | Admitting: Internal Medicine

## 2015-12-07 NOTE — Telephone Encounter (Signed)
Patient ask you to call her. °

## 2015-12-07 NOTE — Telephone Encounter (Signed)
Returned pt's call. Pt stated that she has these symptoms that come on suddenly; weak, feels really sick to the point she has to lay down, shakey, around 9-10 pm her stomach begins to feel jumpy and anxious, she is then unable to sleep. This does not happen every night, but regular enough that it is causing serious concern. Pt had an appt, but I was able to schedule her sooner due to a cancellation.

## 2015-12-11 NOTE — Telephone Encounter (Signed)
She has an appt with me in 4 days. She can come beforehand for TFTs if she can, if not we can check them then: TSH and fT4. Last TFTs were normal in 10/2015. May also need to contact PCP if her sxs continue.

## 2015-12-12 ENCOUNTER — Other Ambulatory Visit (INDEPENDENT_AMBULATORY_CARE_PROVIDER_SITE_OTHER): Payer: Medicare Other

## 2015-12-12 ENCOUNTER — Other Ambulatory Visit: Payer: Self-pay | Admitting: *Deleted

## 2015-12-12 DIAGNOSIS — E063 Autoimmune thyroiditis: Principal | ICD-10-CM

## 2015-12-12 DIAGNOSIS — E038 Other specified hypothyroidism: Secondary | ICD-10-CM

## 2015-12-12 LAB — T4, FREE: Free T4: 1.08 ng/dL (ref 0.60–1.60)

## 2015-12-12 LAB — TSH: TSH: 0.76 u[IU]/mL (ref 0.35–4.50)

## 2015-12-12 NOTE — Telephone Encounter (Signed)
Called pt and advised her. Pt to come in for labs today.

## 2015-12-15 ENCOUNTER — Ambulatory Visit (INDEPENDENT_AMBULATORY_CARE_PROVIDER_SITE_OTHER): Payer: Medicare Other | Admitting: Internal Medicine

## 2015-12-15 ENCOUNTER — Encounter: Payer: Self-pay | Admitting: Internal Medicine

## 2015-12-15 VITALS — BP 118/70 | HR 109 | Temp 98.1°F | Resp 12 | Wt 160.6 lb

## 2015-12-15 DIAGNOSIS — R140 Abdominal distension (gaseous): Secondary | ICD-10-CM | POA: Diagnosis not present

## 2015-12-15 DIAGNOSIS — E063 Autoimmune thyroiditis: Secondary | ICD-10-CM | POA: Diagnosis not present

## 2015-12-15 DIAGNOSIS — E0789 Other specified disorders of thyroid: Secondary | ICD-10-CM

## 2015-12-15 DIAGNOSIS — E038 Other specified hypothyroidism: Secondary | ICD-10-CM

## 2015-12-15 NOTE — Progress Notes (Signed)
Patient ID: Michaela Guerra, female   DOB: [birth year], 70 y.o.   MRN: [id number]   HPI  Michaela Guerra is a 70 y.o.-year-old female, returning for f/u for Hashimoto's hypothyroidism. She saw Dr Buddy Duty in the past. Last visit with me 4 mo ago.   Pt. has been dx with hypothyroidism in her late 73s; she was on dessicated thyroid >> severe acid reflux. She is on Synthroid DAW 50 mcg now, taken: - fasting - with water - separated by >30 min from b'fast  - no calcium - iron, PPIs, multivitamins   I reviewed pt's thyroid tests: Lab Results  Component Value Date   TSH 0.76 12/12/2015   TSH 1.48 10/10/2015   TSH 1.42 08/25/2015   TSH 18.155* 11/14/2013   TSH 6.262* 12/29/2009   TSH 5.113* 09/07/2009   TSH 3.256 07/07/2008   FREET4 1.08 12/12/2015   FREET4 1.11 10/10/2015   FREET4 1.17 08/25/2015    Pt describes episodes of - started during the summer, last 2 weeks ago: - + anxiety, tremors, exhaustion, bloating - + fatigue - + weight gain - + 5 lbs - no cold intolerance, + heat intolerance - no depression/+ anxiety (on Lexapro) - + poor sleep, + increased dreams - + palpitations - no constipation/diarrhea - + dry skin - + hair loss  Pt denies feeling nodules in neck, hoarseness, dysphagia/odynophagia, SOB with lying down. + occasional choking at night.  She has + FH of thyroid disorders in: mother, sister. No FH of thyroid cancer.  No h/o radiation tx to head or neck. Had breast Ca >> ChTx + RxTx No recent use of iodine supplements.  I reviewed her chart and she also has a history of HTN, HL, BrCa, hip fx >> THR 2015.  ROS: Constitutional: see HPI, + nocturia, + poor sleep Eyes: no blurry vision, no xerophthalmia ENT: no sore throat, see HPI, + hypoacusis, + tinnitus Cardiovascular: no CP/SOB/+ palpitations/no leg swelling Respiratory: + cough/no SOB Gastrointestinal: no N/V/D/C/+ heartburn Musculoskeletal: no muscle/joint aches Skin: no rashes, + hair loss Neurological: no  tremors/numbness/tingling/dizziness  I reviewed pt's medications, allergies, PMH, social hx, family hx, and changes were documented in the history of present illness. Otherwise, unchanged from my initial visit note.  Past Medical History  Diagnosis Date  . Hypothyroidism   . Hypertension   . COPD (chronic obstructive pulmonary disease) ([place])   . Stroke ([place]) 11/2013  . GERD (gastroesophageal reflux disease)   . COLONIC POLYPS, HYPERPLASTIC 07/12/2009    Qualifier: Diagnosis of  By: Amil Amen MD, Benjamine Mola    . DECREASED HEARING, LEFT EAR 05/03/2010    Qualifier: Diagnosis of  By: Asa Lente MD, Jannifer Rodney   . Arthritis   . Derangement of anterior horn of lateral meniscus 04/24/2009    Annotation: Underwent right knee arthroscopic surgery 09/20/09: For  chondromalacia and anterolateral meniscectomy Qualifier: Diagnosis of  By: Amil Amen MD, Benjamine Mola    . Hyperlipidemia   . Depression   . Breast cancer ([place])     Adenocarcinoma, radiation and surgery; at age 28  . Polio     Dx at age 13 yrs  . Bleeding diathesis ([place])     Hx of   . CVA (cerebral vascular accident) ([place]) 11/2013  . Anxiety    Past Surgical History  Procedure Laterality Date  . Breast surgery    . Total hip arthroplasty Right 10/04/2014    Procedure: TOTAL HIP ARTHROPLASTY ANTERIOR APPROACH;  Surgeon: Mcarthur Rossetti, MD;  Location:  [facility];  Service: Orthopedics;  Laterality: Right;   Social History   Social History  . Marital Status: Single    Spouse Name: N/A  . Number of Children: 5  . Years of Education: N/A   Occupational History  . Retired    Social History Main Topics  . Smoking status: Current Every Day Smoker -- 1.00 packs/day for 40 years  . Smokeless tobacco: Not on file     Comment: Has cut back to 1 ppd she is aware she needs to quit   . Alcohol Use: 0.0 oz/week    0 Standard drinks or equivalent per week     Comment: once every two weeks  . Drug Use: No  . Sexual Activity: No   Other  Topics Concern  . Not on file   Social History Narrative   Single, Lives alone.     5 daughters: 34 grandchildren, 1 great grandson   Retired   Menopause at 89 yrs   Last mammogram 08/2014      Current Outpatient Prescriptions on File Prior to Visit  Medication Sig Dispense Refill  . amLODipine (NORVASC) 2.5 MG tablet Take 2.5 mg by mouth daily.    Marland Kitchen aspirin 81 MG tablet Take 81 mg by mouth daily.    . calcium carbonate (TUMS - DOSED IN MG ELEMENTAL CALCIUM) 500 MG chewable tablet Chew 1 tablet by mouth 3 (three) times daily with meals.    . cholecalciferol (VITAMIN D) 1000 UNITS tablet Take 2,000 Units by mouth as needed.     Marland Kitchen LIVALO 2 MG TABS   0  . methocarbamol (ROBAXIN) 500 MG tablet Take 1 tablet (500 mg total) by mouth every 6 (six) hours as needed for muscle spasms. 120 tablet 11  . SYNTHROID 50 MCG tablet Take 1 tablet (50 mcg total) by mouth daily before breakfast. 60 tablet 1   No current facility-administered medications on file prior to visit.   Allergies  Allergen Reactions  . Codeine     REACTION: causes skin to feel like it's crawling  . Levofloxacin     REACTION: nausea   Family History  Problem Relation Age of Onset  . Breast cancer Mother   . Stroke Mother   . Stroke Father   . Breast cancer Sister   . Hypertension Sister   . Hypertension Sister   . Dementia Sister    PE: BP 118/70 mmHg  Pulse 109  Temp(Src) 98.1 F (36.7 C) (Oral)  Resp 12  Wt 160 lb 9.6 oz (72.848 kg)  SpO2 93% Wt Readings from Last 3 Encounters:  12/15/15 160 lb 9.6 oz (72.848 kg)  08/25/15 156 lb (70.761 kg)  07/21/15 156 lb 3 oz (70.846 kg)   Constitutional: overweight, in NAD Eyes: PERRLA, EOMI, no exophthalmos ENT: moist mucous membranes, no thyromegaly, but L thyroid fullness no cervical lymphadenopathy Cardiovascular: RRR, No MRG Respiratory: CTA B Gastrointestinal: abdomen soft, NT, ND, BS+ Musculoskeletal: no deformities, strength intact in all 4 Skin: moist,  warm, no rashes Neurological: no tremor with outstretched hands, DTR normal in all 4  ASSESSMENT: 1. Hashimoto's Hypothyroidism - poorly controlled  2. Thyroid fullness - L  - Thyroid U/S (09/04/2015): Right thyroid lobe  Measurements: 3.9 x 1.2 x 1.5 cm. Solid nodules are scattered throughout the right lobe. The largest is in the upper pole measuring 7 mm.  Left thyroid lobe  Measurements: 3.5 x 1.2 x 1.2 cm. Small nodules are present in the left lobe. The  largest is in the interpolar region, measure 6 mm, and is ill-defined.  Isthmus  Thickness: 3 mm. No nodules visualized.  Lymphadenopathy  None visualized.  IMPRESSION: Small bilateral nodules. The largest nodule is 7 mm in the upper pole of the right lobe. Findings do not meet current SRU consensus criteria for biopsy.  Small thyroid nodules, no thyromegaly.  PLAN:  1. Patient with long-standing hypothyroidism, on Synthroid therapy (50 mcg daily), with fluctuating TFTs. She appears euthyroid, but has many complaints which can be attributed to her thyroid dysfxn.  - We discussed about correct intake of levothyroxine, fasting, with water, separated by at least 30 minutes from breakfast, and separated by more than 4 hours from calcium, iron, multivitamins, acid reflux medications (PPIs). - we review thyroid tests from 3 days ago: TSH, free T4 >> normal - continue current LT4 dose - I will see her back in 6 months  Does not have a MyChart.   2. Thyroid fullness - pt has a more prominent L thyroid lobe >> thyroid U/S: small nodule - no further investigation needed but will follow her clinically  3. Bloating + anxiety, fatigue, GERD - advised to leave out gluten - advised to start drinking alkaline water - referred to nutrition  Lab on 12/12/2015  Component Date Value Ref Range Status  . TSH 12/12/2015 0.76  0.35 - 4.50 uIU/mL Final  . Free T4 12/12/2015 1.08  0.60 - 1.60 ng/dL Final

## 2015-12-15 NOTE — Patient Instructions (Signed)
Please schedule an appt with Michaela Guerra with nutrition.  Please continue Synhtroid 50 mcg in am.  Take the thyroid hormone every day, with water, at least 30 minutes before breakfast, separated by at least 4 hours from: - acid reflux medications - calcium - iron - multivitamins  Please come back for a follow-up appointment in 6 months.

## 2015-12-27 ENCOUNTER — Ambulatory Visit: Payer: Medicare Other | Admitting: Internal Medicine

## 2016-01-10 ENCOUNTER — Ambulatory Visit: Payer: Medicare Other | Admitting: Neurology

## 2016-01-11 ENCOUNTER — Other Ambulatory Visit: Payer: Self-pay | Admitting: Internal Medicine

## 2016-01-25 ENCOUNTER — Encounter: Payer: Self-pay | Admitting: Dietician

## 2016-01-25 ENCOUNTER — Encounter: Payer: Medicare Other | Attending: Internal Medicine | Admitting: Dietician

## 2016-01-25 VITALS — Ht 65.0 in | Wt 158.0 lb

## 2016-01-25 DIAGNOSIS — R140 Abdominal distension (gaseous): Secondary | ICD-10-CM

## 2016-01-25 DIAGNOSIS — K219 Gastro-esophageal reflux disease without esophagitis: Secondary | ICD-10-CM | POA: Diagnosis not present

## 2016-01-25 NOTE — Patient Instructions (Addendum)
  Plan: Consider the [place] to help your food stamps go farther. Consider stopping smoking. Go to the park.  Enjoy nature and sun.   Follow guidelines for Anti reflux diet to see if this helps. Consider Keifer or yogurt for probiotic. Consider trial of low lactose diet (avoiding milk) If no help otherwise consider trial of gluten free. Don't skip meals.  Try to always have a small breakfast.

## 2016-01-25 NOTE — Progress Notes (Signed)
Medical Nutrition Therapy:  Appt start time: 1100 end time:  1200.   Assessment:  Primary concerns today: Patient is here alone and was referred for bloating.  She describes as a huge wave that hit her abdomen and made her feel like projectile vomiting.  She states that the sensation has not been as frequent since she has started on the new thyroid medication.  She stated that some have said it is a possible panic attack and she gets increased anxiety after the initial wave.  She states that after she eats (even if it is just a salad) she feels severe anxiety and feels like lying down.  Hx includes:  Hashimoto's thyroiditis, GERD, CVA (12.2014), hard of hearing, depression, hyperlipidemia, Breast Cancer (xrt and surgery age 20), and colon polyps.  She reports a "nervous stomach" at age 70.  She continues to smoke but Chantix made her have negative mental thoughts.  Current weight 158 lbs with UBW 150 lbs which has increased over the past 2-3 months.  Her last HgbA1C was 6.2% 11/14/2013.  She did not know if this had been evaluated recently.  Nutrition focused physical exam performed which showed mild depletion of muscle mass at temples but adequate fat stores and muscle at other sites.  She states that she questioned if she is lactose intolerant.  She reports reflux symptoms even with water and with peanut butter recently.   She dislikes sweets and rarely eats meat.  She dislikes chicken and fish.  She has decreased resources for food and only receives $26 per month in food stamps.  She lives alone in a retirement community.  Her daughter helps with groceries at times.    Wt Readings from Last 3 Encounters:  01/25/16 158 lb (71.668 kg)  12/15/15 160 lb 9.6 oz (72.848 kg)  08/25/15 156 lb (70.761 kg)   Preferred Learning Style:   No preference indicated   Learning Readiness:   Ready  MEDICATIONS: see list   DIETARY INTAKE:  Usual eating pattern includes 2 meals and 0-1 snacks per  day.  Everyday foods include canned foods, bread and peanut butter, beans,  Avoided foods include chicken, fish, decreased sweets.    24-hr recall:  B ( AM):   Snk ( AM):   L (12PM): oatmeal or egg and toast or cereal with whole milk Snk ( PM):  D ( PM): canned vegetables, canned beans, white bread with peanut butter or bologna or pimento cheese Snk ( PM): occasional peanut butter and crackers or graham cracker or lemon cookie Beverages: 1 glass milk every other day, coffee with 1 level tsp sugar and whole milk, water, sweet tea  Usual physical activity: ADL's.  "depression keeps me not interested"  Estimated energy needs: 1600 calories 60 g protein  Progress Towards Goal(s):  In progress.   Nutritional Diagnosis:  NB-1.1 Food and nutrition-related knowledge deficit As related to GERD and current bloating.  As evidenced by patient report.    Intervention:  Nutrition counseling/education related to GERD and suggestions to avoid bloating.  Discussed use of foods with probiotics adequate money for food is an issue.  Discussed that food stamp card is worth more when used at the [employer].  Gave meal site handout and handout on food pantry's in [facility].  Discussed trial of a low lactose diet as well and stated if still no success to avoid gluten to see if this decreased symptoms.  Discussed changes slow and temporary unless noted benefit.  Discussed  smoking cessation as this can increase problems of GERD and other health issues.  Plan: Consider the [place] to help your food stamps go farther. Consider stopping smoking. Go to the park.  Enjoy nature and sun.   Follow guidelines for Anti reflux diet to see if this helps. Consider Keifer or yogurt for probiotic. Consider trial of low lactose diet (avoiding milk) If no help otherwise consider trial of gluten free. Don't skip meals.  Try to always have a small breakfast.  Teaching Method  Utilized:  Auditory  Handouts given during visit include:  Nutrition Therapy for GERD from [place] for lactose intolerance from AND  Gluten Free diet from [place] insecurity handout   Barriers to learning/adherence to lifestyle change: lack of resources  Demonstrated degree of understanding via:  Teach Back   Monitoring/Evaluation:  Dietary intake, exercise, and body weight prn.

## 2016-02-05 ENCOUNTER — Encounter (HOSPITAL_COMMUNITY): Payer: Self-pay | Admitting: *Deleted

## 2016-02-05 ENCOUNTER — Emergency Department (HOSPITAL_COMMUNITY): Payer: Medicare Other

## 2016-02-05 ENCOUNTER — Emergency Department (HOSPITAL_COMMUNITY)
Admission: EM | Admit: 2016-02-05 | Discharge: 2016-02-05 | Disposition: A | Discharge status: Home / Self Care | Payer: Medicare Other | Attending: Emergency Medicine | Admitting: Emergency Medicine

## 2016-02-05 DIAGNOSIS — S20212A Contusion of left front wall of thorax, initial encounter: Secondary | ICD-10-CM | POA: Insufficient documentation

## 2016-02-05 DIAGNOSIS — Z8612 Personal history of poliomyelitis: Secondary | ICD-10-CM | POA: Insufficient documentation

## 2016-02-05 DIAGNOSIS — S29001A Unspecified injury of muscle and tendon of front wall of thorax, initial encounter: Secondary | ICD-10-CM | POA: Diagnosis present

## 2016-02-05 DIAGNOSIS — Y998 Other external cause status: Secondary | ICD-10-CM | POA: Diagnosis not present

## 2016-02-05 DIAGNOSIS — F17200 Nicotine dependence, unspecified, uncomplicated: Secondary | ICD-10-CM | POA: Diagnosis not present

## 2016-02-05 DIAGNOSIS — M1990 Unspecified osteoarthritis, unspecified site: Secondary | ICD-10-CM | POA: Diagnosis not present

## 2016-02-05 DIAGNOSIS — W1839XA Other fall on same level, initial encounter: Secondary | ICD-10-CM | POA: Insufficient documentation

## 2016-02-05 DIAGNOSIS — E785 Hyperlipidemia, unspecified: Secondary | ICD-10-CM | POA: Insufficient documentation

## 2016-02-05 DIAGNOSIS — K219 Gastro-esophageal reflux disease without esophagitis: Secondary | ICD-10-CM | POA: Diagnosis not present

## 2016-02-05 DIAGNOSIS — Z853 Personal history of malignant neoplasm of breast: Secondary | ICD-10-CM | POA: Insufficient documentation

## 2016-02-05 DIAGNOSIS — J449 Chronic obstructive pulmonary disease, unspecified: Secondary | ICD-10-CM | POA: Insufficient documentation

## 2016-02-05 DIAGNOSIS — F419 Anxiety disorder, unspecified: Secondary | ICD-10-CM | POA: Diagnosis not present

## 2016-02-05 DIAGNOSIS — Z86010 Personal history of colonic polyps: Secondary | ICD-10-CM | POA: Insufficient documentation

## 2016-02-05 DIAGNOSIS — Z79899 Other long term (current) drug therapy: Secondary | ICD-10-CM | POA: Diagnosis not present

## 2016-02-05 DIAGNOSIS — Y9289 Other specified places as the place of occurrence of the external cause: Secondary | ICD-10-CM | POA: Insufficient documentation

## 2016-02-05 DIAGNOSIS — Z8673 Personal history of transient ischemic attack (TIA), and cerebral infarction without residual deficits: Secondary | ICD-10-CM | POA: Diagnosis not present

## 2016-02-05 DIAGNOSIS — I10 Essential (primary) hypertension: Secondary | ICD-10-CM | POA: Diagnosis not present

## 2016-02-05 DIAGNOSIS — Z7982 Long term (current) use of aspirin: Secondary | ICD-10-CM | POA: Diagnosis not present

## 2016-02-05 DIAGNOSIS — E039 Hypothyroidism, unspecified: Secondary | ICD-10-CM | POA: Insufficient documentation

## 2016-02-05 DIAGNOSIS — Y9389 Activity, other specified: Secondary | ICD-10-CM | POA: Diagnosis not present

## 2016-02-05 MED ORDER — HYDROCODONE-ACETAMINOPHEN 5-325 MG PO TABS: 0.5000 | ORAL_TABLET | Freq: Four times a day (QID) | ORAL | Status: DC | PRN

## 2016-02-05 NOTE — ED Notes (Signed)
Dr. Goldston at bedside.  

## 2016-02-05 NOTE — ED Notes (Signed)
Pt states she fell the Sat before last.  She tripped on a curb, landing on the L side of her chest.  Now the ribs on her L side hurts constantly, especially with breathing.

## 2016-02-05 NOTE — ED Notes (Signed)
Pt given incentive spirometer and instructed on proper use. Pt verbalized understanding and demonstrated proper use of device,

## 2016-02-05 NOTE — ED Notes (Signed)
Pt ambulates independently and with steady gait at time of discharge. Discharge instructions and follow up information reviewed with patient. No other questions or concerns voiced at this time. RX x 1 given. 

## 2016-02-05 NOTE — ED Provider Notes (Signed)
CSN: [id number]     Arrival date & time 02/05/16  1130 History   First MD Initiated Contact with Patient 02/05/16 1644     Chief Complaint  Patient presents with  . Chest Pain    rib pain     (Consider location/radiation/quality/duration/timing/severity/associated sxs/prior Treatment) HPI  70 year old female presents with left sided rib/chest pain since a fall 9 days ago. She was walking in her toe caught on a curb and she fell and landed on her left chest. Since then has had continued, constant sharp chest pain. Seemed to be worse today which is why she came to the ER. No shortness of breath, nausea, or vomiting. Has a chronic dry cough that is not worse. No fevers. Has not taken anything for the pain. Patient has not been able to see if there is any bruising in this area. Currently her pain is an 8/10. Any type of movement or deep inspiration or cough mix the pain worse. No leg swelling.  Past Medical History  Diagnosis Date  . Hypothyroidism   . Hypertension   . COPD (chronic obstructive pulmonary disease) ([place])   . Stroke ([place]) 11/2013  . GERD (gastroesophageal reflux disease)   . COLONIC POLYPS, HYPERPLASTIC 07/12/2009    Qualifier: Diagnosis of  By: Amil Amen MD, Benjamine Mola    . DECREASED HEARING, LEFT EAR 05/03/2010    Qualifier: Diagnosis of  By: Asa Lente MD, Jannifer Rodney   . Arthritis   . Derangement of anterior horn of lateral meniscus 04/24/2009    Annotation: Underwent right knee arthroscopic surgery 09/20/09: For  chondromalacia and anterolateral meniscectomy Qualifier: Diagnosis of  By: Amil Amen MD, Benjamine Mola    . Hyperlipidemia   . Depression   . Breast cancer ([place])     Adenocarcinoma, radiation and surgery; at age 31  . Polio     Dx at age 65 yrs  . Bleeding diathesis ([place])     Hx of   . CVA (cerebral vascular accident) ([place]) 11/2013  . Anxiety    Past Surgical History  Procedure Laterality Date  . Breast surgery    . Total hip arthroplasty Right 10/04/2014   Procedure: TOTAL HIP ARTHROPLASTY ANTERIOR APPROACH;  Surgeon: Mcarthur Rossetti, MD;  Location: [facility];  Service: Orthopedics;  Laterality: Right;   Family History  Problem Relation Age of Onset  . Breast cancer Mother   . Stroke Mother   . Stroke Father   . Breast cancer Sister   . Hypertension Sister   . Hypertension Sister   . Dementia Sister    Social History  Substance Use Topics  . Smoking status: Current Every Day Smoker -- 1.00 packs/day for 40 years  . Smokeless tobacco: None     Comment: Has cut back to 1 ppd she is aware she needs to quit   . Alcohol Use: 0.0 oz/week    0 Standard drinks or equivalent per week     Comment: once every two weeks   OB History    No data available     Review of Systems  Constitutional: Negative for fever.  Respiratory: Positive for cough. Negative for shortness of breath.   Cardiovascular: Positive for chest pain. Negative for leg swelling.  Gastrointestinal: Negative for vomiting and abdominal pain.  Musculoskeletal: Negative for back pain.  All other systems reviewed and are negative.     Allergies  Codeine and Levofloxacin  Home Medications   Prior to Admission medications   Medication Sig Start Date End Date  Taking? Authorizing Provider  amLODipine (NORVASC) 2.5 MG tablet Take 2.5 mg by mouth daily.    Historical Provider, MD  aspirin 81 MG tablet Take 81 mg by mouth daily.    Historical Provider, MD  calcium carbonate (TUMS - DOSED IN MG ELEMENTAL CALCIUM) 500 MG chewable tablet Chew 1 tablet by mouth 3 (three) times daily with meals.    Historical Provider, MD  cholecalciferol (VITAMIN D) 1000 UNITS tablet Take 2,000 Units by mouth as needed. Reported on 01/25/2016    Historical Provider, MD  HYDROcodone-acetaminophen (NORCO) 5-325 MG tablet Take 0.5-1 tablets by mouth every 6 (six) hours as needed for severe pain. 02/05/16   Sherwood Gambler, MD  LIVALO 2 MG TABS Reported on 01/25/2016 07/12/15   Historical Provider, MD    methocarbamol (ROBAXIN) 500 MG tablet Take 1 tablet (500 mg total) by mouth every 6 (six) hours as needed for muscle spasms. 10/12/14   Gerlene Fee, NP  SYNTHROID 50 MCG tablet take 1 tablet by mouth every morning ON AN EMPTY STOMACH 01/11/16   Philemon Kingdom, MD   BP 129/70 mmHg  Pulse 77  Temp(Src) 97.7 F (36.5 C) (Oral)  Resp 18  SpO2 95% Physical Exam  Constitutional: She is oriented to person, place, and time. She appears well-developed and well-nourished.  HENT:  Head: Normocephalic and atraumatic.  Right Ear: External ear normal.  Left Ear: External ear normal.  Nose: Nose normal.  Eyes: Right eye exhibits no discharge. Left eye exhibits no discharge.  Cardiovascular: Normal rate, regular rhythm and normal heart sounds.   Pulmonary/Chest: Effort normal and breath sounds normal. No accessory muscle usage. No tachypnea. No respiratory distress. She exhibits tenderness. She exhibits no deformity.    Abdominal: Soft. There is no tenderness.  Neurological: She is alert and oriented to person, place, and time.  Skin: Skin is warm and dry.  Nursing note and vitals reviewed.   ED Course  Procedures (including critical care time) Labs Review Labs Reviewed - No data to display  Imaging Review Dg Chest 2 View  02/05/2016  CLINICAL DATA:  [place] 1 week ago onto lt side Pain under lt breast goes out to side Hurts to take deep breath Hypertension/Meds Smokes 1/2 ppd/38yrs BB marks area of pain on lt side EXAM: CHEST  2 VIEW COMPARISON:  New FINDINGS: Normal cardiac silhouette. There is bibasilar atelectasis. No pneumothorax. Limited view of the LEFT ribs demonstrates no fracture. IMPRESSION: 1. No evidence of LEFT rib fracture on two-view chest. 2. No pneumothorax. 3. Bibasilar atelectasis. Electronically Signed   By: Suzy Bouchard M.D.   On: 02/05/2016 13:40   I have personally reviewed and evaluated these images and lab results as part of my medical decision-making.   EKG  Interpretation None      MDM   Final diagnoses:  Rib contusion, left, initial encounter    Patient presents with constant left-sided anterior chest pain. No abdominal pain or swelling. Has some atelectasis seen on x-ray but no increasing cough, fevers, or shortness of breath. I highly doubt pneumonia. She appears quite well with normal vital signs. She is sitting quite comfortably although does report significant pain. She was to go home and is asking for the lowest possible dose of pain medicine. She has had hydrocodone before and usually cuts it into 1/4. Will give a course of hydrocodone and discussed possible side effects. Patient also be given an incentive spirometer. Recommend follow-up with PCP. No obvious rib fractures or pneumothorax on  her chest x-ray, I do not think CT scan would add much information at this time.    Sherwood Gambler, MD 02/05/16 [phone]

## 2016-02-06 ENCOUNTER — Telehealth: Payer: Self-pay | Admitting: Internal Medicine

## 2016-02-06 NOTE — Telephone Encounter (Signed)
Pt went to the ER with rib injury because she fell. She is in a lot of pain and doesn't know what to do because she is not to take any med after the thyroid med for 4 hours but really needs help knowing how to handle this

## 2016-02-06 NOTE — Telephone Encounter (Signed)
Tried to call pt. Unable to reach her, she does not have a vm set up on her phone. Will call again later.

## 2016-03-27 ENCOUNTER — Other Ambulatory Visit: Payer: Self-pay | Admitting: Orthopaedic Surgery

## 2016-03-27 DIAGNOSIS — M545 Low back pain: Secondary | ICD-10-CM

## 2016-05-10 ENCOUNTER — Other Ambulatory Visit: Payer: Self-pay | Admitting: Internal Medicine

## 2016-05-13 ENCOUNTER — Other Ambulatory Visit (HOSPITAL_COMMUNITY): Payer: Self-pay | Admitting: Orthopaedic Surgery

## 2016-05-13 DIAGNOSIS — M25551 Pain in right hip: Secondary | ICD-10-CM

## 2016-05-24 ENCOUNTER — Other Ambulatory Visit (HOSPITAL_COMMUNITY): Payer: Medicare Other

## 2016-05-24 ENCOUNTER — Ambulatory Visit (HOSPITAL_COMMUNITY): Admission: RE | Admit: 2016-05-24 | Payer: Medicare Other | Source: Ambulatory Visit

## 2016-05-30 ENCOUNTER — Ambulatory Visit (HOSPITAL_COMMUNITY)
Admission: RE | Admit: 2016-05-30 | Discharge: 2016-05-30 | Disposition: A | Discharge status: Home / Self Care | Payer: Medicare Other | Source: Ambulatory Visit | Attending: Orthopaedic Surgery | Admitting: Orthopaedic Surgery

## 2016-05-30 DIAGNOSIS — M25551 Pain in right hip: Secondary | ICD-10-CM

## 2016-06-13 ENCOUNTER — Encounter: Payer: Self-pay | Admitting: Internal Medicine

## 2016-06-13 ENCOUNTER — Ambulatory Visit (INDEPENDENT_AMBULATORY_CARE_PROVIDER_SITE_OTHER): Payer: Medicare Other | Admitting: Internal Medicine

## 2016-06-13 VITALS — BP 128/80 | HR 90 | Ht 64.0 in | Wt 162.0 lb

## 2016-06-13 DIAGNOSIS — E0789 Other specified disorders of thyroid: Secondary | ICD-10-CM

## 2016-06-13 DIAGNOSIS — E063 Autoimmune thyroiditis: Secondary | ICD-10-CM | POA: Diagnosis not present

## 2016-06-13 DIAGNOSIS — E038 Other specified hypothyroidism: Secondary | ICD-10-CM

## 2016-06-13 LAB — T4, FREE: FREE T4: 0.93 ng/dL (ref 0.60–1.60)

## 2016-06-13 LAB — TSH: TSH: 1.84 u[IU]/mL (ref 0.35–4.50)

## 2016-06-13 NOTE — Patient Instructions (Signed)
Please continue Synthroid 50 mcg daily.  Take the thyroid hormone every day, with water, at least 30 minutes before breakfast, separated by at least 4 hours from: - acid reflux medications - calcium - iron - multivitamins  Please stop at the lab.  Please return in 1 year.

## 2016-06-13 NOTE — Progress Notes (Signed)
Patient ID: Michaela Guerra, female   DOB: Apr 20, 1946, 70 y.o.   MRN: [id number]   HPI  Michaela Guerra is a 70 y.o.-year-old female, returning for f/u for Hashimoto's hypothyroidism.  She saw Dr Buddy Duty in the past. Last visit with me 6 mo ago.   Pt. has been dx with hypothyroidism in her late 75s; she was on dessicated thyroid >> severe acid reflux. She is on Synthroid DAW 50 mcg now, taken: - fasting - with water - separated by >30 min from b'fast  - no calcium - iron, PPIs, multivitamins   I reviewed pt's thyroid tests: Lab Results  Component Value Date   TSH 0.76 12/12/2015   TSH 1.48 10/10/2015   TSH 1.42 08/25/2015   TSH 18.155* 11/14/2013   TSH 6.262* 12/29/2009   TSH 5.113* 09/07/2009   TSH 3.256 07/07/2008   FREET4 1.08 12/12/2015   FREET4 1.11 10/10/2015   FREET4 1.17 08/25/2015    Pt c/o: - improved anxiety - no tremors - + bloating - improved fatigue - + weight gain - + 2 lbs - no cold intolerance, no heat intolerance - sleeps better  - resolved palpitations - no constipation/diarrhea - + dry skin - + hair loss  Pt denies feeling nodules in neck, hoarseness, dysphagia/odynophagia, SOB with lying down.  She has + FH of thyroid disorders in: mother, sister. No FH of thyroid cancer.  No h/o radiation tx to head or neck. Had breast Ca >> ChTx + RxTx No recent use of iodine supplements.  I reviewed her chart and she also has a history of HTN, HL, BrCa, hip fx >> THR 2015.  She smokes 1 PPD.  ROS: Constitutional: see HPI, + nocturia Eyes: no blurry vision, no xerophthalmia ENT: no sore throat, see HPI, + hypoacusis, + tinnitus Cardiovascular: no CP/SOB/palpitations/no leg swelling Respiratory: + cough/no SOB Gastrointestinal: no N/V/D/C/+ heartburn Musculoskeletal: no muscle/joint aches Skin: no rashes, + hair loss Neurological: no tremors/numbness/tingling/dizziness  I reviewed pt's medications, allergies, PMH, social hx, family hx, and changes were  documented in the history of present illness. Otherwise, unchanged from my initial visit note.  Past Medical History  Diagnosis Date  . Hypothyroidism   . Hypertension   . COPD (chronic obstructive pulmonary disease) ([place])   . Stroke ([place]) 11/2013  . GERD (gastroesophageal reflux disease)   . COLONIC POLYPS, HYPERPLASTIC 07/12/2009    Qualifier: Diagnosis of  By: Amil Amen MD, Benjamine Mola    . DECREASED HEARING, LEFT EAR 05/03/2010    Qualifier: Diagnosis of  By: Asa Lente MD, Jannifer Rodney   . Arthritis   . Derangement of anterior horn of lateral meniscus 04/24/2009    Annotation: Underwent right knee arthroscopic surgery 09/20/09: For  chondromalacia and anterolateral meniscectomy Qualifier: Diagnosis of  By: Amil Amen MD, Benjamine Mola    . Hyperlipidemia   . Depression   . Breast cancer ([place])     Adenocarcinoma, radiation and surgery; at age 2  . Polio     Dx at age 61 yrs  . Bleeding diathesis ([place])     Hx of   . CVA (cerebral vascular accident) ([place]) 11/2013  . Anxiety    Past Surgical History  Procedure Laterality Date  . Breast surgery    . Total hip arthroplasty Right 10/04/2014    Procedure: TOTAL HIP ARTHROPLASTY ANTERIOR APPROACH;  Surgeon: Mcarthur Rossetti, MD;  Location: [facility];  Service: Orthopedics;  Laterality: Right;   Social History   Social History  . Marital Status:  Single    Spouse Name: N/A  . Number of Children: 5  . Years of Education: N/A   Occupational History  . Retired    Social History Main Topics  . Smoking status: Current Every Day Smoker -- 1.00 packs/day for 40 years  . Smokeless tobacco: Not on file     Comment: Has cut back to 1 ppd she is aware she needs to quit   . Alcohol Use: 0.0 oz/week    0 Standard drinks or equivalent per week     Comment: once every two weeks  . Drug Use: No  . Sexual Activity: No   Other Topics Concern  . Not on file   Social History Narrative   Single, Lives alone.     5 daughters: 79 grandchildren, 1 great  grandson   Retired   Menopause at 14 yrs   Last mammogram 08/2014      Current Outpatient Prescriptions on File Prior to Visit  Medication Sig Dispense Refill  . amLODipine (NORVASC) 2.5 MG tablet Take 2.5 mg by mouth daily.    Marland Kitchen aspirin 81 MG tablet Take 81 mg by mouth daily.    . calcium carbonate (TUMS - DOSED IN MG ELEMENTAL CALCIUM) 500 MG chewable tablet Chew 1 tablet by mouth 3 (three) times daily with meals.    . cholecalciferol (VITAMIN D) 1000 UNITS tablet Take 2,000 Units by mouth as needed. Reported on 01/25/2016    . HYDROcodone-acetaminophen (NORCO) 5-325 MG tablet Take 0.5-1 tablets by mouth every 6 (six) hours as needed for severe pain. 15 tablet 0  . LIVALO 2 MG TABS Reported on 01/25/2016  0  . methocarbamol (ROBAXIN) 500 MG tablet Take 1 tablet (500 mg total) by mouth every 6 (six) hours as needed for muscle spasms. 120 tablet 11  . SYNTHROID 50 MCG tablet take 1 tablet by mouth every morning ON AN EMPTY STOMACH 60 tablet 1   No current facility-administered medications on file prior to visit.   Allergies  Allergen Reactions  . Codeine     REACTION: causes skin to feel like it's crawling  . Levofloxacin     REACTION: nausea   Family History  Problem Relation Age of Onset  . Breast cancer Mother   . Stroke Mother   . Stroke Father   . Breast cancer Sister   . Hypertension Sister   . Hypertension Sister   . Dementia Sister    PE: BP 128/80 mmHg  Pulse 90  Ht 5' 4"  (1.626 m)  Wt 162 lb (73.483 kg)  BMI 27.79 kg/m2  SpO2 94% Wt Readings from Last 3 Encounters:  06/13/16 162 lb (73.483 kg)  01/25/16 158 lb (71.668 kg)  12/15/15 160 lb 9.6 oz (72.848 kg)   Constitutional: overweight, in NAD Eyes: PERRLA, EOMI, no exophthalmos ENT: moist mucous membranes, no thyromegaly, but L thyroid fullness no cervical lymphadenopathy Cardiovascular: RRR, No MRG Respiratory: CTA B Gastrointestinal: abdomen soft, NT, ND, BS+ Musculoskeletal: no deformities, strength  intact in all 4 Skin: moist, warm, no rashes Neurological: no tremor with outstretched hands, DTR normal in all 4  ASSESSMENT: 1. Hashimoto's Hypothyroidism - poorly controlled  2. Thyroid fullness - L  - Thyroid U/S (09/04/2015): Right thyroid lobe: 3.9 x 1.2 x 1.5 cm. Solid nodules are scattered throughout the right lobe. The largest is in the upper pole  measuring 7 mm. Left thyroid lobe: 3.5 x 1.2 x 1.2 cm. Small nodules are present in the left lobe. The  largest is in the interpolar region, measure 6  mm, and is ill-defined. Isthmus Thickness: 3 mm. No nodules visualized. Lymphadenopathy: None visualized.  IMPRESSION: Small bilateral nodules. The largest nodule is 7 mm in the upper pole of the right lobe. Findings do not meet current SRU consensus criteria for biopsy.  Small thyroid nodules, no thyromegaly.  PLAN:  1. Patient with long-standing hypothyroidism, on Synthroid therapy (50 mcg daily), with fluctuating TFTs. She appears euthyroid. Her sxs from last visit (anxiety, poor sleep, palpitations) resolved after she moved out from the retirement community where she lived before. She still c/o bloating. - We discussed about correct intake of levothyroxine, fasting, with water, separated by at least 30 minutes from breakfast, and separated by more than 4 hours from calcium, iron, multivitamins, acid reflux medications (PPIs). She is taking it correctly. - we review thyroid tests from 12/2015: TSH, free T4 >> normal - continue current LT4 dose - I will see her back in 6 months  2. Thyroid fullness - pt has a more prominent L thyroid lobe >> thyroid U/S: small nodule - as she appears concerned, we may repeat a thyroid U/S at next visit. If stable >> no more U/S's needed  Does not have a MyChart.   Office Visit on 06/13/2016  Component Date Value Ref Range Status  . Free T4 06/13/2016 0.93  0.60 - 1.60 ng/dL Final  . TSH 06/13/2016 1.84  0.35 - 4.50 uIU/mL Final   Normal  TFTs.

## 2016-06-17 ENCOUNTER — Telehealth: Payer: Self-pay

## 2016-06-17 NOTE — Telephone Encounter (Signed)
Called and spoke with patient about normal lab results, and no change in medication needed. Patient acknowledged and was happy with results. No questions or concerns.

## 2016-07-18 ENCOUNTER — Other Ambulatory Visit: Payer: Self-pay | Admitting: Acute Care

## 2016-07-18 DIAGNOSIS — F17210 Nicotine dependence, cigarettes, uncomplicated: Principal | ICD-10-CM

## 2016-08-28 ENCOUNTER — Encounter: Payer: Medicare Other | Admitting: Acute Care

## 2016-08-28 ENCOUNTER — Ambulatory Visit: Payer: Medicare Other

## 2016-11-18 ENCOUNTER — Telehealth: Payer: Self-pay | Admitting: Cardiology

## 2016-11-18 NOTE — Telephone Encounter (Signed)
Spoke with Stanton Kidney at [facility] about form being sign and Faxed back

## 2016-11-18 NOTE — Telephone Encounter (Signed)
I see no recent documentation regarding a letter or other communication.  Will need Michaela Guerra to review.

## 2016-11-18 NOTE — Telephone Encounter (Signed)
[employer] is calling in regards to letter that was sent  Letter needs Dr's signature and date and faxed back  Fax: [phone]  If do not have, please call and request resend  [phone], Ext [phone], ref# [id number]

## 2016-11-26 ENCOUNTER — Telehealth: Payer: Self-pay | Admitting: Cardiology

## 2016-11-26 NOTE — Telephone Encounter (Signed)
New Message  Nisha from [facility] voiced there was a document signed but not indicated when it was signed. Please date and refax-[phone].  [id number]

## 2016-11-26 NOTE — Telephone Encounter (Signed)
Message routed to [facility] regarding form faxed on 12/11 needing further documentation

## 2016-12-20 ENCOUNTER — Telehealth: Payer: Self-pay | Admitting: Cardiology

## 2016-12-20 NOTE — Telephone Encounter (Signed)
Forward to nya

## 2016-12-20 NOTE — Telephone Encounter (Signed)
New message    [facility] calling paperwork that was sent back is missing date .   Deadline Monday at midnight.

## 2016-12-23 NOTE — Telephone Encounter (Signed)
Form faxed back to [employer].

## 2017-04-04 ENCOUNTER — Telehealth: Payer: Self-pay

## 2017-04-04 NOTE — Telephone Encounter (Signed)
SENT NOTES TO SCHEDULING 

## 2017-04-07 ENCOUNTER — Telehealth: Payer: Self-pay | Admitting: Cardiology

## 2017-04-07 NOTE — Telephone Encounter (Signed)
Received records from [place] for appointment with Dr Percival Spanish on 04/17/17.  Records put with Dr Hochrein's schedule for 04/17/17. lp

## 2017-04-16 NOTE — Progress Notes (Signed)
Cardiology Office Note   Date:  04/17/2017   ID:  Hennessy, Bartel 1946-11-21, MRN [id number]  PCP:  Leamon Arnt, MD  Cardiologist:   Minus Breeding, MD   Chief Complaint  Patient presents with  . Chest Pain      History of Present Illness: Michaela Guerra is a 71 y.o. female who presents for evaluation of coronary disease. She had a cardiac catheterization that she had in 2007. She had very minimal plaque at that time. She had an echocardiogram in 2014  that was essentially normal. She had chest pain when I saw her in July of 2016.  However, this was atypical and I managed this medically.     In December before Christmas she had some chest discomfort. This was a burning chest discomfort. It probably lasted for a few days but she could not get in to see her primary doctor and she didn't seek care anywhere else. It subsequently went away. She since has not had that discomfort. She has had fatigue. She's been limited by her activities because of back pain and joint problems. She has had swollen feet and this was around Christmas time as well. She actually took a picture on her phone and there was significant edema though this seems to have resolved. She's not describing PND or orthopnea. She's not describing palpitations, presyncope or syncope. I did review outside records from her primary either. She does have dizziness and lightheadedness per her report.   Past Medical History:  Diagnosis Date  . Anxiety   . Arthritis   . Bleeding diathesis ([place])    Hx of   . Breast cancer ([place])    Adenocarcinoma, radiation and surgery; at age 51  . COLONIC POLYPS, HYPERPLASTIC 07/12/2009   Qualifier: Diagnosis of  By: Amil Amen MD, Benjamine Mola    . COPD (chronic obstructive pulmonary disease) ([place])   . CVA (cerebral vascular accident) ([place]) 11/2013  . DECREASED HEARING, LEFT EAR 05/03/2010   Qualifier: Diagnosis of  By: Asa Lente MD, Jannifer Rodney   . Depression   . Derangement of anterior horn of  lateral meniscus 04/24/2009   Annotation: Underwent right knee arthroscopic surgery 09/20/09: For  chondromalacia and anterolateral meniscectomy Qualifier: Diagnosis of  By: Amil Amen MD, Benjamine Mola    . GERD (gastroesophageal reflux disease)   . Hyperlipidemia   . Hypertension   . Hypothyroidism   . Polio    Dx at age 42 yrs  . Stroke [facility]) 11/2013    Past Surgical History:  Procedure Laterality Date  . BREAST SURGERY    . TOTAL HIP ARTHROPLASTY Right 10/04/2014   Procedure: TOTAL HIP ARTHROPLASTY ANTERIOR APPROACH;  Surgeon: Mcarthur Rossetti, MD;  Location: [facility];  Service: Orthopedics;  Laterality: Right;     Current Outpatient Prescriptions  Medication Sig Dispense Refill  . aspirin 81 MG tablet Take 81 mg by mouth daily.    . carvedilol (COREG) 6.25 MG tablet Take 1 tablet by mouth 2 (two) times daily.    Marland Kitchen ezetimibe (ZETIA) 10 MG tablet Take 1 tablet by mouth daily.    . fluticasone (FLONASE) 50 MCG/ACT nasal spray Place 1 spray into the nose as needed.    Marland Kitchen levothyroxine (SYNTHROID, LEVOTHROID) 75 MCG tablet Take 75 mcg by mouth daily.  0  . meloxicam (MOBIC) 15 MG tablet Take 1 tablet by mouth as needed.    Marland Kitchen omeprazole (PRILOSEC OTC) 20 MG tablet Take 1 tablet by mouth daily.  No current facility-administered medications for this visit.     Allergies:   Statins; Codeine; and Levofloxacin      ROS:  Please see the history of present illness.   Otherwise, review of systems are positive for cough, sinus problem, back pain, hip pain, fatigue.   All other systems are reviewed and negative.    PHYSICAL EXAM: VS:  BP 138/86   Pulse 85   Ht 5\' 5"  (1.651 m)   Wt 161 lb (73 kg)   BMI 26.79 kg/m  , BMI Body mass index is 26.79 kg/m. GENERAL:  Well appearing HEENT:  Pupils equal round and reactive, fundi not visualized, oral mucosa unremarkable NECK:  No jugular venous distention, waveform within normal limits, carotid upstroke brisk and symmetric, no bruits, no  thyromegaly LYMPHATICS:  No cervical, inguinal adenopathy LUNGS:  Clear to auscultation bilaterally BACK:  No CVA tenderness CHEST:  Unremarkable HEART:  PMI not displaced or sustained,S1 and S2 within normal limits, no S3, no S4, no clicks, no rubs, no murmurs ABD:  Flat, positive bowel sounds normal in frequency in pitch, no bruits, no rebound, no guarding, no midline pulsatile mass, no hepatomegaly, no splenomegaly EXT:  2 plus pulses throughout, no edema, no cyanosis no clubbing SKIN:  No rashes no nodules NEURO:  Cranial nerves II through XII grossly intact, motor grossly intact throughout PSYCH:  Cognitively intact, oriented to person place and time   EKG:  EKG is  ordered today. The ekg ordered today demonstrates sinus rhythm, rate 85, rightward axis, poor anterior R wave progression, no acute ST-T wave changes.   Recent Labs: 06/13/2016: TSH 1.84    Wt Readings from Last 3 Encounters:  04/17/17 161 lb (73 kg)  06/13/16 162 lb (73.5 kg)  01/25/16 158 lb (71.7 kg)      Other studies Reviewed: Additional studies/ records that were reviewed today include: Office records Review of the above records demonstrates:     ASSESSMENT AND PLAN:  CHEST PAIN:  This is atypical but she does have risk factors. She needs a stress test but wouldn't be a walk on a treadmill. Therefore, she will have a [employer].    LEG SWELLING:   I'm not sure of etiology of this but I don't strongly suspect heart failure. I'll check a BNP level. It seems to have resolved. There may have been some salt use as a was around the holidays.  TOBACCO ABUSE:   We discussed this at length.  She says she is terribly habituated and cannot quit.   Current medicines are reviewed at length with the patient today.  The patient does not have concerns regarding medicines.  The following changes have been made:  None  Labs/ tests ordered today include:  Lexiscan Myoview.  Disposition:   FU with me as needed.       Signed, Minus Breeding, MD  04/17/2017 6:36 PM    [facility]

## 2017-04-17 ENCOUNTER — Encounter: Payer: Self-pay | Admitting: Cardiology

## 2017-04-17 ENCOUNTER — Ambulatory Visit (INDEPENDENT_AMBULATORY_CARE_PROVIDER_SITE_OTHER): Payer: Medicare Other | Admitting: Cardiology

## 2017-04-17 DIAGNOSIS — R0602 Shortness of breath: Secondary | ICD-10-CM | POA: Diagnosis not present

## 2017-04-17 DIAGNOSIS — R072 Precordial pain: Secondary | ICD-10-CM | POA: Diagnosis not present

## 2017-04-17 DIAGNOSIS — R5383 Other fatigue: Secondary | ICD-10-CM

## 2017-04-17 DIAGNOSIS — M7989 Other specified soft tissue disorders: Secondary | ICD-10-CM

## 2017-04-17 NOTE — Patient Instructions (Signed)
Medication Instructions:  Continue current medications  Labwork: BNP  Testing/Procedures: Your physician has requested that you have a lexiscan myoview. For further information please visit [email or web address]. Please follow instruction sheet, as given.  Follow-Up: Your physician recommends that you schedule a follow-up appointment in: As Needed   Any Other Special Instructions Will Be Listed Below (If Applicable).   If you need a refill on your cardiac medications before your next appointment, please call your pharmacy.

## 2017-04-18 LAB — BRAIN NATRIURETIC PEPTIDE: Brain Natriuretic Peptide: 36 pg/mL (ref <100)

## 2017-04-23 ENCOUNTER — Telehealth (HOSPITAL_COMMUNITY): Payer: Self-pay

## 2017-04-23 NOTE — Telephone Encounter (Signed)
Encounter complete. 

## 2017-04-24 ENCOUNTER — Ambulatory Visit (HOSPITAL_COMMUNITY)
Admission: RE | Admit: 2017-04-24 | Discharge: 2017-04-24 | Disposition: A | Discharge status: Home / Self Care | Payer: Medicare Other | Source: Ambulatory Visit | Attending: Cardiology | Admitting: Cardiology

## 2017-04-24 DIAGNOSIS — R0602 Shortness of breath: Secondary | ICD-10-CM | POA: Diagnosis not present

## 2017-04-24 DIAGNOSIS — R5383 Other fatigue: Secondary | ICD-10-CM | POA: Insufficient documentation

## 2017-04-24 LAB — MYOCARDIAL PERFUSION IMAGING
CHL CUP NUCLEAR SDS: 3
CHL CUP RESTING HR STRESS: 65 {beats}/min
LV dias vol: 70 mL (ref 46–106)
LV sys vol: 28 mL
NUC STRESS TID: 0.9
Peak HR: 86 {beats}/min
SRS: 0
SSS: 3

## 2017-04-24 MED ORDER — TECHNETIUM TC 99M TETROFOSMIN IV KIT
30.3000 | PACK | Freq: Once | INTRAVENOUS | Status: AC | PRN
Start: 2017-04-24 — End: 2017-04-24
  Administered 2017-04-24: 30.3 via INTRAVENOUS
  Filled 2017-04-24: qty 31

## 2017-04-24 MED ORDER — REGADENOSON 0.4 MG/5ML IV SOLN
0.4000 mg | Freq: Once | INTRAVENOUS | Status: AC
  Administered 2017-04-24: 0.4 mg via INTRAVENOUS

## 2017-04-24 MED ORDER — AMINOPHYLLINE 25 MG/ML IV SOLN
75.0000 mg | Freq: Once | INTRAVENOUS | Status: AC
  Administered 2017-04-24: 75 mg via INTRAVENOUS

## 2017-04-24 MED ORDER — TECHNETIUM TC 99M TETROFOSMIN IV KIT
10.1000 | PACK | Freq: Once | INTRAVENOUS | Status: AC | PRN
  Administered 2017-04-24: 10.1 via INTRAVENOUS
  Filled 2017-04-24: qty 11

## 2017-06-18 ENCOUNTER — Ambulatory Visit: Payer: Medicare Other | Admitting: Internal Medicine

## 2019-05-19 ENCOUNTER — Other Ambulatory Visit: Payer: Self-pay | Admitting: Family Medicine

## 2019-05-19 DIAGNOSIS — E2839 Other primary ovarian failure: Secondary | ICD-10-CM

## 2019-05-19 DIAGNOSIS — Z780 Asymptomatic menopausal state: Secondary | ICD-10-CM

## 2019-10-10 ENCOUNTER — Other Ambulatory Visit: Payer: Self-pay

## 2019-10-10 ENCOUNTER — Encounter: Payer: Self-pay | Admitting: Emergency Medicine

## 2019-10-10 ENCOUNTER — Ambulatory Visit
Admission: EM | Admit: 2019-10-10 | Discharge: 2019-10-10 | Disposition: A | Discharge status: Home / Self Care | Payer: Medicare Other | Attending: Physician Assistant | Admitting: Physician Assistant

## 2019-10-10 DIAGNOSIS — S0990XA Unspecified injury of head, initial encounter: Secondary | ICD-10-CM

## 2019-10-10 DIAGNOSIS — M25532 Pain in left wrist: Secondary | ICD-10-CM

## 2019-10-10 DIAGNOSIS — W19XXXA Unspecified fall, initial encounter: Secondary | ICD-10-CM

## 2019-10-10 NOTE — ED Notes (Signed)
Patient able to ambulate independently  

## 2019-10-10 NOTE — ED Provider Notes (Signed)
74 year old female with history of bleeding diathesis comes in for evaluation after fall.  States got wrapped up with the vacuum cleaner, and fell forward, hitting left face.  Denies loss of consciousness.  She landed on her wrist and knees, and is complaining of pain to the left wrist and knee. She denies headache, dizziness, confusion. Left wrist obviously deformed with swelling and contusion. She is able to ambulate on own without changes in gait or coordination.  However, given history of bleeding diathesis, age greater than 2 with head injury, patient discharged in stable condition to the ED for further evaluation and management needed.   Ok Edwards, PA-C 10/10/19 1444

## 2019-10-10 NOTE — ED Triage Notes (Signed)
PT presents to [facility] for assessment after getting wrapped up in the cord to her vacuum cleaner and fall forward hitting her left face on the couch.  Fell onto carpet, and c/o left wrist and left knee pain and swelling at this time.

## 2019-10-10 NOTE — Discharge Instructions (Signed)
73 year old female with history of bleeding diathesis comes in for head injury, left wrist pain after fall. Obvious deformity of the left wrist. However, given head injury with bleeding disorder, patient is discharged to the ED for further evaluation needed.

## 2019-10-11 ENCOUNTER — Emergency Department (HOSPITAL_COMMUNITY)
Admission: EM | Admit: 2019-10-11 | Discharge: 2019-10-11 | Disposition: A | Discharge status: Home / Self Care | Payer: Medicare Other | Attending: Emergency Medicine | Admitting: Emergency Medicine

## 2019-10-11 ENCOUNTER — Other Ambulatory Visit: Payer: Self-pay | Admitting: Orthopedic Surgery

## 2019-10-11 ENCOUNTER — Other Ambulatory Visit: Payer: Self-pay

## 2019-10-11 ENCOUNTER — Emergency Department (HOSPITAL_COMMUNITY): Payer: Medicare Other

## 2019-10-11 DIAGNOSIS — I712 Thoracic aortic aneurysm, without rupture, unspecified: Secondary | ICD-10-CM

## 2019-10-11 DIAGNOSIS — Z79899 Other long term (current) drug therapy: Secondary | ICD-10-CM | POA: Insufficient documentation

## 2019-10-11 DIAGNOSIS — S0990XA Unspecified injury of head, initial encounter: Secondary | ICD-10-CM | POA: Insufficient documentation

## 2019-10-11 DIAGNOSIS — Y92009 Unspecified place in unspecified non-institutional (private) residence as the place of occurrence of the external cause: Secondary | ICD-10-CM | POA: Insufficient documentation

## 2019-10-11 DIAGNOSIS — Z853 Personal history of malignant neoplasm of breast: Secondary | ICD-10-CM | POA: Diagnosis not present

## 2019-10-11 DIAGNOSIS — Y93E3 Activity, vacuuming: Secondary | ICD-10-CM | POA: Diagnosis not present

## 2019-10-11 DIAGNOSIS — Z8673 Personal history of transient ischemic attack (TIA), and cerebral infarction without residual deficits: Secondary | ICD-10-CM | POA: Diagnosis not present

## 2019-10-11 DIAGNOSIS — W01190A Fall on same level from slipping, tripping and stumbling with subsequent striking against furniture, initial encounter: Secondary | ICD-10-CM | POA: Diagnosis not present

## 2019-10-11 DIAGNOSIS — E039 Hypothyroidism, unspecified: Secondary | ICD-10-CM | POA: Diagnosis not present

## 2019-10-11 DIAGNOSIS — S59912A Unspecified injury of left forearm, initial encounter: Secondary | ICD-10-CM | POA: Diagnosis present

## 2019-10-11 DIAGNOSIS — J449 Chronic obstructive pulmonary disease, unspecified: Secondary | ICD-10-CM

## 2019-10-11 DIAGNOSIS — I10 Essential (primary) hypertension: Secondary | ICD-10-CM | POA: Insufficient documentation

## 2019-10-11 DIAGNOSIS — Z7982 Long term (current) use of aspirin: Secondary | ICD-10-CM | POA: Insufficient documentation

## 2019-10-11 DIAGNOSIS — S52502A Unspecified fracture of the lower end of left radius, initial encounter for closed fracture: Secondary | ICD-10-CM | POA: Insufficient documentation

## 2019-10-11 DIAGNOSIS — Y999 Unspecified external cause status: Secondary | ICD-10-CM | POA: Insufficient documentation

## 2019-10-11 DIAGNOSIS — F17210 Nicotine dependence, cigarettes, uncomplicated: Secondary | ICD-10-CM | POA: Diagnosis not present

## 2019-10-11 DIAGNOSIS — S52592A Other fractures of lower end of left radius, initial encounter for closed fracture: Secondary | ICD-10-CM

## 2019-10-11 LAB — BASIC METABOLIC PANEL
Anion gap: 11 (ref 5–15)
BUN: 8 mg/dL (ref 8–23)
CO2: 23 mmol/L (ref 22–32)
Calcium: 8.5 mg/dL — ABNORMAL LOW (ref 8.9–10.3)
Chloride: 102 mmol/L (ref 98–111)
Creatinine, Ser: 0.68 mg/dL (ref 0.44–1.00)
GFR calc Af Amer: >60 mL/min (ref >60)
GFR calc non Af Amer: >60 mL/min (ref >60)
Glucose, Bld: 115 mg/dL — ABNORMAL HIGH (ref 70–99)
Potassium: 3.5 mmol/L (ref 3.5–5.1)
Sodium: 136 mmol/L (ref 135–145)

## 2019-10-11 LAB — CBC WITH DIFFERENTIAL/PLATELET
Abs Immature Granulocytes: 0.02 10*3/uL (ref 0.00–0.07)
Basophils Absolute: 0.1 10*3/uL (ref 0.0–0.1)
Basophils Relative: 1 %
Eosinophils Absolute: 0.1 10*3/uL (ref 0.0–0.5)
Eosinophils Relative: 1 %
HCT: 39.9 % (ref 36.0–46.0)
Hemoglobin: 13.1 g/dL (ref 12.0–15.0)
Immature Granulocytes: 0 %
Lymphocytes Relative: 18 %
Lymphs Abs: 1.5 10*3/uL (ref 0.7–4.0)
MCH: 30.8 pg (ref 26.0–34.0)
MCHC: 32.8 g/dL (ref 30.0–36.0)
MCV: 93.9 fL (ref 80.0–100.0)
Monocytes Absolute: 0.6 10*3/uL (ref 0.1–1.0)
Monocytes Relative: 8 %
Neutro Abs: 6.1 10*3/uL (ref 1.7–7.7)
Neutrophils Relative %: 72 %
Platelets: 206 10*3/uL (ref 150–400)
RBC: 4.25 MIL/uL (ref 3.87–5.11)
RDW: 14 % (ref 11.5–15.5)
WBC: 8.3 10*3/uL (ref 4.0–10.5)
nRBC: 0 % (ref 0.0–0.2)

## 2019-10-11 LAB — TROPONIN I (HIGH SENSITIVITY)
Troponin I (High Sensitivity): 3 ng/L (ref <18)
Troponin I (High Sensitivity): 4 ng/L (ref <18)

## 2019-10-11 MED ORDER — IOHEXOL 350 MG/ML SOLN
100.0000 mL | Freq: Once | INTRAVENOUS | Status: AC | PRN
  Administered 2019-10-11: 10:00:00 100 mL via INTRAVENOUS

## 2019-10-11 NOTE — ED Provider Notes (Signed)
[facility] EMERGENCY DEPARTMENT Provider Note   CSN: [id number] Arrival date & time: 10/11/19  [id number]     History   Chief Complaint Chief Complaint  Patient presents with   Fall    HPI Michaela Guerra is a 73 y.o. female with a past medical history of arthritis, hypertension, prior stroke who presents to ED for evaluation of fall.  She was vacuuming her house yesterday around noon when the cord was tangled in her right foot.  States that this caused her to fall forward and hit her left cheek on her sofa.  She does not believe she had a head injury otherwise.  Has been having left wrist and left knee pain since the fall.  She has been ambulatory since the fall.  She did have a fall 3 months ago that caused her neck pain and she feels the fall yesterday exacerbated her neck pain.  She denies any anticoagulant use.  She did present to the urgent care yesterday and was told to come to the ED but states that "I was scared of coronavirus so I tried to put it off, I didn't want to come here."  She denies any dizziness, vision changes, vomiting, numbness in arms or legs, prior wrist procedure, fracture or dislocation.  She did have a "funny feeling" in her chest last night but denies any pain currently.     HPI  Past Medical History:  Diagnosis Date   Anxiety    Arthritis    Bleeding diathesis ([place])    Hx of    Breast cancer ([place])    Adenocarcinoma, radiation and surgery; at age 43   COLONIC POLYPS, HYPERPLASTIC 07/12/2009   Qualifier: Diagnosis of  By: Amil Amen MD, Elizabeth     COPD (chronic obstructive pulmonary disease) ([place])    CVA (cerebral vascular accident) ([place]) 11/2013   DECREASED HEARING, LEFT EAR 05/03/2010   Qualifier: Diagnosis of  By: Asa Lente MD, Jannifer Rodney    Depression    Derangement of anterior horn of lateral meniscus 04/24/2009   Annotation: Underwent right knee arthroscopic surgery 09/20/09: For  chondromalacia and anterolateral meniscectomy  Qualifier: Diagnosis of  By: Amil Amen MD, Benjamine Mola     GERD (gastroesophageal reflux disease)    Hyperlipidemia    Hypertension    Hypothyroidism    Polio    Dx at age 73 yrs   Stroke ([facility]) 11/2013    Patient Active Problem List   Diagnosis Date Noted   Leg swelling 04/17/2017   Precordial chest pain 04/17/2017   Other fatigue 04/17/2017   SOB (shortness of breath) 04/17/2017   Hypothyroidism due to Hashimoto's thyroiditis 08/25/2015   Cerebrovascular disease 07/21/2015   Hyperlipidemia 07/21/2015   Tobacco abuse 07/21/2015   Memory deficits 06/19/2015   Right wrist fracture 10/17/2014   Dyslipidemia 10/12/2014   Constipation 10/12/2014   Fracture of femoral neck, right ([place]) 10/12/2014   Fall    Hip fracture ([place]) 10/03/2014   COPD (chronic obstructive pulmonary disease) ([place]) 10/03/2014   CVA (cerebral infarction) 11/13/2013   Hypokalemia 11/13/2013   h/o CVA 11/13/2013   Malignant neoplasm of female breast ([place]) 05/03/2010   SMOKER 05/03/2010   ARTHRITIS 05/03/2010   ALLERGIC RHINITIS 09/07/2009   COLONIC POLYPS, HYPERPLASTIC 07/12/2009   IBS 10/28/2008   Hypothyroidism 07/07/2008   ANXIETY 03/15/2008   ALCOHOL ABUSE 03/15/2008   DEPRESSION 03/15/2008   OSTEOPENIA 02/11/2008   Essential hypertension 02/02/2008   ANEMIA, IRON DEFICIENCY NOS 10/08/2007   GERD  08/21/2007    Past Surgical History:  Procedure Laterality Date   BREAST SURGERY     TOTAL HIP ARTHROPLASTY Right 10/04/2014   Procedure: TOTAL HIP ARTHROPLASTY ANTERIOR APPROACH;  Surgeon: Mcarthur Rossetti, MD;  Location: [facility];  Service: Orthopedics;  Laterality: Right;     OB History   No obstetric history on file.      Home Medications    Prior to Admission medications   Medication Sig Start Date End Date Taking? Authorizing Provider  aspirin 81 MG tablet Take 81 mg by mouth daily.    [provider]  carvedilol (COREG) 6.25 MG tablet  Take 1 tablet by mouth 2 (two) times daily. 03/06/17 03/06/18  [provider]  ezetimibe (ZETIA) 10 MG tablet Take 1 tablet by mouth daily. 02/10/17   [provider]  fluticasone (FLONASE) 50 MCG/ACT nasal spray Place 1 spray into the nose as needed. 08/15/16   [provider]  levothyroxine (SYNTHROID, LEVOTHROID) 75 MCG tablet Take 75 mcg by mouth daily. 03/06/17   [provider]  omeprazole (PRILOSEC OTC) 20 MG tablet Take 1 tablet by mouth daily. 03/06/17 10/10/19  [provider]    Family History Family History  Problem Relation Age of Onset   Breast cancer Mother    Stroke Mother    Stroke Father    Breast cancer Sister    Hypertension Sister    Hypertension Sister    Dementia Sister     Social History Social History   Tobacco Use   Smoking status: Current Every Day Smoker    Packs/day: 1.00    Years: 40.00    Pack years: 40.00   Smokeless tobacco: Never Used   Tobacco comment: Has cut back to 1 ppd she is aware she needs to quit   Substance Use Topics   Alcohol use: Yes    Alcohol/week: 0.0 standard drinks    Comment: once every two weeks   Drug use: No     Allergies   Statins, Codeine, and Levofloxacin   Review of Systems Review of Systems  Constitutional: Negative for appetite change, chills and fever.  HENT: Negative for ear pain, rhinorrhea, sneezing and sore throat.   Eyes: Negative for photophobia and visual disturbance.  Respiratory: Negative for cough, chest tightness, shortness of breath and wheezing.   Cardiovascular: Negative for chest pain and palpitations.  Gastrointestinal: Negative for abdominal pain, blood in stool, constipation, diarrhea, nausea and vomiting.  Genitourinary: Negative for dysuria, hematuria and urgency.  Musculoskeletal: Positive for arthralgias and joint swelling. Negative for myalgias.  Skin: Negative for rash.  Neurological: Negative for dizziness, weakness and  light-headedness.     Physical Exam Updated Vital Signs BP (!) 159/84 (BP Location: Right Arm)    Pulse 63    Temp 98.2 F (36.8 C) (Oral)    Resp (!) 23    Ht 5\' 5"  (1.651 m)    Wt 70.3 kg    SpO2 95%    BMI 25.79 kg/m   Physical Exam Vitals signs and nursing note reviewed.  Constitutional:      General: She is not in acute distress.    Appearance: She is well-developed.  HENT:     Head: Normocephalic and atraumatic.     Nose: Nose normal.  Eyes:     General: No scleral icterus.       Right eye: No discharge.        Left eye: No discharge.  Conjunctiva/sclera: Conjunctivae normal.     Pupils: Pupils are equal, round, and reactive to light.  Neck:     Musculoskeletal: Normal range of motion and neck supple. Spinous process tenderness and muscular tenderness present.   Cardiovascular:     Rate and Rhythm: Normal rate and regular rhythm.     Heart sounds: Normal heart sounds. No murmur. No friction rub. No gallop.   Pulmonary:     Effort: Pulmonary effort is normal. No respiratory distress.     Breath sounds: Normal breath sounds.  Abdominal:     General: Bowel sounds are normal. There is no distension.     Palpations: Abdomen is soft.     Tenderness: There is no abdominal tenderness. There is no guarding.  Musculoskeletal: Normal range of motion.     Comments: Deformity of left wrist with surrounding edema.  Limited range of motion secondary to pain.  Sensation intact to light touch.  Edema noted to the left knee without changes to range of motion.  2+ DP pulse and radial pulse palpated on the left side.  Skin:    General: Skin is warm and dry.     Findings: No rash.  Neurological:     General: No focal deficit present.     Mental Status: She is alert and oriented to person, place, and time.     Cranial Nerves: No cranial nerve deficit.     Sensory: No sensory deficit.     Motor: No abnormal muscle tone.     Coordination: Coordination normal.      ED Treatments  / Results  Labs (all labs ordered are listed, but only abnormal results are displayed) Labs Reviewed  BASIC METABOLIC PANEL - Abnormal; Notable for the following components:      Result Value   Glucose, Bld 115 (*)    Calcium 8.5 (*)    All other components within normal limits  CBC WITH DIFFERENTIAL/PLATELET  TROPONIN I (HIGH SENSITIVITY)  TROPONIN I (HIGH SENSITIVITY)    EKG EKG Interpretation  Date/Time:  Monday October 11 2019 08:24:23 EST Ventricular Rate:  59 PR Interval:    QRS Duration: 84 QT Interval:  472 QTC Calculation: 468 R Axis:   -97 Text Interpretation: Sinus bradycardia Right superior axis rate is slower compared to Oct 2015 Interpretation limited secondary to artifact Confirmed by Sherwood Gambler [phone]) on 10/11/2019 8:27:49 AM   Radiology Dg Chest 2 View  Result Date: 10/11/2019 CLINICAL DATA:  Several recent falls.  Hypertension. EXAM: CHEST - 2 VIEW COMPARISON:  February 05, 2016 FINDINGS: Lungs are somewhat hyperexpanded. There is no appreciable edema or consolidation. Heart is mildly enlarged with pulmonary vascularity normal. No adenopathy. There is aortic atherosclerosis. There is prominence in the aortic arch region with questionable aneurysmal dilatation in this area. No adenopathy evident.  No bone lesions.  No pneumothorax. IMPRESSION: Question aneurysmal dilatation in the region of the aortic arch. It may be prudent to correlate with contrast enhanced chest CT to further assess. The aorta is more prominent than on the prior study in this region. Lungs hyperexpanded. No edema or consolidation. Stable cardiac prominence. No adenopathy evident. Electronically Signed   By: Lowella Grip III M.D.   On: 10/11/2019 09:02   Dg Wrist Complete Left  Result Date: 10/11/2019 CLINICAL DATA:  Status post fall. EXAM: LEFT WRIST - COMPLETE 3+ VIEW COMPARISON:  6/20/8 FINDINGS: Comminuted fracture deformity involving the distal aspect of the radius is identified.  There is dorsal angulation of  the distal fracture fragments. Impacted fracture of the ulnar styloid is also noted. IMPRESSION: 1. Acute, comminuted fracture involves the distal radius. Dorsal angulation of the distal fracture fragments. 2. Ulnar styloid fracture. Electronically Signed   By: Kerby Moors M.D.   On: 10/11/2019 08:58   Ct Head Wo Contrast  Result Date: 10/11/2019 CLINICAL DATA:  [place] yesterday and hit head. EXAM: CT HEAD WITHOUT CONTRAST CT CERVICAL SPINE WITHOUT CONTRAST TECHNIQUE: Multidetector CT imaging of the head and cervical spine was performed following the standard protocol without intravenous contrast. Multiplanar CT image reconstructions of the cervical spine were also generated. COMPARISON:  MRI brain 06/22/2015 FINDINGS: CT HEAD FINDINGS Brain: The ventricles are in the midline without mass effect or shift. They are normal in size and configuration for the patient's age and degree of cerebral atrophy. There is fairly advanced and extensive periventricular white matter disease. No acute intracranial findings such as hemispheric infarction or intracranial hemorrhage. No extra-axial fluid collections are identified. The brainstem and cerebellum appear normal. Vascular: Vascular calcifications but no definite aneurysm or hyperdense vessels. Skull: No skull fracture or bone lesions. Diffusely thickened calvarium appears stable. Sinuses/Orbits: The paranasal sinuses and mastoid air cells are clear. Other: No scalp lesions or hematoma. CT CERVICAL SPINE FINDINGS Alignment: Normal. Skull base and vertebrae: No acute fracture. No primary bone lesion or focal pathologic process. Soft tissues and spinal canal: No prevertebral fluid or swelling. No visible canal hematoma. Disc levels: The spinal canal is fairly generous. No significant spinal stenosis. There is moderate degenerative disc disease at C5-6 with posterior osteophytic ridging and uncinate spurring. There is flattening of the ventral  thecal sac and mild bilateral foraminal stenosis. Upper chest: Aneurysmal dilatation of the top of the aortic arch measuring 4.6 cm. The lung apices are grossly clear. Other: No neck mass or adenopathy. IMPRESSION: 1. No acute intracranial findings or mass lesions. Age advanced periventricular white matter disease. 2. No acute skull fracture. 3. Normal alignment of the cervical vertebral bodies and no acute cervical spine fracture. 4. Thoracic aortic aneurysm measuring at least 4.6 cm. Recommend chest CT with contrast for further evaluation. Electronically Signed   By: Marijo Sanes M.D.   On: 10/11/2019 10:12   Ct Cervical Spine Wo Contrast  Result Date: 10/11/2019 CLINICAL DATA:  [place] yesterday and hit head. EXAM: CT HEAD WITHOUT CONTRAST CT CERVICAL SPINE WITHOUT CONTRAST TECHNIQUE: Multidetector CT imaging of the head and cervical spine was performed following the standard protocol without intravenous contrast. Multiplanar CT image reconstructions of the cervical spine were also generated. COMPARISON:  MRI brain 06/22/2015 FINDINGS: CT HEAD FINDINGS Brain: The ventricles are in the midline without mass effect or shift. They are normal in size and configuration for the patient's age and degree of cerebral atrophy. There is fairly advanced and extensive periventricular white matter disease. No acute intracranial findings such as hemispheric infarction or intracranial hemorrhage. No extra-axial fluid collections are identified. The brainstem and cerebellum appear normal. Vascular: Vascular calcifications but no definite aneurysm or hyperdense vessels. Skull: No skull fracture or bone lesions. Diffusely thickened calvarium appears stable. Sinuses/Orbits: The paranasal sinuses and mastoid air cells are clear. Other: No scalp lesions or hematoma. CT CERVICAL SPINE FINDINGS Alignment: Normal. Skull base and vertebrae: No acute fracture. No primary bone lesion or focal pathologic process. Soft tissues and spinal  canal: No prevertebral fluid or swelling. No visible canal hematoma. Disc levels: The spinal canal is fairly generous. No significant spinal stenosis. There is moderate degenerative disc  disease at C5-6 with posterior osteophytic ridging and uncinate spurring. There is flattening of the ventral thecal sac and mild bilateral foraminal stenosis. Upper chest: Aneurysmal dilatation of the top of the aortic arch measuring 4.6 cm. The lung apices are grossly clear. Other: No neck mass or adenopathy. IMPRESSION: 1. No acute intracranial findings or mass lesions. Age advanced periventricular white matter disease. 2. No acute skull fracture. 3. Normal alignment of the cervical vertebral bodies and no acute cervical spine fracture. 4. Thoracic aortic aneurysm measuring at least 4.6 cm. Recommend chest CT with contrast for further evaluation. Electronically Signed   By: Marijo Sanes M.D.   On: 10/11/2019 10:12   Dg Knee Complete 4 Views Left  Result Date: 10/11/2019 CLINICAL DATA:  [place] yesterday at home while vacuuming, scraped her LEFT knee EXAM: LEFT KNEE - COMPLETE 4+ VIEW COMPARISON:  None FINDINGS: Osseous demineralization. Medial compartment joint space narrowing. Scattered soft tissue swelling. No acute fracture, dislocation, or bone destruction. IMPRESSION: Mild degenerative changes and regional soft tissue swelling. No acute abnormalities. Electronically Signed   By: Lavonia Dana M.D.   On: 10/11/2019 08:59   Ct Angio Chest/abd/pel For Dissection W And/or W/wo  Result Date: 10/11/2019 CLINICAL DATA:  Chest pain, possible aneurysm of aortic arch EXAM: CT ANGIOGRAPHY CHEST, ABDOMEN AND PELVIS TECHNIQUE: Multidetector CT imaging through the chest, abdomen and pelvis was performed using the standard protocol during bolus administration of intravenous contrast. Multiplanar reconstructed images and MIPs were obtained and reviewed to evaluate the vascular anatomy. CONTRAST:  160mL OMNIPAQUE IOHEXOL 350 MG/ML SOLN  COMPARISON:  2012 FINDINGS: CTA CHEST FINDINGS Cardiovascular: There is aneurysmal dilatation of the aortic arch just beyond the origin of the left subclavian artery. This extends superiorly with mural thrombus. Approximate measurements of 5.4 x 5 x 3.4 cm. There is no evidence of dissection. Calcified and noncalcified plaque along the thoracic aorta. Normal heart size. Central pulmonary arteries are patent. Mediastinum/Nodes: No enlarged lymph nodes. Lungs/Pleura: Lungs are clear. No pleural effusion or pneumothorax. Musculoskeletal: No chest wall abnormality. No acute or significant osseous findings. There Review of the MIP images confirms the above findings. CTA ABDOMEN AND PELVIS FINDINGS VASCULAR Aorta: Calcified and noncalcified plaque. Normal caliber without aneurysm or dissection. Celiac: Patent. SMA: Patent. Renals: Patent. IMA: Patent. Veins: No obvious venous abnormality within the limitations of this arterial phase study. Review of the MIP images confirms the above findings. NON-VASCULAR Hepatobiliary: A 1.4 cm partially enhancing lesion is again identified within the inferior right hepatic lobe. This was characterized as a hemangioma on prior MRI. Gallbladder is unremarkable. Pancreas: Unremarkable. Spleen: Decreased volume likely reflecting sequela of infarct on prior study. Adrenals/Urinary Tract: Bladder is obscured. Kidneys are unremarkable. Adrenals are unremarkable. Stomach/Bowel: Stomach is unremarkable. Colonic diverticulosis. Appendix is normal. Lymphatic: No enlarged lymph nodes. Reproductive: Partially obscured by artifact.  Unremarkable. Other: No abdominal wall hernia or abnormality. No abdominopelvic ascites. Musculoskeletal: Right hip arthroplasty. Review of the MIP images confirms the above findings. 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 No evidence of dissection. Electronically  Signed   By: Macy Mis M.D.   On: 10/11/2019 10:17    Procedures Procedures (including critical care time)  Medications Ordered in ED Medications  iohexol (OMNIPAQUE) 350 MG/ML injection 100 mL (100 mLs Intravenous Contrast Given 10/11/19 0935)     Initial Impression / Assessment and Plan / ED Course  I have reviewed the  triage vital signs and the nursing notes.  Pertinent labs & imaging results that were available during my care of the patient were reviewed by me and considered in my medical decision making (see chart for details).  Clinical Course as of Oct 10 1426  Mon Oct 11, 2019  1123 CTS recommendation (Dr. Orvan Seen): treatment for proximal descending aneurysm would be vascular stenting does not seem emergent; recommend consult vascular surgery; question BP management   [HK]  52 Dr. Oneida Alar of vascular surgery to evaluate at bedside.   [HK]  [id number] Patient evaluated by Dr. Oneida Alar who will follow up outpatient.   [HK]    Clinical Course User Index [HK] Delia Heady, PA-C       73 year old female with past medical history of arthritis, hypertension presents to ED for fall.  Had a mechanical fall yesterday while vacuuming her house.  Sustained a left wrist injury with deformity.  Did not want to come be evaluated in the ED yesterday.  Reports some vague chest funny feeling last night but denies pain currently.  On exam patient is overall well-appearing.  Able to move extremities without difficulty.  Deformity noted of the left wrist with edema.  Neurovascularly intact.  No deficits neurological exam.  Vital signs are within normal limits.  Work-up significant for distal radius and ulnar styloid fracture found the left wrist x-ray.  Patient evaluated by orthopedic PA at bedside who consulted hand specialist who recommended surgery in about 1 week.  Splint was placed.  CT dissection study shows large aneurysm with thrombus present.  Dr. Julien Girt of CT surgery who asked that we  consult vascular surgery.  Dr. Oneida Alar of vascular surgery evaluated patient at the bedside and felt that she is suitable for follow-up.  Other lab work knee x-ray, CT of the head and cervical spine, EKG and troponins are all unremarkable.  Patient agreeable for discharge home.  We will have her follow-up with PCP and return for worsening symptoms.  Patient is hemodynamically stable, in NAD, and able to ambulate in the ED. Evaluation does not show pathology that would require ongoing emergent intervention or inpatient treatment. I explained the diagnosis to the patient. Pain has been managed and has no complaints prior to discharge. Patient is comfortable with above plan and is stable for discharge at this time. All questions were answered prior to disposition. Strict return precautions for returning to the ED were discussed. Encouraged follow up with PCP.   An After Visit Summary was printed and given to the patient.   Portions of this note were generated with [employer]. Dictation errors may occur despite best attempts at proofreading.   Final Clinical Impressions(s) / ED Diagnoses   Final diagnoses:  Other closed fracture of distal end of left radius, initial encounter  Thoracic aortic aneurysm without rupture [facility])    ED Discharge Orders    None       Delia Heady, PA-C 10/11/19 1428    Sherwood Gambler, MD 10/12/19 1139

## 2019-10-11 NOTE — Progress Notes (Signed)
Orthopedic Tech Progress Note Patient Details:  Michaela Guerra January 10, 1946 [id number]  Ortho Devices Type of Ortho Device: Arm sling, Sugartong splint Ortho Device/Splint Location: LUE Ortho Device/Splint Interventions: Adjustment, Application, Ordered   Post Interventions Patient Tolerated: Well Instructions Provided: Care of device, Adjustment of device   [name] 10/11/2019, 11:47 AM

## 2019-10-11 NOTE — Discharge Instructions (Addendum)
Follow-up with Dr. Biagio Borg office as directed. Return to the ED if you start to experience worsening chest pain, numbness in arms or legs, shortness of breath, injuries or falls.

## 2019-10-11 NOTE — H&P (View-Only) (Signed)
Reason for Consult:Left wrist fx Referring Physician: Zaniya Shinohara is an 73 y.o. female.  HPI: Michaela Guerra was doing some housework when the vacuum cord got tangled around her legs and caused her to fall. She put out her hand to break her fall and had immediate pain. She came to the ED for evaluation and x-rays showed a wrist fx and hand surgery was consulted. She is LHD and lives alone.  Past Medical History:  Diagnosis Date  . Anxiety   . Arthritis   . 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 Breast cancer ([place])    Adenocarcinoma, radiation and surgery; at age 60  . COLONIC POLYPS, HYPERPLASTIC 07/12/2009   Qualifier: Diagnosis of  By: Amil Amen MD, Benjamine Mola    . COPD (chronic obstructive pulmonary disease) ([place])   . CVA (cerebral vascular accident) ([place]) 11/2013  . DECREASED HEARING, LEFT EAR 05/03/2010   Qualifier: Diagnosis of  By: Asa Lente MD, Jannifer Rodney   . Depression   . Derangement of anterior horn of lateral meniscus 04/24/2009   Annotation: Underwent right knee arthroscopic surgery 09/20/09: For  chondromalacia and anterolateral meniscectomy Qualifier: Diagnosis of  By: Amil Amen MD, Benjamine Mola    . GERD (gastroesophageal reflux disease)   . Hyperlipidemia   . Hypertension   . Hypothyroidism   . Polio    Dx at age 29 yrs  . Stroke [facility]) 11/2013    Past Surgical History:  Procedure Laterality Date  . BREAST SURGERY    . TOTAL HIP ARTHROPLASTY Right 10/04/2014   Procedure: TOTAL HIP ARTHROPLASTY ANTERIOR APPROACH;  Surgeon: Mcarthur Rossetti, MD;  Location: [place];  Service: Orthopedics;  Laterality: Right;    Family History  Problem Relation Age of Onset  . Breast cancer Mother   . Stroke Mother   . Stroke Father   . Breast cancer Sister   . Hypertension Sister   . Hypertension Sister   . Dementia Sister     Social History:  reports that she has been smoking. She has a 40.00 pack-year smoking history. She has never used smokeless tobacco. She reports  current alcohol use. She reports that she does not use drugs.  Allergies:  Allergies  Allergen Reactions  . Statins Other (See Comments)    Myalgias and fatigue  . Codeine     REACTION: causes skin to feel like it's crawling  . Levofloxacin     REACTION: nausea    Medications: I have reviewed the patient's current medications.  Results for orders placed or performed during the hospital encounter of 10/11/19 (from the past 48 hour(s))  Basic metabolic panel     Status: Abnormal   Collection Time: 10/11/19  8:35 AM  Result Value Ref Range   Sodium 136 135 - 145 mmol/L   Potassium 3.5 3.5 - 5.1 mmol/L   Chloride 102 98 - 111 mmol/L   CO2 23 22 - 32 mmol/L   Glucose, Bld 115 (H) 70 - 99 mg/dL   BUN 8 8 - 23 mg/dL   Creatinine, Ser 0.68 0.44 - 1.00 mg/dL   Calcium 8.5 (L) 8.9 - 10.3 mg/dL   GFR calc non Af Amer >60 >60 mL/min   GFR calc Af Amer >60 >60 mL/min   Anion gap 11 5 - 15    Comment: Performed at [facility], [place] [address], [place], [place] [address]  CBC with Differential     Status: None   Collection Time:  10/11/19  8:35 AM  Result Value Ref Range   WBC 8.3 4.0 - 10.5 K/uL   RBC 4.25 3.87 - 5.11 MIL/uL   Hemoglobin 13.1 12.0 - 15.0 g/dL   HCT 39.9 36.0 - 46.0 %   MCV 93.9 80.0 - 100.0 fL   MCH 30.8 26.0 - 34.0 pg   MCHC 32.8 30.0 - 36.0 g/dL   RDW 14.0 11.5 - 15.5 %   Platelets 206 150 - 400 K/uL   nRBC 0.0 0.0 - 0.2 %   Neutrophils Relative % 72 %   Neutro Abs 6.1 1.7 - 7.7 K/uL   Lymphocytes Relative 18 %   Lymphs Abs 1.5 0.7 - 4.0 K/uL   Monocytes Relative 8 %   Monocytes Absolute 0.6 0.1 - 1.0 K/uL   Eosinophils Relative 1 %   Eosinophils Absolute 0.1 0.0 - 0.5 K/uL   Basophils Relative 1 %   Basophils Absolute 0.1 0.0 - 0.1 K/uL   Immature Granulocytes 0 %   Abs Immature Granulocytes 0.02 0.00 - 0.07 K/uL    Comment: Performed at [facility], [address] [place], [place] [address]  Troponin I (High Sensitivity)     Status: None    Collection Time: 10/11/19  8:35 AM  Result Value Ref Range   Troponin I (High Sensitivity) 3 <18 ng/L    Comment: (NOTE) Elevated high sensitivity troponin I (hsTnI) values and significant  changes across serial measurements may suggest ACS but many other  chronic and acute conditions are known to elevate hsTnI results.  Refer to the "Links" section for chest pain algorithms and additional  guidance. Performed at [facility], [place] [address] [place],  [address]     Dg Chest 2 View  Result Date: 10/11/2019 CLINICAL DATA:  Several recent falls.  Hypertension. EXAM: CHEST - 2 VIEW COMPARISON:  February 05, 2016 FINDINGS: Lungs are somewhat hyperexpanded. There is no appreciable edema or consolidation. Heart is mildly enlarged with pulmonary vascularity normal. No adenopathy. There is aortic atherosclerosis. There is prominence in the aortic arch region with questionable aneurysmal dilatation in this area. No adenopathy evident.  No bone lesions.  No pneumothorax. IMPRESSION: Question aneurysmal dilatation in the region of the aortic arch. It may be prudent to correlate with contrast enhanced chest CT to further assess. The aorta is more prominent than on the prior study in this region. Lungs hyperexpanded. No edema or consolidation. Stable cardiac prominence. No adenopathy evident. Electronically Signed   By: Lowella Grip III M.D.   On: 10/11/2019 09:02   Dg Wrist Complete Left  Result Date: 10/11/2019 CLINICAL DATA:  Status post fall. EXAM: LEFT WRIST - COMPLETE 3+ VIEW COMPARISON:  6/20/8 FINDINGS: Comminuted fracture deformity involving the distal aspect of the radius is identified. There is dorsal angulation of the distal fracture fragments. Impacted fracture of the ulnar styloid is also noted. IMPRESSION: 1. Acute, comminuted fracture involves the distal radius. Dorsal angulation of the distal fracture fragments. 2. Ulnar styloid fracture. Electronically Signed   By: Kerby Moors M.D.   On: 10/11/2019 08:58   Ct Head Wo Contrast  Result Date: 10/11/2019 CLINICAL DATA:  [place] yesterday and hit head. EXAM: CT HEAD WITHOUT CONTRAST CT CERVICAL SPINE WITHOUT CONTRAST TECHNIQUE: Multidetector CT imaging of the head and cervical spine was performed following the standard protocol without intravenous contrast. Multiplanar CT image reconstructions of the cervical spine were also generated. COMPARISON:  MRI brain 06/22/2015 FINDINGS: CT HEAD FINDINGS  Brain: The ventricles are in the midline without mass effect or shift. They are normal in size and configuration for the patient's age and degree of cerebral atrophy. There is fairly advanced and extensive periventricular white matter disease. No acute intracranial findings such as hemispheric infarction or intracranial hemorrhage. No extra-axial fluid collections are identified. The brainstem and cerebellum appear normal. Vascular: Vascular calcifications but no definite aneurysm or hyperdense vessels. Skull: No skull fracture or bone lesions. Diffusely thickened calvarium appears stable. Sinuses/Orbits: The paranasal sinuses and mastoid air cells are clear. Other: No scalp lesions or hematoma. CT CERVICAL SPINE FINDINGS Alignment: Normal. Skull base and vertebrae: No acute fracture. No primary bone lesion or focal pathologic process. Soft tissues and spinal canal: No prevertebral fluid or swelling. No visible canal hematoma. Disc levels: The spinal canal is fairly generous. No significant spinal stenosis. There is moderate degenerative disc disease at C5-6 with posterior osteophytic ridging and uncinate spurring. There is flattening of the ventral thecal sac and mild bilateral foraminal stenosis. Upper chest: Aneurysmal dilatation of the top of the aortic arch measuring 4.6 cm. The lung apices are grossly clear. Other: No neck mass or adenopathy. IMPRESSION: 1. No acute intracranial findings or mass lesions. Age advanced periventricular white  matter disease. 2. No acute skull fracture. 3. Normal alignment of the cervical vertebral bodies and no acute cervical spine fracture. 4. Thoracic aortic aneurysm measuring at least 4.6 cm. Recommend chest CT with contrast for further evaluation. Electronically Signed   By: Marijo Sanes M.D.   On: 10/11/2019 10:12   Ct Cervical Spine Wo Contrast  Result Date: 10/11/2019 CLINICAL DATA:  [place] yesterday and hit head. EXAM: CT HEAD WITHOUT CONTRAST CT CERVICAL SPINE WITHOUT CONTRAST TECHNIQUE: Multidetector CT imaging of the head and cervical spine was performed following the standard protocol without intravenous contrast. Multiplanar CT image reconstructions of the cervical spine were also generated. COMPARISON:  MRI brain 06/22/2015 FINDINGS: CT HEAD FINDINGS Brain: The ventricles are in the midline without mass effect or shift. They are normal in size and configuration for the patient's age and degree of cerebral atrophy. There is fairly advanced and extensive periventricular white matter disease. No acute intracranial findings such as hemispheric infarction or intracranial hemorrhage. No extra-axial fluid collections are identified. The brainstem and cerebellum appear normal. Vascular: Vascular calcifications but no definite aneurysm or hyperdense vessels. Skull: No skull fracture or bone lesions. Diffusely thickened calvarium appears stable. Sinuses/Orbits: The paranasal sinuses and mastoid air cells are clear. Other: No scalp lesions or hematoma. CT CERVICAL SPINE FINDINGS Alignment: Normal. Skull base and vertebrae: No acute fracture. No primary bone lesion or focal pathologic process. Soft tissues and spinal canal: No prevertebral fluid or swelling. No visible canal hematoma. Disc levels: The spinal canal is fairly generous. No significant spinal stenosis. There is moderate degenerative disc disease at C5-6 with posterior osteophytic ridging and uncinate spurring. There is flattening of the ventral thecal  sac and mild bilateral foraminal stenosis. Upper chest: Aneurysmal dilatation of the top of the aortic arch measuring 4.6 cm. The lung apices are grossly clear. Other: No neck mass or adenopathy. IMPRESSION: 1. No acute intracranial findings or mass lesions. Age advanced periventricular white matter disease. 2. No acute skull fracture. 3. Normal alignment of the cervical vertebral bodies and no acute cervical spine fracture. 4. Thoracic aortic aneurysm measuring at least 4.6 cm. Recommend chest CT with contrast for further evaluation. Electronically Signed   By: Marijo Sanes M.D.   On: 10/11/2019 10:12  Dg Knee Complete 4 Views Left  Result Date: 10/11/2019 CLINICAL DATA:  [place] yesterday at home while vacuuming, scraped her LEFT knee EXAM: LEFT KNEE - COMPLETE 4+ VIEW COMPARISON:  None FINDINGS: Osseous demineralization. Medial compartment joint space narrowing. Scattered soft tissue swelling. No acute fracture, dislocation, or bone destruction. IMPRESSION: Mild degenerative changes and regional soft tissue swelling. No acute abnormalities. Electronically Signed   By: Lavonia Dana M.D.   On: 10/11/2019 08:59   Ct Angio Chest/abd/pel For Dissection W And/or W/wo  Result Date: 10/11/2019 CLINICAL DATA:  Chest pain, possible aneurysm of aortic arch EXAM: CT ANGIOGRAPHY CHEST, ABDOMEN AND PELVIS TECHNIQUE: Multidetector CT imaging through the chest, abdomen and pelvis was performed using the standard protocol during bolus administration of intravenous contrast. Multiplanar reconstructed images and MIPs were obtained and reviewed to evaluate the vascular anatomy. CONTRAST:  184mL OMNIPAQUE IOHEXOL 350 MG/ML SOLN COMPARISON:  2012 FINDINGS: CTA CHEST FINDINGS Cardiovascular: There is aneurysmal dilatation of the aortic arch just beyond the origin of the left subclavian artery. This extends superiorly with mural thrombus. Approximate measurements of 5.4 x 5 x 3.4 cm. There is no evidence of dissection. Calcified  and noncalcified plaque along the thoracic aorta. Normal heart size. Central pulmonary arteries are patent. Mediastinum/Nodes: No enlarged lymph nodes. Lungs/Pleura: Lungs are clear. No pleural effusion or pneumothorax. Musculoskeletal: No chest wall abnormality. No acute or significant osseous findings. There Review of the MIP images confirms the above findings. CTA ABDOMEN AND PELVIS FINDINGS VASCULAR Aorta: Calcified and noncalcified plaque. Normal caliber without aneurysm or dissection. Celiac: Patent. SMA: Patent. Renals: Patent. IMA: Patent. Veins: No obvious venous abnormality within the limitations of this arterial phase study. Review of the MIP images confirms the above findings. NON-VASCULAR Hepatobiliary: A 1.4 cm partially enhancing lesion is again identified within the inferior right hepatic lobe. This was characterized as a hemangioma on prior MRI. Gallbladder is unremarkable. Pancreas: Unremarkable. Spleen: Decreased volume likely reflecting sequela of infarct on prior study. Adrenals/Urinary Tract: Bladder is obscured. Kidneys are unremarkable. Adrenals are unremarkable. Stomach/Bowel: Stomach is unremarkable. Colonic diverticulosis. Appendix is normal. Lymphatic: No enlarged lymph nodes. Reproductive: Partially obscured by artifact.  Unremarkable. Other: No abdominal wall hernia or abnormality. No abdominopelvic ascites. Musculoskeletal: Right hip arthroplasty. Review of the MIP images confirms the above findings. 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 No evidence of dissection. Electronically Signed   By: Macy Mis M.D.   On: 10/11/2019 10:17    Review of Systems  Constitutional: Negative for weight loss.  HENT: Negative for ear discharge, ear pain, hearing loss and tinnitus.   Eyes: Negative for blurred vision, double vision, photophobia and pain.  Respiratory: Negative for cough,  sputum production and shortness of breath.   Cardiovascular: Negative for chest pain.  Gastrointestinal: Negative for abdominal pain, nausea and vomiting.  Genitourinary: Negative for dysuria, flank pain, frequency and urgency.  Musculoskeletal: Positive for joint pain (Left wrist). Negative for back pain, falls, myalgias and neck pain.  Neurological: Negative for dizziness, tingling, sensory change, focal weakness, loss of consciousness and headaches.  Endo/Heme/Allergies: Does not bruise/bleed easily.  Psychiatric/Behavioral: Negative for depression, memory loss and substance abuse. The patient is not nervous/anxious.    Blood pressure (!) 189/96, pulse 60, temperature 98.2 F (36.8 C), temperature source Oral, resp. rate (!) 23, height 5\' 5"  (1.651 m), weight 70.3 kg, SpO2 96 %. Physical Exam  Constitutional: She appears  well-developed and well-nourished. No distress.  HENT:  Head: Normocephalic and atraumatic.  Eyes: Conjunctivae are normal. Right eye exhibits no discharge. Left eye exhibits no discharge. No scleral icterus.  Neck: Normal range of motion.  Cardiovascular: Normal rate and regular rhythm.  Respiratory: Effort normal. No respiratory distress.  Musculoskeletal:     Comments: Left shoulder, elbow, wrist, digits- no skin wounds, wrist mod TTP, mild deformity, no instability, no blocks to motion  Sens  Ax/R/M/U intact  Mot   Ax/ R/ PIN/ M/ AIN/ U intact  Rad 2+  Neurological: She is alert.  Skin: Skin is warm and dry. She is not diaphoretic.  Psychiatric: She has a normal mood and affect. Her behavior is normal.    Assessment/Plan: Left wrist fx -- Will plan elective ORIF by Dr. Grandville Silos in 1 week. Sugar tong splint and NWB until then.    Lisette Abu, PA-C Orthopedic Surgery [phone] 10/11/2019, 10:25 AM    S/p mechanical fall yesterday, with pain, swelling, deformity about left wrist. NVI LUE, no TTP about elbow xrays-comminuted left distal radius fx  with dorsal tilt and shortening Recs:  D/w patient these findings, and my preference for ORIF around 7-10 days from injury as optimal time. ST splint in ED, with f/u in the office later this week, likely Thursday, and planned ORIF on Monday at [facility].  Micheline Rough, MD Hand Surgery [facility] [phone]

## 2019-10-11 NOTE — ED Notes (Signed)
Ortho Tech at bedside.  

## 2019-10-11 NOTE — Progress Notes (Signed)
Patient's chart and recent CT scan showing an aortic aneurysm was reviewed by Dr Marcell Barlow. OK for [facility].

## 2019-10-11 NOTE — ED Triage Notes (Signed)
Pt BIB [facility] from home. Pt had a fall yesterday when she was vacuuming. Pt caught herself with her left wrist and scraped her left knee. Pt also reports hitting her cheek on her couch. No loss of consciousness. Pt did go to urgent care yesterday but they did not do any xrays due to her possibly hitting her head and wanting her to come to the ED instead. Pt also reporting some dizziness. EMS reports BP of 214/100 initially. Pt does have history of hypertension.

## 2019-10-11 NOTE — Consult Note (Signed)
Referring Physician: [place]  Patient name: Michaela Guerra MRN: [id number] DOB: [birth year] Sex: female  REASON FOR CONSULT: Thoracic aortic aneurysm  HPI: Michaela Guerra is a 73 y.o. female, who had a ground-level fall earlier today.  As part of her work-up she had a CT scan of the chest abdomen and pelvis.  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  She currently has no chest pain.  She has had no hemodynamic instability.  Her only other injury is a left wrist fracture.  She states she did not have any chest pain prior to or after her fall.  She denies any prior history of significant motor vehicle accident.  She has no family history of aneurysms.  She states she may have some type of prolonged bleeding disorder but does not know the details regarding this.  She does currently smoke.  Other medical problems include history of breast cancer, COPD, hyperlipidemia, hypertension all of which are stable.  Past Medical History:  Diagnosis Date   Anxiety    Arthritis    Bleeding diathesis ([place])    Hx of    Breast cancer (HCC)    Adenocarcinoma, radiation and surgery; at age 56   COLONIC POLYPS, HYPERPLASTIC 07/12/2009   Qualifier: Diagnosis of  By: Amil Amen MD, Elizabeth     COPD (chronic obstructive pulmonary disease) ([place])    CVA (cerebral vascular accident) ([place]) 11/2013   DECREASED HEARING, LEFT EAR 05/03/2010   Qualifier: Diagnosis of  By: Asa Lente MD, Jannifer Rodney    Depression    Derangement of anterior horn of lateral meniscus 04/24/2009   Annotation: Underwent right knee arthroscopic surgery 09/20/09: For  chondromalacia and anterolateral meniscectomy Qualifier: Diagnosis of  By: Amil Amen MD, Benjamine Mola     GERD (gastroesophageal reflux disease)    Hyperlipidemia    Hypertension    Hypothyroidism    Polio    Dx at age 35 yrs   Stroke ([facility]) 11/2013   Past Surgical History:  Procedure Laterality Date   BREAST  SURGERY     TOTAL HIP ARTHROPLASTY Right 10/04/2014   Procedure: TOTAL HIP ARTHROPLASTY ANTERIOR APPROACH;  Surgeon: Mcarthur Rossetti, MD;  Location: [facility];  Service: Orthopedics;  Laterality: Right;    Family History  Problem Relation Age of Onset   Breast cancer Mother    Stroke Mother    Stroke Father    Breast cancer Sister    Hypertension Sister    Hypertension Sister    Dementia Sister     SOCIAL HISTORY: Social History   Socioeconomic History   Marital status: Single    Spouse name: Not on file   Number of children: 5   Years of education: Not on file   Highest education level: Not on file  Occupational History   Occupation: Retired  Scientist, product/process development strain: Not on file   Food insecurity    Worry: Not on file    Inability: Not on Lexicographer needs    Medical: Not on file    Non-medical: Not on file  Tobacco Use   Smoking status: Current Every Day Smoker    Packs/day: 1.00    Years: 40.00    Pack years: 40.00   Smokeless tobacco: Never Used   Tobacco comment: Has cut back to 1 ppd she is aware she needs to quit   Substance and Sexual Activity  Alcohol use: Yes    Alcohol/week: 0.0 standard drinks    Comment: once every two weeks   Drug use: No   Sexual activity: Never  Lifestyle   Physical activity    Days per week: Not on file    Minutes per session: Not on file   Stress: Not on file  Relationships   Social connections    Talks on phone: Not on file    Gets together: Not on file    Attends religious service: Not on file    Active member of club or organization: Not on file    Attends meetings of clubs or organizations: Not on file    Relationship status: Not on file   Intimate partner violence    Fear of current or ex partner: Not on file    Emotionally abused: Not on file    Physically abused: Not on file    Forced sexual activity: Not on file  Other Topics Concern   Not on file    Social History Narrative   Single, Lives alone.     5 daughters: 42 grandchildren, 1 great grandson   Retired   Menopause at 50 yrs   Last mammogram 08/2014    Allergies  Allergen Reactions   Statins Other (See Comments)    Myalgias and fatigue   Codeine     REACTION: causes skin to feel like it's crawling   Levofloxacin     REACTION: nausea    No current facility-administered medications for this encounter.    Current Outpatient Medications  Medication Sig Dispense Refill   aspirin 81 MG tablet Take 81 mg by mouth daily.     carvedilol (COREG) 6.25 MG tablet Take 1 tablet by mouth 2 (two) times daily.     ezetimibe (ZETIA) 10 MG tablet Take 1 tablet by mouth daily.     fluticasone (FLONASE) 50 MCG/ACT nasal spray Place 1 spray into the nose as needed.     levothyroxine (SYNTHROID, LEVOTHROID) 75 MCG tablet Take 75 mcg by mouth daily.  0    ROS:   General:  No weight loss, Fever, chills  HEENT: No recent headaches, no nasal bleeding, no visual changes, no sore throat  Neurologic: No dizziness, blackouts, seizures. No recent symptoms of stroke or mini- stroke. No recent episodes of slurred speech, or temporary blindness.  Cardiac: No recent episodes of chest pain/pressure, no shortness of breath at rest.  + shortness of breath with exertion.  Denies history of atrial fibrillation or irregular heartbeat  Vascular: No history of rest pain in feet.  No history of claudication.  No history of non-healing ulcer, No history of DVT   Pulmonary: No home oxygen, + productive cough, no hemoptysis,  No asthma or wheezing  Musculoskeletal:  [ ]  Arthritis, [ ]  Low back pain,  [ ]  Joint pain  Hematologic:No history of hypercoagulable state.  No history of easy bleeding.  No history of anemia  Gastrointestinal: No hematochezia or melena,  No gastroesophageal reflux, no trouble swallowing  Urinary: [ ]  chronic Kidney disease, [ ]  on HD - [ ]  MWF or [ ]  TTHS, [ ]  Burning with  urination, [ ]  Frequent urination, [ ]  Difficulty urinating;   Skin: No rashes  Psychological: No history of anxiety,  No history of depression   Physical Examination  Vitals:   10/11/19 1145 10/11/19 1200 10/11/19 1215 10/11/19 1231  BP:    (!) 159/84  Pulse: 60 67 63   Resp:  20 (!) 26 (!) 23   Temp:      TempSrc:      SpO2: 95% 95% 95%   Weight:      Height:        Body mass index is 25.79 kg/m.  General:  Alert and oriented, no acute distress HEENT: Normal Neck: No JVD Pulmonary: Clear to auscultation bilaterally Cardiac: Regular Rate and Rhythm  Abdomen: Soft, non-tender, non-distended, no mass Skin: No rash Extremity Pulses:  2+ femoral, dorsalis pedis, posterior tibial pulses bilaterally, left arm in cast and sling Musculoskeletal: No deformity or edema  Neurologic: Upper and lower extremity motor 5/5 and symmetric  DATA:  CT angio of the chest abdomen and pelvis was reviewed.  There is a 5 cm aortic aneurysm adjacent to the left subclavian artery which extends over a few centimeters in length.  It is fairly focal.  The aorta is fairly normal diameter below this.  The ascending aorta is slightly dilated.  The infrarenal abdominal aorta and visceral aorta is normal caliber.  No other evidence of other aneurysms.  There is no iliac aneurysm.  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  Most likely not related to her ground-level fall.   PLAN: We will schedule the patient for an outpatient office visit to see me in 1 month with a repeat CT scan at that time.  If overall the size and diameter is stable.  The most likely we will repeat her CT scan again after that in 6 months.  Discussed with the patient risk of rupture of aneurysm.  Also signs and symptoms of aneurysm rupture.   Ruta Hinds, MD [facility] Office: [phone] Pager: [phone]

## 2019-10-11 NOTE — Consult Note (Addendum)
Reason for Consult:Left wrist fx Referring Physician: [name] is an 73 y.o. female.  HPI: Michaela Guerra was doing some housework when the vacuum cord got tangled around her legs and caused her to fall. She put out her hand to break her fall and had immediate pain. She came to the ED for evaluation and x-rays showed a wrist fx and hand surgery was consulted. She is LHD and lives alone.  Past Medical History:  Diagnosis Date  . Anxiety   . Arthritis   . Bleeding diathesis ([place])    Hx of   . Breast cancer ([place])    Adenocarcinoma, radiation and surgery; at age 80  . COLONIC POLYPS, HYPERPLASTIC 07/12/2009   Qualifier: Diagnosis of  By: Amil Amen MD, Benjamine Mola    . COPD (chronic obstructive pulmonary disease) ([place])   . CVA (cerebral vascular accident) ([place]) 11/2013  . DECREASED HEARING, LEFT EAR 05/03/2010   Qualifier: Diagnosis of  By: Asa Lente MD, Jannifer Rodney   . Depression   . Derangement of anterior horn of lateral meniscus 04/24/2009   Annotation: Underwent right knee arthroscopic surgery 09/20/09: For  chondromalacia and anterolateral meniscectomy Qualifier: Diagnosis of  By: Amil Amen MD, Benjamine Mola    . GERD (gastroesophageal reflux disease)   . Hyperlipidemia   . Hypertension   . Hypothyroidism   . Polio    Dx at age 50 yrs  . Stroke [facility]) 11/2013    Past Surgical History:  Procedure Laterality Date  . BREAST SURGERY    . TOTAL HIP ARTHROPLASTY Right 10/04/2014   Procedure: TOTAL HIP ARTHROPLASTY ANTERIOR APPROACH;  Surgeon: Mcarthur Rossetti, MD;  Location: [place];  Service: Orthopedics;  Laterality: Right;    Family History  Problem Relation Age of Onset  . Breast cancer Mother   . Stroke Mother   . Stroke Father   . Breast cancer Sister   . Hypertension Sister   . Hypertension Sister   . Dementia Sister     Social History:  reports that she has been smoking. She has a 40.00 pack-year smoking history. She has never used smokeless tobacco. She reports  current alcohol use. She reports that she does not use drugs.  Allergies:  Allergies  Allergen Reactions  . Statins Other (See Comments)    Myalgias and fatigue  . Codeine     REACTION: causes skin to feel like it's crawling  . Levofloxacin     REACTION: nausea    Medications: I have reviewed the patient's current medications.  Results for orders placed or performed during the hospital encounter of 10/11/19 (from the past 48 hour(s))  Basic metabolic panel     Status: Abnormal   Collection Time: 10/11/19  8:35 AM  Result Value Ref Range   Sodium 136 135 - 145 mmol/L   Potassium 3.5 3.5 - 5.1 mmol/L   Chloride 102 98 - 111 mmol/L   CO2 23 22 - 32 mmol/L   Glucose, Bld 115 (H) 70 - 99 mg/dL   BUN 8 8 - 23 mg/dL   Creatinine, Ser 0.68 0.44 - 1.00 mg/dL   Calcium 8.5 (L) 8.9 - 10.3 mg/dL   GFR calc non Af Amer >60 >60 mL/min   GFR calc Af Amer >60 >60 mL/min   Anion gap 11 5 - 15    Comment: Performed at [facility], [place] [address], [place], [place] [address]  CBC with Differential     Status: None   Collection Time:  10/11/19  8:35 AM  Result Value Ref Range   WBC 8.3 4.0 - 10.5 K/uL   RBC 4.25 3.87 - 5.11 MIL/uL   Hemoglobin 13.1 12.0 - 15.0 g/dL   HCT 39.9 36.0 - 46.0 %   MCV 93.9 80.0 - 100.0 fL   MCH 30.8 26.0 - 34.0 pg   MCHC 32.8 30.0 - 36.0 g/dL   RDW 14.0 11.5 - 15.5 %   Platelets 206 150 - 400 K/uL   nRBC 0.0 0.0 - 0.2 %   Neutrophils Relative % 72 %   Neutro Abs 6.1 1.7 - 7.7 K/uL   Lymphocytes Relative 18 %   Lymphs Abs 1.5 0.7 - 4.0 K/uL   Monocytes Relative 8 %   Monocytes Absolute 0.6 0.1 - 1.0 K/uL   Eosinophils Relative 1 %   Eosinophils Absolute 0.1 0.0 - 0.5 K/uL   Basophils Relative 1 %   Basophils Absolute 0.1 0.0 - 0.1 K/uL   Immature Granulocytes 0 %   Abs Immature Granulocytes 0.02 0.00 - 0.07 K/uL    Comment: Performed at [facility], [address] [place], [place] [address]  Troponin I (High Sensitivity)     Status: None    Collection Time: 10/11/19  8:35 AM  Result Value Ref Range   Troponin I (High Sensitivity) 3 <18 ng/L    Comment: (NOTE) Elevated high sensitivity troponin I (hsTnI) values and significant  changes across serial measurements may suggest ACS but many other  chronic and acute conditions are known to elevate hsTnI results.  Refer to the "Links" section for chest pain algorithms and additional  guidance. Performed at [facility], [facility] [address], [place], [place] [address]     Dg Chest 2 View  Result Date: 10/11/2019 CLINICAL DATA:  Several recent falls.  Hypertension. EXAM: CHEST - 2 VIEW COMPARISON:  February 05, 2016 FINDINGS: Lungs are somewhat hyperexpanded. There is no appreciable edema or consolidation. Heart is mildly enlarged with pulmonary vascularity normal. No adenopathy. There is aortic atherosclerosis. There is prominence in the aortic arch region with questionable aneurysmal dilatation in this area. No adenopathy evident.  No bone lesions.  No pneumothorax. IMPRESSION: Question aneurysmal dilatation in the region of the aortic arch. It may be prudent to correlate with contrast enhanced chest CT to further assess. The aorta is more prominent than on the prior study in this region. Lungs hyperexpanded. No edema or consolidation. Stable cardiac prominence. No adenopathy evident. Electronically Signed   By: Lowella Grip III M.D.   On: 10/11/2019 09:02   Dg Wrist Complete Left  Result Date: 10/11/2019 CLINICAL DATA:  Status post fall. EXAM: LEFT WRIST - COMPLETE 3+ VIEW COMPARISON:  6/20/8 FINDINGS: Comminuted fracture deformity involving the distal aspect of the radius is identified. There is dorsal angulation of the distal fracture fragments. Impacted fracture of the ulnar styloid is also noted. IMPRESSION: 1. Acute, comminuted fracture involves the distal radius. Dorsal angulation of the distal fracture fragments. 2. Ulnar styloid fracture. Electronically Signed   By: Kerby Moors M.D.   On: 10/11/2019 08:58   Ct Head Wo Contrast  Result Date: 10/11/2019 CLINICAL DATA:  [place] yesterday and hit head. EXAM: CT HEAD WITHOUT CONTRAST CT CERVICAL SPINE WITHOUT CONTRAST TECHNIQUE: Multidetector CT imaging of the head and cervical spine was performed following the standard protocol without intravenous contrast. Multiplanar CT image reconstructions of the cervical spine were also generated. COMPARISON:  MRI brain 06/22/2015 FINDINGS: CT HEAD FINDINGS  Brain: The ventricles are in the midline without mass effect or shift. They are normal in size and configuration for the patient's age and degree of cerebral atrophy. There is fairly advanced and extensive periventricular white matter disease. No acute intracranial findings such as hemispheric infarction or intracranial hemorrhage. No extra-axial fluid collections are identified. The brainstem and cerebellum appear normal. Vascular: Vascular calcifications but no definite aneurysm or hyperdense vessels. Skull: No skull fracture or bone lesions. Diffusely thickened calvarium appears stable. Sinuses/Orbits: The paranasal sinuses and mastoid air cells are clear. Other: No scalp lesions or hematoma. CT CERVICAL SPINE FINDINGS Alignment: Normal. Skull base and vertebrae: No acute fracture. No primary bone lesion or focal pathologic process. Soft tissues and spinal canal: No prevertebral fluid or swelling. No visible canal hematoma. Disc levels: The spinal canal is fairly generous. No significant spinal stenosis. There is moderate degenerative disc disease at C5-6 with posterior osteophytic ridging and uncinate spurring. There is flattening of the ventral thecal sac and mild bilateral foraminal stenosis. Upper chest: Aneurysmal dilatation of the top of the aortic arch measuring 4.6 cm. The lung apices are grossly clear. Other: No neck mass or adenopathy. IMPRESSION: 1. No acute intracranial findings or mass lesions. Age advanced periventricular white  matter disease. 2. No acute skull fracture. 3. Normal alignment of the cervical vertebral bodies and no acute cervical spine fracture. 4. Thoracic aortic aneurysm measuring at least 4.6 cm. Recommend chest CT with contrast for further evaluation. Electronically Signed   By: Marijo Sanes M.D.   On: 10/11/2019 10:12   Ct Cervical Spine Wo Contrast  Result Date: 10/11/2019 CLINICAL DATA:  [place] yesterday and hit head. EXAM: CT HEAD WITHOUT CONTRAST CT CERVICAL SPINE WITHOUT CONTRAST TECHNIQUE: Multidetector CT imaging of the head and cervical spine was performed following the standard protocol without intravenous contrast. Multiplanar CT image reconstructions of the cervical spine were also generated. COMPARISON:  MRI brain 06/22/2015 FINDINGS: CT HEAD FINDINGS Brain: The ventricles are in the midline without mass effect or shift. They are normal in size and configuration for the patient's age and degree of cerebral atrophy. There is fairly advanced and extensive periventricular white matter disease. No acute intracranial findings such as hemispheric infarction or intracranial hemorrhage. No extra-axial fluid collections are identified. The brainstem and cerebellum appear normal. Vascular: Vascular calcifications but no definite aneurysm or hyperdense vessels. Skull: No skull fracture or bone lesions. Diffusely thickened calvarium appears stable. Sinuses/Orbits: The paranasal sinuses and mastoid air cells are clear. Other: No scalp lesions or hematoma. CT CERVICAL SPINE FINDINGS Alignment: Normal. Skull base and vertebrae: No acute fracture. No primary bone lesion or focal pathologic process. Soft tissues and spinal canal: No prevertebral fluid or swelling. No visible canal hematoma. Disc levels: The spinal canal is fairly generous. No significant spinal stenosis. There is moderate degenerative disc disease at C5-6 with posterior osteophytic ridging and uncinate spurring. There is flattening of the ventral thecal  sac and mild bilateral foraminal stenosis. Upper chest: Aneurysmal dilatation of the top of the aortic arch measuring 4.6 cm. The lung apices are grossly clear. Other: No neck mass or adenopathy. IMPRESSION: 1. No acute intracranial findings or mass lesions. Age advanced periventricular white matter disease. 2. No acute skull fracture. 3. Normal alignment of the cervical vertebral bodies and no acute cervical spine fracture. 4. Thoracic aortic aneurysm measuring at least 4.6 cm. Recommend chest CT with contrast for further evaluation. Electronically Signed   By: Marijo Sanes M.D.   On: 10/11/2019 10:12  Dg Knee Complete 4 Views Left  Result Date: 10/11/2019 CLINICAL DATA:  [place] yesterday at home while vacuuming, scraped her LEFT knee EXAM: LEFT KNEE - COMPLETE 4+ VIEW COMPARISON:  None FINDINGS: Osseous demineralization. Medial compartment joint space narrowing. Scattered soft tissue swelling. No acute fracture, dislocation, or bone destruction. IMPRESSION: Mild degenerative changes and regional soft tissue swelling. No acute abnormalities. Electronically Signed   By: Lavonia Dana M.D.   On: 10/11/2019 08:59   Ct Angio Chest/abd/pel For Dissection W And/or W/wo  Result Date: 10/11/2019 CLINICAL DATA:  Chest pain, possible aneurysm of aortic arch EXAM: CT ANGIOGRAPHY CHEST, ABDOMEN AND PELVIS TECHNIQUE: Multidetector CT imaging through the chest, abdomen and pelvis was performed using the standard protocol during bolus administration of intravenous contrast. Multiplanar reconstructed images and MIPs were obtained and reviewed to evaluate the vascular anatomy. CONTRAST:  170mL OMNIPAQUE IOHEXOL 350 MG/ML SOLN COMPARISON:  2012 FINDINGS: CTA CHEST FINDINGS Cardiovascular: There is aneurysmal dilatation of the aortic arch just beyond the origin of the left subclavian artery. This extends superiorly with mural thrombus. Approximate measurements of 5.4 x 5 x 3.4 cm. There is no evidence of dissection. Calcified  and noncalcified plaque along the thoracic aorta. Normal heart size. Central pulmonary arteries are patent. Mediastinum/Nodes: No enlarged lymph nodes. Lungs/Pleura: Lungs are clear. No pleural effusion or pneumothorax. Musculoskeletal: No chest wall abnormality. No acute or significant osseous findings. There Review of the MIP images confirms the above findings. CTA ABDOMEN AND PELVIS FINDINGS VASCULAR Aorta: Calcified and noncalcified plaque. Normal caliber without aneurysm or dissection. Celiac: Patent. SMA: Patent. Renals: Patent. IMA: Patent. Veins: No obvious venous abnormality within the limitations of this arterial phase study. Review of the MIP images confirms the above findings. NON-VASCULAR Hepatobiliary: A 1.4 cm partially enhancing lesion is again identified within the inferior right hepatic lobe. This was characterized as a hemangioma on prior MRI. Gallbladder is unremarkable. Pancreas: Unremarkable. Spleen: Decreased volume likely reflecting sequela of infarct on prior study. Adrenals/Urinary Tract: Bladder is obscured. Kidneys are unremarkable. Adrenals are unremarkable. Stomach/Bowel: Stomach is unremarkable. Colonic diverticulosis. Appendix is normal. Lymphatic: No enlarged lymph nodes. Reproductive: Partially obscured by artifact.  Unremarkable. Other: No abdominal wall hernia or abnormality. No abdominopelvic ascites. Musculoskeletal: Right hip arthroplasty. Review of the MIP images confirms the above findings. 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 No evidence of dissection. Electronically Signed   By: Macy Mis M.D.   On: 10/11/2019 10:17    Review of Systems  Constitutional: Negative for weight loss.  HENT: Negative for ear discharge, ear pain, hearing loss and tinnitus.   Eyes: Negative for blurred vision, double vision, photophobia and pain.  Respiratory: Negative for cough,  sputum production and shortness of breath.   Cardiovascular: Negative for chest pain.  Gastrointestinal: Negative for abdominal pain, nausea and vomiting.  Genitourinary: Negative for dysuria, flank pain, frequency and urgency.  Musculoskeletal: Positive for joint pain (Left wrist). Negative for back pain, falls, myalgias and neck pain.  Neurological: Negative for dizziness, tingling, sensory change, focal weakness, loss of consciousness and headaches.  Endo/Heme/Allergies: Does not bruise/bleed easily.  Psychiatric/Behavioral: Negative for depression, memory loss and substance abuse. The patient is not nervous/anxious.    Blood pressure (!) 189/96, pulse 60, temperature 98.2 F (36.8 C), temperature source Oral, resp. rate (!) 23, height 5\' 5"  (1.651 m), weight 70.3 kg, SpO2 96 %. Physical Exam  Constitutional: She appears  well-developed and well-nourished. No distress.  HENT:  Head: Normocephalic and atraumatic.  Eyes: Conjunctivae are normal. Right eye exhibits no discharge. Left eye exhibits no discharge. No scleral icterus.  Neck: Normal range of motion.  Cardiovascular: Normal rate and regular rhythm.  Respiratory: Effort normal. No respiratory distress.  Musculoskeletal:     Comments: Left shoulder, elbow, wrist, digits- no skin wounds, wrist mod TTP, mild deformity, no instability, no blocks to motion  Sens  Ax/R/M/U intact  Mot   Ax/ R/ PIN/ M/ AIN/ U intact  Rad 2+  Neurological: She is alert.  Skin: Skin is warm and dry. She is not diaphoretic.  Psychiatric: She has a normal mood and affect. Her behavior is normal.    Assessment/Plan: Left wrist fx -- Will plan elective ORIF by Dr. Grandville Silos in 1 week. Sugar tong splint and NWB until then.    Lisette Abu, PA-C Orthopedic Surgery [phone] 10/11/2019, 10:25 AM    S/p mechanical fall yesterday, with pain, swelling, deformity about left wrist. NVI LUE, no TTP about elbow xrays-comminuted left distal radius fx  with dorsal tilt and shortening Recs:  D/w patient these findings, and my preference for ORIF around 7-10 days from injury as optimal time. ST splint in ED, with f/u in the office later this week, likely Thursday, and planned ORIF on Monday at [facility].  Micheline Rough, MD Hand Surgery Mobile [phone]

## 2019-10-12 ENCOUNTER — Other Ambulatory Visit: Payer: Self-pay

## 2019-10-12 ENCOUNTER — Encounter (HOSPITAL_BASED_OUTPATIENT_CLINIC_OR_DEPARTMENT_OTHER): Payer: Self-pay | Admitting: *Deleted

## 2019-10-14 ENCOUNTER — Other Ambulatory Visit (HOSPITAL_COMMUNITY)
Admission: RE | Admit: 2019-10-14 | Discharge: 2019-10-14 | Disposition: A | Discharge status: Home / Self Care | Payer: Medicare Other | Source: Ambulatory Visit | Attending: Orthopedic Surgery | Admitting: Orthopedic Surgery

## 2019-10-14 DIAGNOSIS — Z01812 Encounter for preprocedural laboratory examination: Secondary | ICD-10-CM | POA: Diagnosis present

## 2019-10-14 DIAGNOSIS — Z20828 Contact with and (suspected) exposure to other viral communicable diseases: Secondary | ICD-10-CM | POA: Diagnosis not present

## 2019-10-15 LAB — NOVEL CORONAVIRUS, NAA (HOSP ORDER, SEND-OUT TO REF LAB; TAT 18-24 HRS): SARS-CoV-2, NAA: NOT DETECTED

## 2019-10-17 NOTE — Anesthesia Preprocedure Evaluation (Addendum)
Anesthesia Evaluation  Patient identified by MRN, date of birth, ID band  Reviewed: Allergy & Precautions, NPO status , Patient's Chart, lab work & pertinent test results  History of Anesthesia Complications (+) DIFFICULT AIRWAY  Airway Mallampati: II  TM Distance: >3 FB Neck ROM: Full    Dental no notable dental hx. (+) Upper Dentures, Lower Dentures   Pulmonary COPD, Current Smoker and Patient abstained from smoking.,    Pulmonary exam normal breath sounds clear to auscultation       Cardiovascular hypertension, Normal cardiovascular exam Rhythm:Regular Rate:Normal  11/2 Ekg SB R 59   Neuro/Psych PSYCHIATRIC DISORDERS Depression CVA    GI/Hepatic Neg liver ROS,   Endo/Other  Hypothyroidism   Renal/GU K+ 3.5 Cr 0.60     Musculoskeletal   Abdominal   Peds  Hematology  (+) anemia , Hgb 13.1   Anesthesia Other Findings   Reproductive/Obstetrics                          Anesthesia Physical Anesthesia Plan  ASA: III  Anesthesia Plan: MAC and Regional   Post-op Pain Management:    Induction:   PONV Risk Score and Plan: 1 and Treatment may vary due to age or medical condition  Airway Management Planned: Nasal Cannula and Natural Airway  Additional Equipment: None  Intra-op Plan:   Post-operative Plan:   Informed Consent: I have reviewed the patients History and Physical, chart, labs and discussed the procedure including the risks, benefits and alternatives for the proposed anesthesia with the patient or authorized representative who has indicated his/her understanding and acceptance.     Dental advisory given  Plan Discussed with: CRNA  Anesthesia Plan Comments: (Mac w LSupraClav block)       Anesthesia Quick Evaluation

## 2019-10-18 ENCOUNTER — Ambulatory Visit (HOSPITAL_BASED_OUTPATIENT_CLINIC_OR_DEPARTMENT_OTHER): Payer: Medicare Other | Admitting: Anesthesiology

## 2019-10-18 ENCOUNTER — Encounter (HOSPITAL_BASED_OUTPATIENT_CLINIC_OR_DEPARTMENT_OTHER)
Admission: RE | Disposition: A | Discharge status: Home / Self Care | Payer: Self-pay | Source: Home / Self Care | Attending: Orthopedic Surgery

## 2019-10-18 ENCOUNTER — Ambulatory Visit (HOSPITAL_COMMUNITY): Payer: Medicare Other

## 2019-10-18 ENCOUNTER — Other Ambulatory Visit: Payer: Self-pay

## 2019-10-18 ENCOUNTER — Encounter (HOSPITAL_BASED_OUTPATIENT_CLINIC_OR_DEPARTMENT_OTHER): Payer: Self-pay

## 2019-10-18 ENCOUNTER — Ambulatory Visit (HOSPITAL_BASED_OUTPATIENT_CLINIC_OR_DEPARTMENT_OTHER)
Admission: RE | Admit: 2019-10-18 | Discharge: 2019-10-18 | Disposition: A | Discharge status: Home / Self Care | Payer: Medicare Other | Attending: Orthopedic Surgery | Admitting: Orthopedic Surgery

## 2019-10-18 DIAGNOSIS — K219 Gastro-esophageal reflux disease without esophagitis: Secondary | ICD-10-CM | POA: Diagnosis not present

## 2019-10-18 DIAGNOSIS — F329 Major depressive disorder, single episode, unspecified: Secondary | ICD-10-CM | POA: Diagnosis not present

## 2019-10-18 DIAGNOSIS — W1839XA Other fall on same level, initial encounter: Secondary | ICD-10-CM | POA: Diagnosis not present

## 2019-10-18 DIAGNOSIS — Z8673 Personal history of transient ischemic attack (TIA), and cerebral infarction without residual deficits: Secondary | ICD-10-CM | POA: Diagnosis not present

## 2019-10-18 DIAGNOSIS — S52552A Other extraarticular fracture of lower end of left radius, initial encounter for closed fracture: Secondary | ICD-10-CM | POA: Insufficient documentation

## 2019-10-18 DIAGNOSIS — Z885 Allergy status to narcotic agent status: Secondary | ICD-10-CM | POA: Insufficient documentation

## 2019-10-18 DIAGNOSIS — Z881 Allergy status to other antibiotic agents status: Secondary | ICD-10-CM | POA: Insufficient documentation

## 2019-10-18 DIAGNOSIS — E785 Hyperlipidemia, unspecified: Secondary | ICD-10-CM | POA: Diagnosis not present

## 2019-10-18 DIAGNOSIS — I10 Essential (primary) hypertension: Secondary | ICD-10-CM | POA: Diagnosis not present

## 2019-10-18 DIAGNOSIS — Z96641 Presence of right artificial hip joint: Secondary | ICD-10-CM | POA: Diagnosis not present

## 2019-10-18 DIAGNOSIS — Z888 Allergy status to other drugs, medicaments and biological substances status: Secondary | ICD-10-CM | POA: Insufficient documentation

## 2019-10-18 DIAGNOSIS — Y93E3 Activity, vacuuming: Secondary | ICD-10-CM | POA: Diagnosis not present

## 2019-10-18 DIAGNOSIS — F17210 Nicotine dependence, cigarettes, uncomplicated: Secondary | ICD-10-CM | POA: Insufficient documentation

## 2019-10-18 DIAGNOSIS — F419 Anxiety disorder, unspecified: Secondary | ICD-10-CM | POA: Diagnosis not present

## 2019-10-18 DIAGNOSIS — Z419 Encounter for procedure for purposes other than remedying health state, unspecified: Secondary | ICD-10-CM

## 2019-10-18 DIAGNOSIS — E039 Hypothyroidism, unspecified: Secondary | ICD-10-CM | POA: Diagnosis not present

## 2019-10-18 DIAGNOSIS — M1990 Unspecified osteoarthritis, unspecified site: Secondary | ICD-10-CM | POA: Diagnosis not present

## 2019-10-18 DIAGNOSIS — J449 Chronic obstructive pulmonary disease, unspecified: Secondary | ICD-10-CM | POA: Diagnosis not present

## 2019-10-18 HISTORY — DX: Unspecified fracture of the lower end of left radius, initial encounter for closed fracture: S52.502A

## 2019-10-18 HISTORY — PX: OPEN REDUCTION INTERNAL FIXATION (ORIF) DISTAL RADIAL FRACTURE: SHX5989

## 2019-10-18 HISTORY — DX: Nicotine dependence, unspecified, uncomplicated: F17.200

## 2019-10-18 SURGERY — OPEN REDUCTION INTERNAL FIXATION (ORIF) DISTAL RADIUS FRACTURE
Anesthesia: Monitor Anesthesia Care | Site: Wrist | Laterality: Left

## 2019-10-18 MED ORDER — MIDAZOLAM HCL 2 MG/2ML IJ SOLN
INTRAMUSCULAR | Status: AC
  Filled 2019-10-18: qty 2

## 2019-10-18 MED ORDER — FENTANYL CITRATE (PF) 100 MCG/2ML IJ SOLN
INTRAMUSCULAR | Status: AC
  Filled 2019-10-18: qty 2

## 2019-10-18 MED ORDER — OXYCODONE HCL 5 MG PO TABS: 5.0000 mg | ORAL_TABLET | Freq: Four times a day (QID) | ORAL | 0 refills | Status: DC | PRN

## 2019-10-18 MED ORDER — MIDAZOLAM HCL 2 MG/2ML IJ SOLN
1.0000 mg | INTRAMUSCULAR | Status: DC | PRN
  Administered 2019-10-18: 2 mg via INTRAVENOUS

## 2019-10-18 MED ORDER — LIDOCAINE 2% (20 MG/ML) 5 ML SYRINGE
INTRAMUSCULAR | Status: AC
  Filled 2019-10-18: qty 5

## 2019-10-18 MED ORDER — CHLORHEXIDINE GLUCONATE 4 % EX LIQD: 60.0000 mL | Freq: Once | CUTANEOUS | Status: DC

## 2019-10-18 MED ORDER — ONDANSETRON HCL 4 MG/2ML IJ SOLN
INTRAMUSCULAR | Status: AC
  Filled 2019-10-18: qty 2

## 2019-10-18 MED ORDER — DEXAMETHASONE SODIUM PHOSPHATE 10 MG/ML IJ SOLN
INTRAMUSCULAR | Status: DC | PRN
  Administered 2019-10-18: 10 mg via INTRAVENOUS

## 2019-10-18 MED ORDER — ONDANSETRON HCL 4 MG/2ML IJ SOLN
INTRAMUSCULAR | Status: DC | PRN
  Administered 2019-10-18: 4 mg via INTRAVENOUS

## 2019-10-18 MED ORDER — CEFAZOLIN SODIUM-DEXTROSE 2-4 GM/100ML-% IV SOLN
2.0000 g | INTRAVENOUS | Status: AC
  Administered 2019-10-18: 2 g via INTRAVENOUS

## 2019-10-18 MED ORDER — CLONIDINE HCL (ANALGESIA) 100 MCG/ML EP SOLN
EPIDURAL | Status: DC | PRN
  Administered 2019-10-18: 100 ug

## 2019-10-18 MED ORDER — ACETAMINOPHEN 10 MG/ML IV SOLN: 1000.0000 mg | Freq: Once | INTRAVENOUS | Status: DC | PRN

## 2019-10-18 MED ORDER — FENTANYL CITRATE (PF) 100 MCG/2ML IJ SOLN: 25.0000 ug | INTRAMUSCULAR | Status: DC | PRN

## 2019-10-18 MED ORDER — IBUPROFEN 200 MG PO TABS: 600.0000 mg | ORAL_TABLET | Freq: Four times a day (QID) | ORAL | 0 refills | Status: DC

## 2019-10-18 MED ORDER — EPHEDRINE 5 MG/ML INJ
INTRAVENOUS | Status: AC
  Filled 2019-10-18: qty 10

## 2019-10-18 MED ORDER — GLYCOPYRROLATE 0.2 MG/ML IJ SOLN
INTRAMUSCULAR | Status: DC | PRN
  Administered 2019-10-18: 0.2 mg via INTRAVENOUS

## 2019-10-18 MED ORDER — LIDOCAINE HCL (CARDIAC) PF 100 MG/5ML IV SOSY
PREFILLED_SYRINGE | INTRAVENOUS | Status: DC | PRN
  Administered 2019-10-18: 50 mg via INTRATRACHEAL

## 2019-10-18 MED ORDER — LACTATED RINGERS IV SOLN
INTRAVENOUS | Status: DC
  Administered 2019-10-18 (×2): via INTRAVENOUS

## 2019-10-18 MED ORDER — 0.9 % SODIUM CHLORIDE (POUR BTL) OPTIME
TOPICAL | Status: DC | PRN
  Administered 2019-10-18: 1000 mL

## 2019-10-18 MED ORDER — CEFAZOLIN SODIUM-DEXTROSE 2-4 GM/100ML-% IV SOLN
INTRAVENOUS | Status: AC
  Filled 2019-10-18: qty 100

## 2019-10-18 MED ORDER — PROPOFOL 500 MG/50ML IV EMUL
INTRAVENOUS | Status: DC | PRN
  Administered 2019-10-18: 25 ug/kg/min via INTRAVENOUS

## 2019-10-18 MED ORDER — ONDANSETRON HCL 4 MG/2ML IJ SOLN: 4.0000 mg | Freq: Once | INTRAMUSCULAR | Status: DC | PRN

## 2019-10-18 MED ORDER — DEXAMETHASONE SODIUM PHOSPHATE 10 MG/ML IJ SOLN
INTRAMUSCULAR | Status: AC
  Filled 2019-10-18: qty 2

## 2019-10-18 MED ORDER — PROPOFOL 10 MG/ML IV BOLUS
INTRAVENOUS | Status: DC | PRN
  Administered 2019-10-18: 10 mg via INTRAVENOUS
  Administered 2019-10-18: 20 mg via INTRAVENOUS

## 2019-10-18 MED ORDER — ACETAMINOPHEN 325 MG PO TABS: 650.0000 mg | ORAL_TABLET | Freq: Four times a day (QID) | ORAL | Status: DC

## 2019-10-18 MED ORDER — PHENYLEPHRINE HCL (PRESSORS) 10 MG/ML IV SOLN
INTRAVENOUS | Status: DC | PRN
  Administered 2019-10-18 (×2): 120 ug via INTRAVENOUS
  Administered 2019-10-18 (×2): 80 ug via INTRAVENOUS

## 2019-10-18 MED ORDER — FENTANYL CITRATE (PF) 100 MCG/2ML IJ SOLN
INTRAMUSCULAR | Status: DC | PRN
  Administered 2019-10-18 (×4): 25 ug via INTRAVENOUS

## 2019-10-18 MED ORDER — ROPIVACAINE HCL 5 MG/ML IJ SOLN
INTRAMUSCULAR | Status: DC | PRN
  Administered 2019-10-18: 150 mg via PERINEURAL

## 2019-10-18 MED ORDER — EPHEDRINE SULFATE 50 MG/ML IJ SOLN
INTRAMUSCULAR | Status: DC | PRN
  Administered 2019-10-18: 10 mg via INTRAVENOUS

## 2019-10-18 MED ORDER — FENTANYL CITRATE (PF) 100 MCG/2ML IJ SOLN: 50.0000 ug | INTRAMUSCULAR | Status: DC | PRN

## 2019-10-18 MED ORDER — GLYCOPYRROLATE PF 0.2 MG/ML IJ SOSY
PREFILLED_SYRINGE | INTRAMUSCULAR | Status: AC
  Filled 2019-10-18: qty 2

## 2019-10-18 MED ORDER — PROPOFOL 500 MG/50ML IV EMUL
INTRAVENOUS | Status: AC
  Filled 2019-10-18: qty 50

## 2019-10-18 SURGICAL SUPPLY — 71 items
BAND RUBBER #18 3X1/16 STRL (MISCELLANEOUS) IMPLANT
BIT DRILL SOLID 2.0X40MM (BIT) IMPLANT
BIT DRILL SOLID 2.5X40MM (BIT) IMPLANT
BLADE MINI RND TIP GREEN BEAV (BLADE) IMPLANT
BLADE SURG 15 STRL LF DISP TIS (BLADE) ×1 IMPLANT
BLADE SURG 15 STRL SS (BLADE) ×2
BNDG COHESIVE 2X5 TAN STRL LF (GAUZE/BANDAGES/DRESSINGS) IMPLANT
BNDG COHESIVE 4X5 TAN STRL (GAUZE/BANDAGES/DRESSINGS) ×3 IMPLANT
BNDG ESMARK 4X9 LF (GAUZE/BANDAGES/DRESSINGS) ×3 IMPLANT
BNDG GAUZE ELAST 4 BULKY (GAUZE/BANDAGES/DRESSINGS) ×3 IMPLANT
BRUSH SCRUB EZ PLAIN DRY (MISCELLANEOUS) IMPLANT
CANISTER SUCT 1200ML W/VALVE (MISCELLANEOUS) ×3 IMPLANT
CHLORAPREP W/TINT 26 (MISCELLANEOUS) ×3 IMPLANT
CORD BIPOLAR FORCEPS 12FT (ELECTRODE) ×3 IMPLANT
COVER BACK TABLE REUSABLE LG (DRAPES) ×3 IMPLANT
COVER MAYO STAND REUSABLE (DRAPES) ×3 IMPLANT
COVER WAND RF STERILE (DRAPES) IMPLANT
CUFF TOURN SGL QUICK 18X4 (TOURNIQUET CUFF) IMPLANT
CUFF TOURN SGL QUICK 24 (TOURNIQUET CUFF)
CUFF TRNQT CYL 24X4X16.5-23 (TOURNIQUET CUFF) IMPLANT
DRAPE C-ARM 42X72 X-RAY (DRAPES) ×3 IMPLANT
DRAPE EXTREMITY T 121X128X90 (DISPOSABLE) ×3 IMPLANT
DRAPE SURG 17X23 STRL (DRAPES) ×3 IMPLANT
DRILL SOLID 2.0X40MM (BIT)
DRILL SOLID 2.5X40MM (BIT)
DRSG ADAPTIC 3X8 NADH LF (GAUZE/BANDAGES/DRESSINGS) ×3 IMPLANT
DRSG EMULSION OIL 3X3 NADH (GAUZE/BANDAGES/DRESSINGS) ×3 IMPLANT
ELECT REM PT RETURN 9FT ADLT (ELECTROSURGICAL) ×3
ELECTRODE REM PT RTRN 9FT ADLT (ELECTROSURGICAL) ×1 IMPLANT
GAUZE SPONGE 4X4 12PLY STRL LF (GAUZE/BANDAGES/DRESSINGS) ×3 IMPLANT
GLOVE BIO SURGEON STRL SZ7.5 (GLOVE) ×3 IMPLANT
GLOVE BIOGEL PI IND STRL 7.0 (GLOVE) ×1 IMPLANT
GLOVE BIOGEL PI IND STRL 8 (GLOVE) ×1 IMPLANT
GLOVE BIOGEL PI INDICATOR 7.0 (GLOVE) ×2
GLOVE BIOGEL PI INDICATOR 8 (GLOVE) ×2
GLOVE ECLIPSE 6.5 STRL STRAW (GLOVE) ×3 IMPLANT
GOWN STRL REUS W/ TWL LRG LVL3 (GOWN DISPOSABLE) ×2 IMPLANT
GOWN STRL REUS W/TWL LRG LVL3 (GOWN DISPOSABLE) ×4
GOWN STRL REUS W/TWL XL LVL3 (GOWN DISPOSABLE) ×3 IMPLANT
NEEDLE HYPO 25X1 1.5 SAFETY (NEEDLE) IMPLANT
NS IRRIG 1000ML POUR BTL (IV SOLUTION) ×3 IMPLANT
PACK BASIN DAY SURGERY FS (CUSTOM PROCEDURE TRAY) ×3 IMPLANT
PAD CAST 4YDX4 CTTN HI CHSV (CAST SUPPLIES) ×1 IMPLANT
PADDING CAST ABS 4INX4YD NS (CAST SUPPLIES)
PADDING CAST ABS COTTON 4X4 ST (CAST SUPPLIES) IMPLANT
PADDING CAST COTTON 4X4 STRL (CAST SUPPLIES) ×2
PENCIL BUTTON HOLSTER BLD 10FT (ELECTRODE) ×3 IMPLANT
SKELETAL DYNAMICS DVR SET (Set) ×3 IMPLANT
SLEEVE SCD COMPRESS KNEE MED (MISCELLANEOUS) ×3 IMPLANT
SLING ARM FOAM STRAP LRG (SOFTGOODS) IMPLANT
SPLINT PLASTER CAST XFAST 3X15 (CAST SUPPLIES) IMPLANT
SPLINT PLASTER CAST XFAST 4X15 (CAST SUPPLIES) ×1 IMPLANT
SPLINT PLASTER XTRA FAST SET 4 (CAST SUPPLIES) ×2
SPLINT PLASTER XTRA FASTSET 3X (CAST SUPPLIES)
STOCKINETTE 6  STRL (DRAPES) ×2
STOCKINETTE 6 STRL (DRAPES) ×1 IMPLANT
SUCTION FRAZIER HANDLE 10FR (MISCELLANEOUS) ×2
SUCTION TUBE FRAZIER 10FR DISP (MISCELLANEOUS) ×1 IMPLANT
SUT VIC AB 2-0 PS2 27 (SUTURE) ×3 IMPLANT
SUT VICRYL 4-0 PS2 18IN ABS (SUTURE) ×3 IMPLANT
SUT VICRYL RAPIDE 4-0 (SUTURE) IMPLANT
SUT VICRYL RAPIDE 4/0 PS 2 (SUTURE) ×3 IMPLANT
SYR 10ML LL (SYRINGE) IMPLANT
SYR BULB 3OZ (MISCELLANEOUS) ×3 IMPLANT
TOWEL GREEN STERILE FF (TOWEL DISPOSABLE) ×3 IMPLANT
TUBE CONNECTING 20'X1/4 (TUBING) ×1
TUBE CONNECTING 20X1/4 (TUBING) ×2 IMPLANT
UNDERPAD 30X36 HEAVY ABSORB (UNDERPADS AND DIAPERS) ×3 IMPLANT
WIRE FIX 1.5 STANDARD TIP (WIRE)
WIRE FIX 1.5 STD TIP (WIRE) IMPLANT
driver square tip 2.0mm ×3 IMPLANT

## 2019-10-18 NOTE — Progress Notes (Signed)
Assisted Dr. Houser with left, ultrasound guided, supraclavicular block. Side rails up, monitors on throughout procedure. See vital signs in flow sheet. Tolerated Procedure well. °

## 2019-10-18 NOTE — Anesthesia Postprocedure Evaluation (Signed)
Anesthesia Post Note  Patient: Michaela Guerra  Procedure(s) Performed: OPEN REDUCTION INTERNAL FIXATION (ORIF) DISTAL RADIAL FRACTURE (Left Wrist)     Patient location during evaluation: PACU Anesthesia Type: Regional Level of consciousness: awake and alert Pain management: pain level controlled Vital Signs Assessment: post-procedure vital signs reviewed and stable Respiratory status: spontaneous breathing, nonlabored ventilation, respiratory function stable and patient connected to nasal cannula oxygen Cardiovascular status: stable and blood pressure returned to baseline Postop Assessment: no apparent nausea or vomiting Anesthetic complications: no    Last Vitals:  Vitals:   10/18/19 0940 10/18/19 1030  BP:  130/82  Pulse:  69  Resp:  20  Temp:  36.7 C  SpO2: 92% 91%    Last Pain:  Vitals:   10/18/19 1030  TempSrc: Oral  PainSc: 0-No pain                 Barnet Glasgow

## 2019-10-18 NOTE — Transfer of Care (Signed)
Immediate Anesthesia Transfer of Care Note  Patient: Michaela Guerra  Procedure(s) Performed: OPEN REDUCTION INTERNAL FIXATION (ORIF) DISTAL RADIAL FRACTURE (Left Wrist)  Patient Location: PACU  Anesthesia Type:MAC and Regional  Level of Consciousness: awake, alert  and oriented  Airway & Oxygen Therapy: Patient Spontanous Breathing and Patient connected to face mask oxygen  Post-op Assessment: Report given to RN, Post -op Vital signs reviewed and unstable, Anesthesiologist notified, Patient moving all extremities X 4 and Patient able to stick tongue midline  Post vital signs: Reviewed and stable  Last Vitals:  Vitals Value Taken Time  BP 159/94 10/18/19 0905  Temp    Pulse 71 10/18/19 0907  Resp 19 10/18/19 0907  SpO2 99 % 10/18/19 0907  Vitals shown include unvalidated device data.  Last Pain:  Vitals:   10/18/19 0629  TempSrc: Oral  PainSc: 0-No pain         Complications: No apparent anesthesia complications

## 2019-10-18 NOTE — Interval H&P Note (Signed)
History and Physical Interval Note:  10/18/2019 7:33 AM  Michaela Guerra  has presented today for surgery, with the diagnosis of LEFT DISTAL RADIUS FRACTURE S52.502A.  The various methods of treatment have been discussed with the patient and family. After consideration of risks, benefits and other options for treatment, the patient has consented to  Procedure(s) with comments: OPEN REDUCTION INTERNAL FIXATION (ORIF) DISTAL RADIAL FRACTURE (Left) - SURGERY REQUEST TIME: 75 MIN  REGIONAL BLOCK as a surgical intervention.  The patient's history has been reviewed, patient examined, no change in status, stable for surgery.  I have reviewed the patient's chart and labs.  Questions were answered to the patient's satisfaction.     [name]

## 2019-10-18 NOTE — Progress Notes (Signed)
Dr Valma Cava aware of patients oxygen sat 87-92% on room air.

## 2019-10-18 NOTE — Op Note (Signed)
10/18/2019  7:33 AM  PATIENT:  Michaela Guerra  73 y.o. female  PRE-OPERATIVE DIAGNOSIS:  Displaced left extra-articular distal radius fracture  POST-OPERATIVE DIAGNOSIS:  Same  PROCEDURE:  ORIF left extra-articular distal radius fx, 62130  SURGEON: Rayvon Char. Grandville Silos, MD  PHYSICIAN ASSISTANT: Morley Kos, OPA-C  ANESTHESIA:  regional and MAC  SPECIMENS:  None  DRAINS: None  EBL:  less than 50 mL  PREOPERATIVE INDICATIONS:  Michaela Guerra is a  73 y.o. female with a displaced left distal radius fx.  The risks benefits and alternatives were discussed with the patient preoperatively including but not limited to the risks of infection, bleeding, nerve injury, cardiopulmonary complications, the need for revision surgery, among others, and the patient verbalized understanding and consented to proceed.  OPERATIVE IMPLANTS: [employer] Geminus plate/screws/pegs  OPERATIVE PROCEDURE: After receiving prophylactic antibiotics and a regional block, the patient was escorted to the operative theatre and placed in a supine position.  A surgical "time-out" was performed during which the planned procedure, proposed operative site, and the correct patient identity were compared to the operative consent and agreement confirmed by the circulating nurse according to current facility policy. Following application of a tourniquet to the operative extremity, the exposed skin was pre-scrubbed with Hibiclens scrub brush and then was prepped with Chloraprep and draped in the usual sterile fashion. The limb was exsanguinated with an Esmarch bandage and the tourniquet inflated to approximately 111mHg higher than systolic BP.   A sinusoidal-shaped incision was marked and made over the FCR axis and the distal forearm. The skin was incised sharply with scalpel, subcutaneous tissues with blunt and spreading dissection. The FCR axis was exploited deeply. The pronator quadratus was reflected in an L-shaped  ulnarly.  The fracture was inspected and provisionally reduced.  This was confirmed fluoroscopically. The appropriately sized plate (7-hole) was selected and found to fit well. It was placed in its provisional alignment of the radius and this was confirmed fluoroscopically.  It was secured to the radius with a screw through the slotted hole.  Additional adjustments were made as necessary, and the distal holes were all drilled and filled.  Peg/screw length distally was selected on the shorter side of measurements to minimize the risk for dorsal cortical penetration. The remainder of the proximal holes were drilled and filled.   Final images were obtained and the DRUJ was examined for stability. It was found to be sufficiently stable. The wound was then copiously irrigated and the PQ was repaired with 2-0 Vicryl Rapide suture.. Tourniquet was released and additional hemostasis obtained and the skin was closed with 2-0 Vicryl deep dermal buried sutures followed by running 4-0 Vicryl Rapide horizontal mattress suture in the skin. A bulky dressing with a volar plaster component was applied and the patient was taken to the recovery room in stable condition.  DISPOSITION: The patient will be discharged home today with typical post-op instructions, returning in 10-15 days for reevaluation with new x-rays of the affected wrist out of the splint to include an inclined lateral and then transition to therapy to have a custom splint constructed and begin rehabilitation.

## 2019-10-18 NOTE — Discharge Instructions (Signed)
Post Anesthesia Home Care Instructions  Activity: Get plenty of rest for the remainder of the day. A responsible individual must stay with you for 24 hours following the procedure.  For the next 24 hours, DO NOT: -Drive a car -Paediatric nurse -Drink alcoholic beverages -Take any medication unless instructed by your physician -Make any legal decisions or sign important papers.  Meals: Start with liquid foods such as gelatin or soup. Progress to regular foods as tolerated. Avoid greasy, spicy, heavy foods. If nausea and/or vomiting occur, drink only clear liquids until the nausea and/or vomiting subsides. Call your physician if vomiting continues.  Special Instructions/Symptoms: Your throat may feel dry or sore from the anesthesia or the breathing tube placed in your throat during surgery. If this causes discomfort, gargle with warm salt water. The discomfort should disappear within 24 hours.  If you had a scopolamine patch placed behind your ear for the management of post- operative nausea and/or vomiting:  1. The medication in the patch is effective for 72 hours, after which it should be removed.  Wrap patch in a tissue and discard in the trash. Wash hands thoroughly with soap and water. 2. You may remove the patch earlier than 72 hours if you experience unpleasant side effects which may include dry mouth, dizziness or visual disturbances. 3. Avoid touching the patch. Wash your hands with soap and water after contact with the patch.       Regional Anesthesia Blocks  1. Numbness or the inability to move the "blocked" extremity may last from 3-48 hours after placement. The length of time depends on the medication injected and your individual response to the medication. If the numbness is not going away after 48 hours, call your surgeon.  2. The extremity that is blocked will need to be protected until the numbness is gone and the  Strength has returned. Because you cannot feel it, you  will need to take extra care to avoid injury. Because it may be weak, you may have difficulty moving it or using it. You may not know what position it is in without looking at it while the block is in effect.  3. For blocks in the legs and feet, returning to weight bearing and walking needs to be done carefully. You will need to wait until the numbness is entirely gone and the strength has returned. You should be able to move your leg and foot normally before you try and bear weight or walk. You will need someone to be with you when you first try to ensure you do not fall and possibly risk injury.  4. Bruising and tenderness at the needle site are common side effects and will resolve in a few days.  5. Persistent numbness or new problems with movement should be communicated to the surgeon or the [facility] [phone] [place] [phone]).   Discharge Instructions   You have a dressing with a plaster splint incorporated in it. Move your fingers as much as possible, making a full fist and fully opening the fist. Elevate your hand to reduce pain & swelling of the digits.  Ice over the operative site may be helpful to reduce pain & swelling.  DO NOT USE HEAT. Pain medicine has been prescribed for you.  Take Tylenol 650 mg and ibuprofen 600 mg together every 6 hours. Additionally, take Oxycodone 5 mg for severe post operative pain. Leave the dressing in place until you return to our office.  You  may shower, but keep the bandage clean & dry.  You may drive a car when you are off of prescription pain medications and can safely control your vehicle with both hands. Our office will call you to arrange follow-up   Please call [phone] during normal business hours or [phone] after hours for any problems. Including the following:  - excessive redness of the incisions - drainage for more than 4 days - fever of more than 101.5 F  *Please note that pain  medications will not be refilled after hours or on weekends.  Nothing greater than pencil and paper tasks with the left arm. Post Anesthesia Home Care Instructions  Activity: Get plenty of rest for the remainder of the day. A responsible individual must stay with you for 24 hours following the procedure.  For the next 24 hours, DO NOT: -Drive a car -Paediatric nurse -Drink alcoholic beverages -Take any medication unless instructed by your physician -Make any legal decisions or sign important papers.  Meals: Start with liquid foods such as gelatin or soup. Progress to regular foods as tolerated. Avoid greasy, spicy, heavy foods. If nausea and/or vomiting occur, drink only clear liquids until the nausea and/or vomiting subsides. Call your physician if vomiting continues.  Special Instructions/Symptoms: Your throat may feel dry or sore from the anesthesia or the breathing tube placed in your throat during surgery. If this causes discomfort, gargle with warm salt water. The discomfort should disappear within 24 hours.  If you had a scopolamine patch placed behind your ear for the management of post- operative nausea and/or vomiting:  1. The medication in the patch is effective for 72 hours, after which it should be removed.  Wrap patch in a tissue and discard in the trash. Wash hands thoroughly with soap and water. 2. You may remove the patch earlier than 72 hours if you experience unpleasant side effects which may include dry mouth, dizziness or visual disturbances. 3. Avoid touching the patch. Wash your hands with soap and water after contact with the patch.    Regional Anesthesia Blocks  1. Numbness or the inability to move the "blocked" extremity may last from 3-48 hours after placement. The length of time depends on the medication injected and your individual response to the medication. If the numbness is not going away after 48 hours, call your surgeon.  2. The extremity that is  blocked will need to be protected until the numbness is gone and the  Strength has returned. Because you cannot feel it, you will need to take extra care to avoid injury. Because it may be weak, you may have difficulty moving it or using it. You may not know what position it is in without looking at it while the block is in effect.  3. For blocks in the legs and feet, returning to weight bearing and walking needs to be done carefully. You will need to wait until the numbness is entirely gone and the strength has returned. You should be able to move your leg and foot normally before you try and bear weight or walk. You will need someone to be with you when you first try to ensure you do not fall and possibly risk injury.  4. Bruising and tenderness at the needle site are common side effects and will resolve in a few days.  5. Persistent numbness or new problems with movement should be communicated to the surgeon or the [place] [phone] [place] [phone]).

## 2019-10-18 NOTE — Anesthesia Procedure Notes (Signed)
Anesthesia Regional Block: Supraclavicular block   Pre-Anesthetic Checklist: ,, timeout performed, Correct Patient, Correct Site, Correct Laterality, Correct Procedure, Correct Position, site marked, Risks and benefits discussed,  Surgical consent,  Pre-op evaluation,  At surgeon's request and post-op pain management  Laterality: Left  Prep: chloraprep       Needles:  Injection technique: Single-shot  Needle Type: Echogenic Needle     Needle Length: 5cm  Needle Gauge: 21     Additional Needles:   Procedures:,,,, ultrasound used (permanent image in chart),,,,  Narrative:  Start time: 10/18/2019 12:14 AM End time: 10/18/2019 7:22 AM Injection made incrementally with aspirations every 5 mL.  Performed by: Personally  Anesthesiologist: Barnet Glasgow, MD

## 2019-10-19 ENCOUNTER — Other Ambulatory Visit: Payer: Self-pay | Admitting: Vascular Surgery

## 2019-10-19 ENCOUNTER — Encounter (HOSPITAL_BASED_OUTPATIENT_CLINIC_OR_DEPARTMENT_OTHER): Payer: Self-pay | Admitting: Orthopedic Surgery

## 2019-10-19 DIAGNOSIS — I712 Thoracic aortic aneurysm, without rupture, unspecified: Secondary | ICD-10-CM

## 2019-10-19 NOTE — Addendum Note (Signed)
Addendum  created 10/19/19 1052 by Tawni Millers, CRNA   Charge Capture section accepted

## 2019-11-09 ENCOUNTER — Ambulatory Visit
Admission: RE | Admit: 2019-11-09 | Discharge: 2019-11-09 | Disposition: A | Discharge status: Home / Self Care | Payer: Medicare Other | Source: Ambulatory Visit | Attending: Vascular Surgery | Admitting: Vascular Surgery

## 2019-11-09 DIAGNOSIS — I712 Thoracic aortic aneurysm, without rupture, unspecified: Secondary | ICD-10-CM

## 2019-11-09 MED ORDER — IOPAMIDOL (ISOVUE-370) INJECTION 76%
75.0000 mL | Freq: Once | INTRAVENOUS | Status: AC | PRN
  Administered 2019-11-09: 75 mL via INTRAVENOUS

## 2019-11-18 ENCOUNTER — Telehealth: Payer: Self-pay | Admitting: Cardiology

## 2019-11-18 ENCOUNTER — Other Ambulatory Visit: Payer: Self-pay | Admitting: *Deleted

## 2019-11-18 ENCOUNTER — Other Ambulatory Visit: Payer: Self-pay

## 2019-11-18 ENCOUNTER — Ambulatory Visit (INDEPENDENT_AMBULATORY_CARE_PROVIDER_SITE_OTHER): Payer: Medicare Other | Admitting: Vascular Surgery

## 2019-11-18 ENCOUNTER — Encounter: Payer: Self-pay | Admitting: *Deleted

## 2019-11-18 ENCOUNTER — Encounter: Payer: Self-pay | Admitting: Vascular Surgery

## 2019-11-18 VITALS — BP 174/109 | HR 79 | Temp 97.7°F | Resp 20 | Ht 65.0 in | Wt 149.0 lb

## 2019-11-18 DIAGNOSIS — I712 Thoracic aortic aneurysm, without rupture, unspecified: Secondary | ICD-10-CM

## 2019-11-18 NOTE — Telephone Encounter (Signed)
   Primary Cardiologist:No primary care provider on file.  Chart reviewed as part of pre-operative protocol coverage. Because of Michaela Guerra's past medical history and time since last visit, he/she will require a follow-up visit in order to better assess preoperative cardiovascular risk.  I spoken with her daughter and scheduled her for 11/25/19 with Almyra Deforest, PA at the [facility] office. She verbalized understanding of need for appointment.   This note will be routed to Dr. Oneida Alar her vascular surgeon via the Epic Fax function.   Loel Dubonnet, NP  11/18/2019, 3:14 PM

## 2019-11-18 NOTE — Progress Notes (Signed)
Patient name: Michaela Guerra MRN: [id number] DOB: 08-14-1946 Sex: female  REASON FOR CONSULT: Thoracic aneurysm  HPI: Michaela Guerra is a 73 y.o. female, recently seen as a hospital consult after a ground-level fall.  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  This was asymptomatic.  He has no family history of aneurysms.  She has no significant chest trauma in the past.  Other medical problems include breast cancer, COPD, hypertension, hyperlipidemia, ongoing tobacco abuse.  In conversation with the patient's daughter today apparently there is also some family history of hemophilia.  The patient apparently has had a hip replacement in the past though and did not have any bleeding problems.  The daughter has requested though that we evaluate this fully before considering any aneurysm repair.  She currently has no chest pain.  She has no back pain.  He has not had any prior abdominal surgery.  She is on Zetia and aspirin.  Past Medical History:  Diagnosis Date  . AAA (abdominal aortic aneurysm) ([place])    detected on CT scan 11-2-saw vascular surgeon in ED  . Anxiety   . Arthritis   . Bleeding diathesis ([place])    Hx of   . Breast cancer ([place])    Adenocarcinoma, radiation and surgery; at age 83  . Closed fracture of left distal radius   . COLONIC POLYPS, HYPERPLASTIC 07/12/2009   Qualifier: Diagnosis of  By: Amil Amen MD, Benjamine Mola    . COPD (chronic obstructive pulmonary disease) (H[facility])    heavy smoker 1ppd  . CVA (cerebral vascular accident) ([place]) 11/2013   no deficits  . DECREASED HEARING, LEFT EAR 05/03/2010   Qualifier: Diagnosis of  By: Asa Lente MD, Jannifer Rodney   . Depression   . Derangement of anterior horn of lateral meniscus 04/24/2009   Annotation: Underwent right knee arthroscopic surgery 09/20/09: For  chondromalacia and anterolateral meniscectomy Qualifier: Diagnosis of  By:  Amil Amen MD, Benjamine Mola    . GERD (gastroesophageal reflux disease)   . Hyperlipidemia   . Hyperlipidemia   . Hypertension   . Hypothyroidism   . Polio    Dx at age 50 yrs  . Smoker    1ppd  . Stroke [facility]) 11/2013   Past Surgical History:  Procedure Laterality Date  . BREAST SURGERY Left    lumpectomy  . OPEN REDUCTION INTERNAL FIXATION (ORIF) DISTAL RADIAL FRACTURE Left 10/18/2019   Procedure: OPEN REDUCTION INTERNAL FIXATION (ORIF) DISTAL RADIAL FRACTURE;  Surgeon: Milly Jakob, MD;  Location: [place];  Service: Orthopedics;  Laterality: Left;  SURGERY REQUEST TIME: 75 MIN  REGIONAL BLOCK  . TOTAL HIP ARTHROPLASTY Right 10/04/2014   Procedure: TOTAL HIP ARTHROPLASTY ANTERIOR APPROACH;  Surgeon: Mcarthur Rossetti, MD;  Location: [place];  Service: Orthopedics;  Laterality: Right;    Family History  Problem Relation Age of Onset  . Breast cancer Mother   . Stroke Mother   . Stroke Father   . Breast cancer Sister   . Hypertension Sister   . Hypertension Sister   . Dementia Sister     SOCIAL HISTORY: Social History   Socioeconomic History  . Marital status: Single    Spouse name: Not on file  . Number of children: 5  . Years of education: Not on file  . Highest education level: Not on file  Occupational History  . Occupation: Retired  Tobacco Use  . Smoking status: Current Every Day Smoker    Packs/day: 1.00    Years: 40.00    Pack years: 40.00  . Smokeless tobacco: Never Used  . Tobacco comment: Has cut back to 1 ppd she is aware she needs to quit   Substance and Sexual Activity  . Alcohol use: Yes    Alcohol/week: 0.0 standard drinks    Comment: once every two weeks  . Drug use: No  . Sexual activity: Not Currently  Other Topics Concern  . Not on file  Social History Narrative   Single, Lives alone.     5 daughters: 79 grandchildren, 1 great grandson   Retired   Menopause at 89 yrs   Last mammogram 08/2014   Social Determinants of  Health   Financial Resource Strain:   . Difficulty of Paying Living Expenses: Not on file  Food Insecurity:   . Worried About Charity fundraiser in the Last Year: Not on file  . Ran Out of Food in the Last Year: Not on file  Transportation Needs:   . Lack of Transportation (Medical): Not on file  . Lack of Transportation (Non-Medical): Not on file  Physical Activity:   . Days of Exercise per Week: Not on file  . Minutes of Exercise per Session: Not on file  Stress:   . Feeling of Stress : Not on file  Social Connections:   . Frequency of Communication with Friends and Family: Not on file  . Frequency of Social Gatherings with Friends and Family: Not on file  . Attends Religious Services: Not on file  . Active Member of Clubs or Organizations: Not on file  . Attends Archivist Meetings: Not on file  . Marital Status: Not on file  Intimate Partner Violence:   . Fear of Current or Ex-Partner: Not on file  . Emotionally Abused: Not on file  . Physically Abused: Not on file  . Sexually Abused: Not on file    Allergies  Allergen Reactions  . Statins Other (See Comments)    Myalgias and fatigue  . Codeine     REACTION: causes skin to feel like it's crawling  . Levofloxacin     REACTION: nausea    Current Outpatient Medications  Medication Sig Dispense Refill  . acetaminophen (TYLENOL) 325 MG tablet Take 2 tablets (650 mg total) by mouth every 6 (six) hours.    Marland Kitchen amLODipine (NORVASC) 10 MG tablet Take by mouth.    Marland Kitchen aspirin 81 MG tablet Take 81 mg by mouth daily.    Marland Kitchen escitalopram (LEXAPRO) 5 MG tablet Take 5 mg by mouth daily.    Marland Kitchen ezetimibe (ZETIA) 10 MG tablet Take 1 tablet by mouth daily.    . fluticasone (FLONASE) 50 MCG/ACT nasal spray Place 1 spray into the nose as needed.    . hydrOXYzine (ATARAX/VISTARIL) 10 MG tablet Take by mouth.    Marland Kitchen ibuprofen (ADVIL) 200 MG tablet Take 3 tablets (600 mg total) by mouth every 6 (six) hours. 30 tablet 0  . levothyroxine  (SYNTHROID, LEVOTHROID) 75 MCG tablet Take 75 mcg by mouth daily.  0  . carvedilol (COREG) 6.25 MG tablet Take 1 tablet by mouth 2 (two) times daily.     No current facility-administered medications for this visit.    ROS:   General:  No weight loss, Fever, chills  HEENT: No recent headaches, no nasal bleeding, no visual changes, no sore throat  Neurologic: No  dizziness, blackouts, seizures. No recent symptoms of stroke or mini- stroke. No recent episodes of slurred speech, or temporary blindness.  Cardiac: No recent episodes of chest pain/pressure, no shortness of breath at rest.  + shortness of breath with exertion.  Denies history of atrial fibrillation or irregular heartbeat  Vascular: No history of rest pain in feet.  No history of claudication.  No history of non-healing ulcer, No history of DVT   Pulmonary: No home oxygen, no productive cough, no hemoptysis,  No asthma or wheezing  Musculoskeletal:  [X]  Arthritis, [ ]  Low back pain,  [ ]  Joint pain  Hematologic:No history of hypercoagulable state.  No history of easy bleeding.  No history of anemia  Gastrointestinal: No hematochezia or melena,  No gastroesophageal reflux, no trouble swallowing  Urinary: [ ]  chronic Kidney disease, [ ]  on HD - [ ]  MWF or [ ]  TTHS, [ ]  Burning with urination, [ ]  Frequent urination, [ ]  Difficulty urinating;   Skin: No rashes  Psychological: No history of anxiety,  No history of depression   Physical Examination  Vitals:   11/18/19 0947  BP: (!) 174/109  Pulse: 79  Resp: 20  Temp: 97.7 F (36.5 C)  SpO2: 93%  Weight: 149 lb (67.6 kg)  Height: 5\' 5"  (1.651 m)    Body mass index is 24.79 kg/m.  General:  Alert and oriented, no acute distress HEENT: Normal Neck: NoJVD Pulmonary: Clear to auscultation bilaterally Cardiac: Regular Rate and Rhythm  Abdomen: Soft, non-tender, non-distended, no mass Skin: No rash Extremity Pulses:  2+ radial, brachial, femoral, dorsalis pedis,  posterior tibial pulses bilaterally Musculoskeletal: No deformity or edema  Neurologic: Upper and lower extremity motor 5/5 and symmetric  DATA:  Reviewed the patient's repeat CT scan which was performed last week approximately 3 weeks after her initial CT scan.  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  There is a new penetrating aortic ulcer just below this in the descending thoracic aorta about 1 cm in size.  I reviewed all of these images.  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  On review of the x-rays she does not have adequate diameter of her external iliac and common femoral arteries to accept the delivery system for thoracic stent graft repair.  There showed for she will need a retroperitoneal exposure of the common iliac artery and a conduit placed.  She will also need preoperative carotid subclavian bypass in order to have enough room to seal on the top end of the stent.  All of these findings and pathology were discussed with the patient and her daughter today.   PLAN: 1.  Patient will be scheduled with cardiology for a cardiac risk stratification appointment prior to her aneurysm repair  2.  We will obtain clotting factor studies to make sure that she does not have an underlying hemophilia since she does have a family history of this.  Most likely low likelihood of this as she has previously had a hip replacement with no significant bleeding problems.  However, daughter was reassured that we would check this first.  3.  Patient will be scheduled for a left carotid subclavian bypass on December 06, 2019 in preparation for placing a thoracic aneurysm stent graft.  This will need to be done to allow extra room to have a good seal of  the proximal stent.  I discussed with the patient and her daughter today risk benefits and  possible complications of the carotid subclavian bypass including but not limited to bleeding infection stroke cranial nerve injury.  They understand and agree to proceed.  We will obtain a carotid duplex exam prior to this.  This is to make sure that she has no significant carotid bifurcation pathology that would need to be addressed simultaneously.  4.  Patient will be scheduled for a thoracic stent graft repair of her thoracic aortic aneurysm on December 12, 2018.  This will require a retroperitoneal exposure of the right common iliac artery since her external iliac arteries are too small to allow the delivery system.  Risk benefits possible complications and procedure details related to this including not limited to bleeding infection paraplegia vessel injury renal dysfunction were all discussed with the patient and her daughter today.  They wish to proceed.  Ruta Hinds, MD [facility] [place] Office: [phone] Pager: [phone]

## 2019-11-18 NOTE — Telephone Encounter (Signed)
° °  [facility] Pre-operative Risk Assessment    Request for surgical clearance:  1. What type of surgery is being performed? Left carotid subclavian bypass  2. When is this surgery scheduled? 12/06/19  3. What type of clearance is required (medical clearance vs. Pharmacy clearance to hold med vs. Both)? medical  4. Are there any medications that need to be held prior to surgery and how long?   5. Practice name and name of physician performing surgery? Dr. Oneida Alar, [facility] Ve[facility] Specialist  6. What is your office phone number: [phone]   7.   What is your office fax number: [phone]  8.   Anesthesia type (None, local, MAC, general) ? Unknown  Patient is scheduled to see Michaela Guerra 12/02/19   Michaela Guerra 11/18/2019, 11:34 AM  _________________________________________________________________   (provider comments below)

## 2019-11-18 NOTE — H&P (View-Only) (Signed)
Patient name: Michaela Guerra MRN: [id number] DOB: [birth year] Sex: female  REASON FOR CONSULT: Thoracic aneurysm  HPI: Michaela Guerra is a 73 y.o. female, recently seen as a hospital consult after a ground-level fall.  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  This was asymptomatic.  He has no family history of aneurysms.  She has no significant chest trauma in the past.  Other medical problems include breast cancer, COPD, hypertension, hyperlipidemia, ongoing tobacco abuse.  In conversation with the patient's daughter today apparently there is also some family history of hemophilia.  The patient apparently has had a hip replacement in the past though and did not have any bleeding problems.  The daughter has requested though that we evaluate this fully before considering any aneurysm repair.  She currently has no chest pain.  She has no back pain.  He has not had any prior abdominal surgery.  She is on Zetia and aspirin.  Past Medical History:  Diagnosis Date  . AAA (abdominal aortic aneurysm) ([place])    detected on CT scan 11-2-saw vascular surgeon in ED  . Anxiety   . Arthritis   . Bleeding diathesis ([place])    Hx of   . Breast cancer ([place])    Adenocarcinoma, radiation and surgery; at age 19  . Closed fracture of left distal radius   . COLONIC POLYPS, HYPERPLASTIC 07/12/2009   Qualifier: Diagnosis of  By: Amil Amen MD, Benjamine Mola    . COPD (chronic obstructive pulmonary disease) (H[facility])    heavy smoker 1ppd  . CVA (cerebral vascular accident) ([place]) 11/2013   no deficits  . DECREASED HEARING, LEFT EAR 05/03/2010   Qualifier: Diagnosis of  By: Asa Lente MD, Jannifer Rodney   . Depression   . Derangement of anterior horn of lateral meniscus 04/24/2009   Annotation: Underwent right knee arthroscopic surgery 09/20/09: For  chondromalacia and anterolateral meniscectomy Qualifier: Diagnosis of  By:  Amil Amen MD, Benjamine Mola    . GERD (gastroesophageal reflux disease)   . Hyperlipidemia   . Hyperlipidemia   . Hypertension   . Hypothyroidism   . Polio    Dx at age 73 yrs  . Smoker    1ppd  . Stroke [facility]) 11/2013   Past Surgical History:  Procedure Laterality Date  . BREAST SURGERY Left    lumpectomy  . OPEN REDUCTION INTERNAL FIXATION (ORIF) DISTAL RADIAL FRACTURE Left 10/18/2019   Procedure: OPEN REDUCTION INTERNAL FIXATION (ORIF) DISTAL RADIAL FRACTURE;  Surgeon: Milly Jakob, MD;  Location: [place];  Service: Orthopedics;  Laterality: Left;  SURGERY REQUEST TIME: 75 MIN  REGIONAL BLOCK  . TOTAL HIP ARTHROPLASTY Right 10/04/2014   Procedure: TOTAL HIP ARTHROPLASTY ANTERIOR APPROACH;  Surgeon: Mcarthur Rossetti, MD;  Location: [place];  Service: Orthopedics;  Laterality: Right;    Family History  Problem Relation Age of Onset  . Breast cancer Mother   . Stroke Mother   . Stroke Father   . Breast cancer Sister   . Hypertension Sister   . Hypertension Sister   . Dementia Sister     SOCIAL HISTORY: Social History   Socioeconomic History  . Marital status: Single    Spouse name: Not on file  . Number of children: 5  . Years of education: Not on file  . Highest education level: Not on file  Occupational History  . Occupation: Retired  Tobacco Use  . Smoking status: Current Every Day Smoker    Packs/day: 1.00    Years: 40.00    Pack years: 40.00  . Smokeless tobacco: Never Used  . Tobacco comment: Has cut back to 1 ppd she is aware she needs to quit   Substance and Sexual Activity  . Alcohol use: Yes    Alcohol/week: 0.0 standard drinks    Comment: once every two weeks  . Drug use: No  . Sexual activity: Not Currently  Other Topics Concern  . Not on file  Social History Narrative   Single, Lives alone.     5 daughters: 63 grandchildren, 1 great grandson   Retired   Menopause at 68 yrs   Last mammogram 08/2014   Social Determinants of  Health   Financial Resource Strain:   . Difficulty of Paying Living Expenses: Not on file  Food Insecurity:   . Worried About Charity fundraiser in the Last Year: Not on file  . Ran Out of Food in the Last Year: Not on file  Transportation Needs:   . Lack of Transportation (Medical): Not on file  . Lack of Transportation (Non-Medical): Not on file  Physical Activity:   . Days of Exercise per Week: Not on file  . Minutes of Exercise per Session: Not on file  Stress:   . Feeling of Stress : Not on file  Social Connections:   . Frequency of Communication with Friends and Family: Not on file  . Frequency of Social Gatherings with Friends and Family: Not on file  . Attends Religious Services: Not on file  . Active Member of Clubs or Organizations: Not on file  . Attends Archivist Meetings: Not on file  . Marital Status: Not on file  Intimate Partner Violence:   . Fear of Current or Ex-Partner: Not on file  . Emotionally Abused: Not on file  . Physically Abused: Not on file  . Sexually Abused: Not on file    Allergies  Allergen Reactions  . Statins Other (See Comments)    Myalgias and fatigue  . Codeine     REACTION: causes skin to feel like it's crawling  . Levofloxacin     REACTION: nausea    Current Outpatient Medications  Medication Sig Dispense Refill  . acetaminophen (TYLENOL) 325 MG tablet Take 2 tablets (650 mg total) by mouth every 6 (six) hours.    Marland Kitchen amLODipine (NORVASC) 10 MG tablet Take by mouth.    Marland Kitchen aspirin 81 MG tablet Take 81 mg by mouth daily.    Marland Kitchen escitalopram (LEXAPRO) 5 MG tablet Take 5 mg by mouth daily.    Marland Kitchen ezetimibe (ZETIA) 10 MG tablet Take 1 tablet by mouth daily.    . fluticasone (FLONASE) 50 MCG/ACT nasal spray Place 1 spray into the nose as needed.    . hydrOXYzine (ATARAX/VISTARIL) 10 MG tablet Take by mouth.    Marland Kitchen ibuprofen (ADVIL) 200 MG tablet Take 3 tablets (600 mg total) by mouth every 6 (six) hours. 30 tablet 0  . levothyroxine  (SYNTHROID, LEVOTHROID) 75 MCG tablet Take 75 mcg by mouth daily.  0  . carvedilol (COREG) 6.25 MG tablet Take 1 tablet by mouth 2 (two) times daily.     No current facility-administered medications for this visit.    ROS:   General:  No weight loss, Fever, chills  HEENT: No recent headaches, no nasal bleeding, no visual changes, no sore throat  Neurologic: No  dizziness, blackouts, seizures. No recent symptoms of stroke or mini- stroke. No recent episodes of slurred speech, or temporary blindness.  Cardiac: No recent episodes of chest pain/pressure, no shortness of breath at rest.  + shortness of breath with exertion.  Denies history of atrial fibrillation or irregular heartbeat  Vascular: No history of rest pain in feet.  No history of claudication.  No history of non-healing ulcer, No history of DVT   Pulmonary: No home oxygen, no productive cough, no hemoptysis,  No asthma or wheezing  Musculoskeletal:  [X]  Arthritis, [ ]  Low back pain,  [ ]  Joint pain  Hematologic:No history of hypercoagulable state.  No history of easy bleeding.  No history of anemia  Gastrointestinal: No hematochezia or melena,  No gastroesophageal reflux, no trouble swallowing  Urinary: [ ]  chronic Kidney disease, [ ]  on HD - [ ]  MWF or [ ]  TTHS, [ ]  Burning with urination, [ ]  Frequent urination, [ ]  Difficulty urinating;   Skin: No rashes  Psychological: No history of anxiety,  No history of depression   Physical Examination  Vitals:   11/18/19 0947  BP: (!) 174/109  Pulse: 79  Resp: 20  Temp: 97.7 F (36.5 C)  SpO2: 93%  Weight: 149 lb (67.6 kg)  Height: 5\' 5"  (1.651 m)    Body mass index is 24.79 kg/m.  General:  Alert and oriented, no acute distress HEENT: Normal Neck: NoJVD Pulmonary: Clear to auscultation bilaterally Cardiac: Regular Rate and Rhythm  Abdomen: Soft, non-tender, non-distended, no mass Skin: No rash Extremity Pulses:  2+ radial, brachial, femoral, dorsalis pedis,  posterior tibial pulses bilaterally Musculoskeletal: No deformity or edema  Neurologic: Upper and lower extremity motor 5/5 and symmetric  DATA:  Reviewed the patient's repeat CT scan which was performed last week approximately 3 weeks after her initial CT scan.  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  There is a new penetrating aortic ulcer just below this in the descending thoracic aorta about 1 cm in size.  I reviewed all of these images.  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  On review of the x-rays she does not have adequate diameter of her external iliac and common femoral arteries to accept the delivery system for thoracic stent graft repair.  There showed for she will need a retroperitoneal exposure of the common iliac artery and a conduit placed.  She will also need preoperative carotid subclavian bypass in order to have enough room to seal on the top end of the stent.  All of these findings and pathology were discussed with the patient and her daughter today.   PLAN: 1.  Patient will be scheduled with cardiology for a cardiac risk stratification appointment prior to her aneurysm repair  2.  We will obtain clotting factor studies to make sure that she does not have an underlying hemophilia since she does have a family history of this.  Most likely low likelihood of this as she has previously had a hip replacement with no significant bleeding problems.  However, daughter was reassured that we would check this first.  3.  Patient will be scheduled for a left carotid subclavian bypass on December 06, 2019 in preparation for placing a thoracic aneurysm stent graft.  This will need to be done to allow extra room to have a good seal of  the proximal stent.  I discussed with the patient and her daughter today risk benefits and  possible complications of the carotid subclavian bypass including but not limited to bleeding infection stroke cranial nerve injury.  They understand and agree to proceed.  We will obtain a carotid duplex exam prior to this.  This is to make sure that she has no significant carotid bifurcation pathology that would need to be addressed simultaneously.  4.  Patient will be scheduled for a thoracic stent graft repair of her thoracic aortic aneurysm on December 12, 2018.  This will require a retroperitoneal exposure of the right common iliac artery since her external iliac arteries are too small to allow the delivery system.  Risk benefits possible complications and procedure details related to this including not limited to bleeding infection paraplegia vessel injury renal dysfunction were all discussed with the patient and her daughter today.  They wish to proceed.  Ruta Hinds, MD [facility] [place] Office: [phone] Pager: [phone]

## 2019-11-22 ENCOUNTER — Telehealth: Payer: Self-pay | Admitting: Hematology and Oncology

## 2019-11-22 NOTE — Telephone Encounter (Signed)
Pt's daughter cld to schedule a hem appt w/Dr. Lorenso Courier on 12.15 at [place] that her mom will need to arrive 15 minutes early.

## 2019-11-22 NOTE — Progress Notes (Signed)
[facility] [phone]   [phone]  INITIAL CONSULT NOTE  Patient Care Team: Bernerd Limbo, MD as PCP - General (Family Medicine)  Hematological/Oncological History  #Family History of Hemophilia A --patients grandson has a history of severe Hemophilia A, factor level 0% --patients daughter has a history of mild Hemophilia A, factor level 23% --personal history of bruising, recurrent hematomas after breast mass resection on 01/20/2006.   CHIEF COMPLAINTS/PURPOSE OF CONSULTATION:  Preoperative Evaluation for coagulopathy.   HISTORY OF PRESENTING ILLNESS:  Michaela Guerra 73 y.o. female with medical history significant for breast cancer s/p radiation/surgery, COPD, GERD, HTN, hypothyroidism, and CVA in 2014 who presents for evaluation of a family history of hemophilia prior to a descending thoracic aneurysm repair scheduled on 12/06/2019.   On review of prior records Michaela Guerra has an extensive surgical history.  She had surgical excision of a breast mass in February 2007 and reportedly had recurrent hematomas that required draining after that surgery.  More recently she has had a total hip arthroplasty of the right side in [id number] without complication.  Most recently she had orthopedic repair of a right radial fracture in November 2020 (last month).  Per our records she has never required blood transfusion.  Of note she had a von Willebrand panel ordered in May 2012 which showed a factor VIII activity of 52% a von Willebrand factor antigen of 91% and Antithrombin activity of 80.  On exam today Michaela Guerra notes that she feels well.  She reports that she had a large bruise on her left lower extremity following a bump to her knee.  She reports that she does spontaneously develop bruising in her upper extremities and lower extremities, but denies any bruising on her chest back or abdomen.  She also denies bleeding from any other sources including no nosebleeds,  dark stools, or other overt signs of bleeding.  She does report having heavy menstrual bleeding when she was younger, but does not follow having any bleeding as a small child.  She notes that she did have dental extractions which may have bled more than usual.  A full 10 point ROS is listed below.  MEDICAL HISTORY:  Past Medical History:  Diagnosis Date  . AAA (abdominal aortic aneurysm) ([facility])    detected on CT scan 11-2-saw vascular surgeon in ED  . Anxiety   . Arthritis   . Bleeding diathesis ([facility])    Hx of   . Breast cancer ([facility])    Adenocarcinoma, radiation and surgery; at age 52  . Closed fracture of left distal radius   . COLONIC POLYPS, HYPERPLASTIC 07/12/2009   Qualifier: Diagnosis of  By: Amil Amen MD, Benjamine Mola    . COPD (chronic obstructive pulmonary disease) ([facility])    heavy smoker 1ppd  . CVA (cerebral vascular accident) ([facility]) 11/2013   no deficits  . DECREASED HEARING, LEFT EAR 05/03/2010   Qualifier: Diagnosis of  By: Asa Lente MD, Jannifer Rodney   . Depression   . Derangement of anterior horn of lateral meniscus 04/24/2009   Annotation: Underwent right knee arthroscopic surgery 09/20/09: For  chondromalacia and anterolateral meniscectomy Qualifier: Diagnosis of  By: Amil Amen MD, Benjamine Mola    . GERD (gastroesophageal reflux disease)   . Hyperlipidemia   . Hyperlipidemia   . Hypertension   . Hypothyroidism   . Polio    Dx at age 63 yrs  . Smoker    1ppd  . Stroke [facility]) 11/2013    SURGICAL HISTORY: Past  Surgical History:  Procedure Laterality Date  . BREAST SURGERY Left    lumpectomy  . OPEN REDUCTION INTERNAL FIXATION (ORIF) DISTAL RADIAL FRACTURE Left 10/18/2019   Procedure: OPEN REDUCTION INTERNAL FIXATION (ORIF) DISTAL RADIAL FRACTURE;  Surgeon: Milly Jakob, MD;  Location: [facility];  Service: Orthopedics;  Laterality: Left;  SURGERY REQUEST TIME: 75 MIN  REGIONAL BLOCK  . TOTAL HIP ARTHROPLASTY Right 10/04/2014   Procedure: TOTAL HIP ARTHROPLASTY  ANTERIOR APPROACH;  Surgeon: Mcarthur Rossetti, MD;  Location: [facility];  Service: Orthopedics;  Laterality: Right;    SOCIAL HISTORY: Social History   Socioeconomic History  . Marital status: Single    Spouse name: Not on file  . Number of children: 5  . Years of education: Not on file  . Highest education level: Not on file  Occupational History  . Occupation: Retired  Tobacco Use  . Smoking status: Current Every Day Smoker    Packs/day: 1.00    Years: 40.00    Pack years: 40.00  . Smokeless tobacco: Never Used  . Tobacco comment: Has cut back to 1 ppd she is aware she needs to quit   Substance and Sexual Activity  . Alcohol use: Yes    Alcohol/week: 0.0 standard drinks    Comment: once every two weeks  . Drug use: No  . Sexual activity: Not Currently  Other Topics Concern  . Not on file  Social History Narrative   Single, Lives alone.     5 daughters: 35 grandchildren, 1 great grandson   Retired   Menopause at 10 yrs   Last mammogram 08/2014   Social Determinants of Health   Financial Resource Strain:   . Difficulty of Paying Living Expenses: Not on file  Food Insecurity:   . Worried About Charity fundraiser in the Last Year: Not on file  . Ran Out of Food in the Last Year: Not on file  Transportation Needs:   . Lack of Transportation (Medical): Not on file  . Lack of Transportation (Non-Medical): Not on file  Physical Activity:   . Days of Exercise per Week: Not on file  . Minutes of Exercise per Session: Not on file  Stress:   . Feeling of Stress : Not on file  Social Connections:   . Frequency of Communication with Friends and Family: Not on file  . Frequency of Social Gatherings with Friends and Family: Not on file  . Attends Religious Services: Not on file  . Active Member of Clubs or Organizations: Not on file  . Attends Archivist Meetings: Not on file  . Marital Status: Not on file  Intimate Partner Violence:   . Fear of Current or  Ex-Partner: Not on file  . Emotionally Abused: Not on file  . Physically Abused: Not on file  . Sexually Abused: Not on file    FAMILY HISTORY: Family History  Problem Relation Age of Onset  . Breast cancer Mother   . Stroke Mother   . Stroke Father   . Breast cancer Sister   . Hypertension Sister   . Hypertension Sister   . Dementia Sister   . Hemophilia Grandson   . Hemophilia Daughter     ALLERGIES:  is allergic to statins; codeine; and levofloxacin.  MEDICATIONS:  Current Outpatient Medications  Medication Sig Dispense Refill  . acetaminophen (TYLENOL) 325 MG tablet Take 2 tablets (650 mg total) by mouth every 6 (six) hours. (Patient taking differently: Take 650  mg by mouth every 6 (six) hours as needed for mild pain. )    . amLODipine (NORVASC) 10 MG tablet Take 10 mg by mouth daily.     Marland Kitchen aspirin 81 MG tablet Take 81 mg by mouth daily.    . carvedilol (COREG) 6.25 MG tablet Take 12.5 mg by mouth 2 (two) times daily.     Marland Kitchen escitalopram (LEXAPRO) 5 MG tablet Take 5 mg by mouth daily.    Marland Kitchen ezetimibe (ZETIA) 10 MG tablet Take 10 mg by mouth daily.     . fluticasone (FLONASE) 50 MCG/ACT nasal spray Place 1 spray into the nose daily as needed for allergies or rhinitis.     Marland Kitchen ibuprofen (ADVIL) 200 MG tablet Take 3 tablets (600 mg total) by mouth every 6 (six) hours. (Patient taking differently: Take 600 mg by mouth every 6 (six) hours as needed for mild pain or moderate pain. ) 30 tablet 0  . levothyroxine (SYNTHROID, LEVOTHROID) 75 MCG tablet Take 75 mcg by mouth daily.  0   No current facility-administered medications for this visit.    REVIEW OF SYSTEMS:   Constitutional: ( - ) fevers, ( - )  chills , ( - ) night sweats Eyes: ( - ) blurriness of vision, ( - ) double vision, ( - ) watery eyes Ears, nose, mouth, throat, and face: ( - ) mucositis, ( - ) sore throat Respiratory: ( - ) cough, ( - ) dyspnea, ( - ) wheezes Cardiovascular: ( - ) palpitation, ( - ) chest  discomfort, ( - ) lower extremity swelling Gastrointestinal:  ( - ) nausea, ( - ) heartburn, ( - ) change in bowel habits Skin: ( - ) abnormal skin rashes Lymphatics: ( - ) new lymphadenopathy, ( + ) easy bruising Neurological: ( - ) numbness, ( - ) tingling, ( - ) new weaknesses Behavioral/Psych: ( - ) mood change, ( - ) new changes  All other systems were reviewed with the patient and are negative.  PHYSICAL EXAMINATION: ECOG PERFORMANCE STATUS: 1 - Symptomatic but completely ambulatory  Vitals:   11/23/19 1255  BP: (!) 154/91  Pulse: 76  Resp: 18  Temp: 98 F (36.7 C)  SpO2: 91%   Filed Weights   11/23/19 1255  Weight: 148 lb 6.4 oz (67.3 kg)    GENERAL: well appearing elderly Caucasian female in NAD  SKIN: skin color, texture, turgor are normal, no rashes or significant lesions EYES: conjunctiva are pink and non-injected, sclera clear LUNGS: clear to auscultation and percussion with normal breathing effort HEART: regular rate & rhythm and no murmurs and no lower extremity edema ABDOMEN: soft, non-tender, non-distended, normal bowel sounds Musculoskeletal: no cyanosis of digits and no clubbing  PSYCH: alert & oriented x 3, fluent speech NEURO: no focal motor/sensory deficits  LABORATORY DATA:  I have reviewed the data as listed Recent Results (from the past 2160 hour(s))  Basic metabolic panel     Status: Abnormal   Collection Time: 10/11/19  8:35 AM  Result Value Ref Range   Sodium 136 135 - 145 mmol/L   Potassium 3.5 3.5 - 5.1 mmol/L   Chloride 102 98 - 111 mmol/L   CO2 23 22 - 32 mmol/L   Glucose, Bld 115 (H) 70 - 99 mg/dL   BUN 8 8 - 23 mg/dL   Creatinine, Ser 0.68 0.44 - 1.00 mg/dL   Calcium 8.5 (L) 8.9 - 10.3 mg/dL   GFR calc non Af Amer >60 >60  mL/min   GFR calc Af Amer >60 >60 mL/min   Anion gap 11 5 - 15    Comment: Performed at [facility] [address], [place], [place] [address]  CBC with Differential     Status: None   Collection Time:  10/11/19  8:35 AM  Result Value Ref Range   WBC 8.3 4.0 - 10.5 K/uL   RBC 4.25 3.87 - 5.11 MIL/uL   Hemoglobin 13.1 12.0 - 15.0 g/dL   HCT 39.9 36.0 - 46.0 %   MCV 93.9 80.0 - 100.0 fL   MCH 30.8 26.0 - 34.0 pg   MCHC 32.8 30.0 - 36.0 g/dL   RDW 14.0 11.5 - 15.5 %   Platelets 206 150 - 400 K/uL   nRBC 0.0 0.0 - 0.2 %   Neutrophils Relative % 72 %   Neutro Abs 6.1 1.7 - 7.7 K/uL   Lymphocytes Relative 18 %   Lymphs Abs 1.5 0.7 - 4.0 K/uL   Monocytes Relative 8 %   Monocytes Absolute 0.6 0.1 - 1.0 K/uL   Eosinophils Relative 1 %   Eosinophils Absolute 0.1 0.0 - 0.5 K/uL   Basophils Relative 1 %   Basophils Absolute 0.1 0.0 - 0.1 K/uL   Immature Granulocytes 0 %   Abs Immature Granulocytes 0.02 0.00 - 0.07 K/uL    Comment: Performed at [facility], [address] [place], [place] [address]  Troponin I (High Sensitivity)     Status: None   Collection Time: 10/11/19  8:35 AM  Result Value Ref Range   Troponin I (High Sensitivity) 3 <18 ng/L    Comment: (NOTE) Elevated high sensitivity troponin I (hsTnI) values and significant  changes across serial measurements may suggest ACS but many other  chronic and acute conditions are known to elevate hsTnI results.  Refer to the "Links" section for chest pain algorithms and additional  guidance. Performed at [facility], [place] [address], [place], [place] [address]   Troponin I (High Sensitivity)     Status: None   Collection Time: 10/11/19 10:28 AM  Result Value Ref Range   Troponin I (High Sensitivity) 4 <18 ng/L    Comment: (NOTE) Elevated high sensitivity troponin I (hsTnI) values and significant  changes across serial measurements may suggest ACS but many other  chronic and acute conditions are known to elevate hsTnI results.  Refer to the "Links" section for chest pain algorithms and additional  guidance. Performed at [facility], [place] [address] [place], [place] [address]   Novel Coronavirus, NAA (Hosp  order, Send-out to Ref Lab; TAT 18-24 hrs     Status: None   Collection Time: 10/14/19  1:52 PM   Specimen: Nasopharyngeal Swab; Respiratory  Result Value Ref Range   SARS-CoV-2, NAA NOT DETECTED NOT DETECTED    Comment: (NOTE) This nucleic acid amplification test was developed and its performance characteristics determined by [employer]. Nucleic acid amplification tests include PCR and TMA. This test has not been FDA cleared or approved. This test has been authorized by FDA under an Emergency Use Authorization (EUA). This test is only authorized for the duration of time the declaration that circumstances exist justifying the authorization of the emergency use of in vitro diagnostic tests for detection of SARS-CoV-2 virus and/or diagnosis of COVID-19 infection under section 564(b)(1) of the Act, 21 U.S.C. PT:2852782) (1), unless the authorization is terminated or revoked sooner. When diagnostic testing is negative, the possibility  of a false negative result should be considered in the context of a patient's recent exposures and the presence of clinical signs and symptoms consistent with COVID-19. An individual without symptoms of COVID- 19 and who is not shedding SARS-CoV-2 vi rus would expect to have a negative (not detected) result in this assay. Performed At: [facility] [place], [place] [id number] Rush Farmer MD [id number]    Coronavirus Source NASOPHARYNGEAL     Comment: Performed at [facility], [place] [address], [place], [place] [address]  CBC with Differential ([facility] Only)     Status: Abnormal   Collection Time: 11/23/19  2:08 PM  Result Value Ref Range   WBC Count 8.2 4.0 - 10.5 K/uL   RBC 5.11 3.87 - 5.11 MIL/uL   Hemoglobin 15.9 (H) 12.0 - 15.0 g/dL   HCT 47.5 (H) 36.0 - 46.0 %   MCV 93.0 80.0 - 100.0 fL   MCH 31.1 26.0 - 34.0 pg   MCHC 33.5 30.0 - 36.0 g/dL   RDW 15.1 11.5 - 15.5 %   Platelet Count 243 150 - 400  K/uL   nRBC 0.0 0.0 - 0.2 %   Neutrophils Relative % 62 %   Neutro Abs 5.1 1.7 - 7.7 K/uL   Lymphocytes Relative 28 %   Lymphs Abs 2.3 0.7 - 4.0 K/uL   Monocytes Relative 8 %   Monocytes Absolute 0.7 0.1 - 1.0 K/uL   Eosinophils Relative 1 %   Eosinophils Absolute 0.0 0.0 - 0.5 K/uL   Basophils Relative 1 %   Basophils Absolute 0.1 0.0 - 0.1 K/uL   Immature Granulocytes 0 %   Abs Immature Granulocytes 0.01 0.00 - 0.07 K/uL    Comment: Performed at [facility], [place] [address], [place],  [address]    PATHOLOGY: None relevant to review.   RADIOGRAPHIC STUDIES: None relevant to review   ASSESSMENT & PLAN Michaela Guerra 73 y.o. female with medical history significant for breast cancer s/p radiation/surgery, COPD, GERD, HTN, hypothyroidism, and CVA in 2014 who presents for evaluation of a family history of hemophilia prior to a descending thoracic aneurysm repair scheduled on 12/06/2019.   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  Her daughter has a level of 22% and her grandson has a level of 0% and is entirely factor dependent.  When checked previously in 2012 Michaela Guerra's levels were found to be 52%.  Per guideline recommendations of the world Federation of hemophilia in 2008 a level of 52% and a carrier would merit factor support in and around the time of operation.  In order to confirm that this is her primary hematological abnormality we will order a coagulation work-up today.  In the event her factor levels were less than 60% we would need to consider factor support, likely in the form of recombinant factor VIII.  Of note the patient's history is very reassuring with 2 recent orthopedic procedures with no hemorrhagic complications.  However at the time of her breast biopsy in 2014 she did have some recurrent hematoma.  Once we have the  below labs we will discuss with the patient the risks and benefits of factor support.  #Family History of Hemophilia in patient with upcoming Descending Thoracic Aorta Aneurysm --today will evaluated baseline coagulopathy with PT/INR/PTT, thrombin time and platelet aggregation assay --will collect a  Factor VIII assay, Factor VII assay, and Factor XIII assay. Prior von Willebrand antigen 91, within normal limits.  --additionally will collect a CBC, CMP, and vitamin C level   --it is reassuring that the patient has undergone numerous prior orthopedic surgeries with no evidence of hemorrhagic complications. --encourage smoking cessation to improve wound healing post operatively --will render recommendations regarding factor support and peri-operative management once the above coagulopathy workup has resulted. --RTC PRN. No indication for routine f/u.   Orders Placed This Encounter  Procedures  . CBC with Differential (Cancer Center Only)    Standing Status:   Future    Number of Occurrences:   1    Standing Expiration Date:   11/22/2020  . CMP ([facility] only)    Standing Status:   Future    Number of Occurrences:   1    Standing Expiration Date:   11/22/2020  . APTT    Standing Status:   Future    Number of Occurrences:   1    Standing Expiration Date:   11/22/2020  . Protime-INR    Standing Status:   Future    Number of Occurrences:   1    Standing Expiration Date:   11/22/2020  . Factor 8 assay    Standing Status:   Future    Number of Occurrences:   1    Standing Expiration Date:   11/22/2020  . Factor 7 assay    Standing Status:   Future    Number of Occurrences:   1    Standing Expiration Date:   11/22/2020  . Factor 13 activity    Standing Status:   Future    Number of Occurrences:   1    Standing Expiration Date:   11/22/2020  . Thrombin time    Standing Status:   Future    Number of Occurrences:   1    Standing Expiration Date:   11/22/2020  . Fibrinogen     Standing Status:   Future    Number of Occurrences:   1    Standing Expiration Date:   11/22/2020  . Platelet function assay    Standing Status:   Future    Number of Occurrences:   1    Standing Expiration Date:   11/22/2020  . Vitamin C    All questions were answered. The patient knows to call the clinic with any problems, questions or concerns.  A total of more than 60 minutes were spent on this encounter and over half of that time was spent on counseling and coordination of care as outlined above.   Ledell Peoples, MD Department of Hematology/Oncology [place] Phone: [phone] Pager: [phone] Email: [email or web address]  11/23/2019 2:49 PM

## 2019-11-23 ENCOUNTER — Telehealth (HOSPITAL_COMMUNITY): Payer: Self-pay | Admitting: *Deleted

## 2019-11-23 ENCOUNTER — Encounter: Payer: Self-pay | Admitting: Hematology and Oncology

## 2019-11-23 ENCOUNTER — Other Ambulatory Visit: Payer: Self-pay

## 2019-11-23 ENCOUNTER — Inpatient Hospital Stay: Payer: Medicare Other | Attending: Hematology and Oncology | Admitting: Hematology and Oncology

## 2019-11-23 ENCOUNTER — Inpatient Hospital Stay: Payer: Medicare Other

## 2019-11-23 VITALS — BP 154/91 | HR 76 | Temp 98.0°F | Resp 18 | Ht 65.0 in | Wt 148.4 lb

## 2019-11-23 DIAGNOSIS — Z853 Personal history of malignant neoplasm of breast: Secondary | ICD-10-CM | POA: Diagnosis not present

## 2019-11-23 DIAGNOSIS — Z79899 Other long term (current) drug therapy: Secondary | ICD-10-CM | POA: Insufficient documentation

## 2019-11-23 DIAGNOSIS — Z7982 Long term (current) use of aspirin: Secondary | ICD-10-CM

## 2019-11-23 DIAGNOSIS — I712 Thoracic aortic aneurysm, without rupture, unspecified: Secondary | ICD-10-CM

## 2019-11-23 DIAGNOSIS — Z832 Family history of diseases of the blood and blood-forming organs and certain disorders involving the immune mechanism: Secondary | ICD-10-CM | POA: Insufficient documentation

## 2019-11-23 DIAGNOSIS — Z96641 Presence of right artificial hip joint: Secondary | ICD-10-CM | POA: Diagnosis not present

## 2019-11-23 DIAGNOSIS — Z791 Long term (current) use of non-steroidal anti-inflammatories (NSAID): Secondary | ICD-10-CM | POA: Insufficient documentation

## 2019-11-23 DIAGNOSIS — F329 Major depressive disorder, single episode, unspecified: Secondary | ICD-10-CM | POA: Insufficient documentation

## 2019-11-23 DIAGNOSIS — Z8249 Family history of ischemic heart disease and other diseases of the circulatory system: Secondary | ICD-10-CM | POA: Insufficient documentation

## 2019-11-23 DIAGNOSIS — D66 Hereditary factor VIII deficiency: Secondary | ICD-10-CM | POA: Diagnosis not present

## 2019-11-23 DIAGNOSIS — J449 Chronic obstructive pulmonary disease, unspecified: Secondary | ICD-10-CM | POA: Diagnosis not present

## 2019-11-23 DIAGNOSIS — Z803 Family history of malignant neoplasm of breast: Secondary | ICD-10-CM | POA: Insufficient documentation

## 2019-11-23 DIAGNOSIS — Z8673 Personal history of transient ischemic attack (TIA), and cerebral infarction without residual deficits: Secondary | ICD-10-CM | POA: Diagnosis not present

## 2019-11-23 DIAGNOSIS — E785 Hyperlipidemia, unspecified: Secondary | ICD-10-CM | POA: Diagnosis not present

## 2019-11-23 DIAGNOSIS — E039 Hypothyroidism, unspecified: Secondary | ICD-10-CM

## 2019-11-23 DIAGNOSIS — F419 Anxiety disorder, unspecified: Secondary | ICD-10-CM | POA: Insufficient documentation

## 2019-11-23 DIAGNOSIS — Z923 Personal history of irradiation: Secondary | ICD-10-CM

## 2019-11-23 DIAGNOSIS — F17210 Nicotine dependence, cigarettes, uncomplicated: Secondary | ICD-10-CM | POA: Insufficient documentation

## 2019-11-23 DIAGNOSIS — I10 Essential (primary) hypertension: Secondary | ICD-10-CM | POA: Insufficient documentation

## 2019-11-23 LAB — PLATELET FUNCTION ASSAY
Collagen / ADP: 72 seconds (ref 0–118)
Collagen / Epinephrine: 236 seconds — ABNORMAL HIGH (ref 0–193)

## 2019-11-23 LAB — APTT: aPTT: 30 seconds (ref 24–36)

## 2019-11-23 LAB — PROTIME-INR
INR: 1 (ref 0.8–1.2)
Prothrombin Time: 12.5 seconds (ref 11.4–15.2)

## 2019-11-23 LAB — CBC WITH DIFFERENTIAL (CANCER CENTER ONLY)
Abs Immature Granulocytes: 0.01 10*3/uL (ref 0.00–0.07)
Basophils Absolute: 0.1 10*3/uL (ref 0.0–0.1)
Basophils Relative: 1 %
Eosinophils Absolute: 0 10*3/uL (ref 0.0–0.5)
Eosinophils Relative: 1 %
HCT: 47.5 % — ABNORMAL HIGH (ref 36.0–46.0)
Hemoglobin: 15.9 g/dL — ABNORMAL HIGH (ref 12.0–15.0)
Immature Granulocytes: 0 %
Lymphocytes Relative: 28 %
Lymphs Abs: 2.3 10*3/uL (ref 0.7–4.0)
MCH: 31.1 pg (ref 26.0–34.0)
MCHC: 33.5 g/dL (ref 30.0–36.0)
MCV: 93 fL (ref 80.0–100.0)
Monocytes Absolute: 0.7 10*3/uL (ref 0.1–1.0)
Monocytes Relative: 8 %
Neutro Abs: 5.1 10*3/uL (ref 1.7–7.7)
Neutrophils Relative %: 62 %
Platelet Count: 243 10*3/uL (ref 150–400)
RBC: 5.11 MIL/uL (ref 3.87–5.11)
RDW: 15.1 % (ref 11.5–15.5)
WBC Count: 8.2 10*3/uL (ref 4.0–10.5)
nRBC: 0 % (ref 0.0–0.2)

## 2019-11-23 LAB — CMP (CANCER CENTER ONLY)
ALT: 11 U/L (ref 0–44)
AST: 15 U/L (ref 15–41)
Albumin: 4 g/dL (ref 3.5–5.0)
Alkaline Phosphatase: 101 U/L (ref 38–126)
Anion gap: 11 (ref 5–15)
BUN: 6 mg/dL — ABNORMAL LOW (ref 8–23)
CO2: 27 mmol/L (ref 22–32)
Calcium: 9.3 mg/dL (ref 8.9–10.3)
Chloride: 98 mmol/L (ref 98–111)
Creatinine: 0.78 mg/dL (ref 0.44–1.00)
GFR, Est AFR Am: >60 mL/min (ref >60)
GFR, Estimated: >60 mL/min (ref >60)
Glucose, Bld: 105 mg/dL — ABNORMAL HIGH (ref 70–99)
Potassium: 3.6 mmol/L (ref 3.5–5.1)
Sodium: 136 mmol/L (ref 135–145)
Total Bilirubin: 1.2 mg/dL (ref 0.3–1.2)
Total Protein: 7.3 g/dL (ref 6.5–8.1)

## 2019-11-23 LAB — FIBRINOGEN: Fibrinogen: 432 mg/dL (ref 210–475)

## 2019-11-23 NOTE — Telephone Encounter (Signed)

## 2019-11-24 ENCOUNTER — Encounter (HOSPITAL_COMMUNITY): Payer: Medicare Other

## 2019-11-25 ENCOUNTER — Other Ambulatory Visit: Payer: Self-pay

## 2019-11-25 ENCOUNTER — Encounter: Payer: Self-pay | Admitting: Physician Assistant

## 2019-11-25 ENCOUNTER — Ambulatory Visit (INDEPENDENT_AMBULATORY_CARE_PROVIDER_SITE_OTHER): Payer: Medicare Other | Admitting: Physician Assistant

## 2019-11-25 VITALS — BP 140/80 | HR 62 | Ht 65.0 in | Wt 147.0 lb

## 2019-11-25 DIAGNOSIS — I7121 Aneurysm of the ascending aorta, without rupture: Secondary | ICD-10-CM

## 2019-11-25 DIAGNOSIS — I10 Essential (primary) hypertension: Secondary | ICD-10-CM | POA: Diagnosis not present

## 2019-11-25 DIAGNOSIS — I712 Thoracic aortic aneurysm, without rupture: Secondary | ICD-10-CM

## 2019-11-25 DIAGNOSIS — E039 Hypothyroidism, unspecified: Secondary | ICD-10-CM

## 2019-11-25 DIAGNOSIS — Z720 Tobacco use: Secondary | ICD-10-CM

## 2019-11-25 DIAGNOSIS — E785 Hyperlipidemia, unspecified: Secondary | ICD-10-CM

## 2019-11-25 LAB — FACTOR 7 ASSAY: Factor VII Activity: 97 % (ref 51–186)

## 2019-11-25 LAB — THROMBIN TIME: Thrombin Time: 17.7 s (ref 0.0–23.0)

## 2019-11-25 LAB — FACTOR 8 ASSAY: Coagulation Factor VIII: 84 % (ref 56–140)

## 2019-11-25 NOTE — Progress Notes (Signed)
Cardiology Office Note:    Date:  11/27/2019   ID:  Michaela Guerra, DOB March 15, 1946, MRN [id number]  PCP:  Bernerd Limbo, MD  Cardiologist:  Minus Breeding, MD  Electrophysiologist:  None   Referring MD: Bernerd Limbo, MD   Chief Complaint  Patient presents with  . Pre-op Exam    upcoming vascular surgery by Dr. Oneida Alar    History of Present Illness:    Michaela Guerra is a 73 y.o. female with a hx of AAA, history of CVA, COPD, hypertension, hyperlipidemia, hypothyroidism, and history of tobacco abuse.  He had a very minimal plaque on cardiac catheterization in 2007.  He was last seen by Dr. Percival Spanish on 04/17/2017 at which time she had burning sensation in the chest.  Myoview was recommended and was done on 04/24/2017 which showed EF 61%, no evidence of prior infarct or ischemia.    Patient recently had a fall.  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  This was asymptomatic.  The patient was seen by Dr. Oneida Alar as outpatient for recommended surgical repair.  Since that she has a family history of hemophilia, clotting factor study was also recommended prior to the surgery.  She is currently scheduled for a left carotid subclavian bypass surgery near the end of December and has thoracic stent graft repair of the thoracic aortic aneurysm in January.  Patient denies any recent chest pain or shortness of breath.  She does have a smoker's cough.  She has been smoking for the past 50 years.  I strongly urged her not to smoke anymore.  Her blood pressure reasonable, however she could be better.  I am hesitant to titrate her blood pressure medication prior to the surgery and recommend she follows up with [place] in 2 to 3 months so we can adjust her blood pressure medication at that time.  With history of thoracic aortic aneurysm, her blood pressure goal should be around 120-130s.  Overall she has been doing well  and denies any obvious anginal symptom.  I discussed her case with DOD Dr. Peter Martinique, we do not recommend any further work-up.  She is cleared to proceed with the surgery as a low risk patient.     Past Medical History:  Diagnosis Date  . AAA (abdominal aortic aneurysm) ([place])    detected on CT scan 11-2-saw vascular surgeon in ED  . Anxiety   . Arthritis   . Bleeding diathesis ([place])    Hx of   . Breast cancer ([place])    Adenocarcinoma, radiation and surgery; at age 51  . Closed fracture of left distal radius   . COLONIC POLYPS, HYPERPLASTIC 07/12/2009   Qualifier: Diagnosis of  By: Amil Amen MD, Benjamine Mola    . COPD (chronic obstructive pulmonary disease) ([facility])    heavy smoker 1ppd  . CVA (cerebral vascular accident) ([place]) 11/2013   no deficits  . DECREASED HEARING, LEFT EAR 05/03/2010   Qualifier: Diagnosis of  By: Asa Lente MD, Jannifer Rodney   . Depression   . Derangement of anterior horn of lateral meniscus 04/24/2009   Annotation: Underwent right knee arthroscopic surgery 09/20/09: For  chondromalacia and anterolateral meniscectomy Qualifier: Diagnosis of  By: Amil Amen MD, Benjamine Mola    . GERD (gastroesophageal reflux disease)   . Hyperlipidemia   . Hyperlipidemia   . Hypertension   . Hypothyroidism   . Polio    Dx  at age 2 yrs  . Smoker    1ppd  . Stroke [facility]) 11/2013    Past Surgical History:  Procedure Laterality Date  . BREAST SURGERY Left    lumpectomy  . OPEN REDUCTION INTERNAL FIXATION (ORIF) DISTAL RADIAL FRACTURE Left 10/18/2019   Procedure: OPEN REDUCTION INTERNAL FIXATION (ORIF) DISTAL RADIAL FRACTURE;  Surgeon: Milly Jakob, MD;  Location: [facility];  Service: Orthopedics;  Laterality: Left;  SURGERY REQUEST TIME: 75 MIN  REGIONAL BLOCK  . TOTAL HIP ARTHROPLASTY Right 10/04/2014   Procedure: TOTAL HIP ARTHROPLASTY ANTERIOR APPROACH;  Surgeon: Mcarthur Rossetti, MD;  Location: [place] [facility];  Service: Orthopedics;  Laterality: Right;    Current  Medications: Current Meds  Medication Sig  . acetaminophen (TYLENOL) 325 MG tablet Take 2 tablets (650 mg total) by mouth every 6 (six) hours. (Patient taking differently: Take 650 mg by mouth every 6 (six) hours as needed for mild pain. )  . amLODipine (NORVASC) 10 MG tablet Take 10 mg by mouth daily.   Marland Kitchen aspirin 81 MG tablet Take 81 mg by mouth daily.  . carvedilol (COREG) 6.25 MG tablet Take 12.5 mg by mouth 2 (two) times daily.   Marland Kitchen escitalopram (LEXAPRO) 5 MG tablet Take 5 mg by mouth daily.  Marland Kitchen ezetimibe (ZETIA) 10 MG tablet Take 10 mg by mouth daily.   . fluticasone (FLONASE) 50 MCG/ACT nasal spray Place 1 spray into the nose daily as needed for allergies or rhinitis.   Marland Kitchen ibuprofen (ADVIL) 200 MG tablet Take 3 tablets (600 mg total) by mouth every 6 (six) hours.  Marland Kitchen levothyroxine (SYNTHROID, LEVOTHROID) 75 MCG tablet Take 75 mcg by mouth daily.     Allergies:   Statins, Codeine, and Levofloxacin   Social History   Socioeconomic History  . Marital status: Single    Spouse name: Not on file  . Number of children: 5  . Years of education: Not on file  . Highest education level: Not on file  Occupational History  . Occupation: Retired  Tobacco Use  . Smoking status: Current Every Day Smoker    Packs/day: 1.00    Years: 40.00    Pack years: 40.00  . Smokeless tobacco: Never Used  . Tobacco comment: Has cut back to 1 ppd she is aware she needs to quit   Substance and Sexual Activity  . Alcohol use: Yes    Alcohol/week: 0.0 standard drinks    Comment: once every two weeks  . Drug use: No  . Sexual activity: Not Currently  Other Topics Concern  . Not on file  Social History Narrative   Single, Lives alone.     5 daughters: 54 grandchildren, 1 great grandson   Retired   Menopause at 69 yrs   Last mammogram 08/2014   Social Determinants of Health   Financial Resource Strain:   . Difficulty of Paying Living Expenses: Not on file  Food Insecurity:   . Worried About Paediatric nurse in the Last Year: Not on file  . Ran Out of Food in the Last Year: Not on file  Transportation Needs:   . Lack of Transportation (Medical): Not on file  . Lack of Transportation (Non-Medical): Not on file  Physical Activity:   . Days of Exercise per Week: Not on file  . Minutes of Exercise per Session: Not on file  Stress:   . Feeling of Stress : Not on file  Social Connections:   . Frequency of  Communication with Friends and Family: Not on file  . Frequency of Social Gatherings with Friends and Family: Not on file  . Attends Religious Services: Not on file  . Active Member of Clubs or Organizations: Not on file  . Attends Archivist Meetings: Not on file  . Marital Status: Not on file    Family History: The patient's family history includes Breast cancer in her mother and sister; Dementia in her sister; Hemophilia in her daughter and grandson; Hypertension in her sister and sister; Stroke in her father and mother.  ROS:   Please see the history of present illness.     All other systems reviewed and are negative.  EKGs/Labs/Other Studies Reviewed:    The following studies were reviewed today:  Myoview 04/24/2017 Study Highlights    Nuclear stress EF: 61%.  The left ventricular ejection fraction is normal (55-65%).  There was no ST segment deviation noted during stress.  The study is normal.  This is a low risk study.   Normal pharmacologic nuclear study with no evidence of prior infarct or ischemia.     EKG:  EKG is ordered today.  The ekg ordered today demonstrates normal sinus rhythm, no significant ST-T wave changes  Recent Labs: 11/23/2019: ALT 11; BUN 6; Creatinine 0.78; Hemoglobin 15.9; Platelet Count 243; Potassium 3.6; Sodium 136  Recent Lipid Panel    Component Value Date/Time   CHOL 180 06/30/2015 0833   TRIG 100 06/30/2015 0833   HDL 46 06/30/2015 0833   CHOLHDL 3.9 06/30/2015 0833   VLDL 20 06/30/2015 0833   LDLCALC 114  (H) 06/30/2015 0833    Physical Exam:    VS:  BP 140/80   Pulse 62   Ht 5\' 5"  (1.651 m)   Wt 147 lb (66.7 kg)   SpO2 94%   BMI 24.46 kg/m     Wt Readings from Last 3 Encounters:  11/25/19 147 lb (66.7 kg)  11/23/19 148 lb 6.4 oz (67.3 kg)  11/18/19 149 lb (67.6 kg)     GEN:  Well nourished, well developed in no acute distress HEENT: Normal NECK: No JVD; No carotid bruits LYMPHATICS: No lymphadenopathy CARDIAC: RRR, no murmurs, rubs, gallops RESPIRATORY:  Clear to auscultation without rales, wheezing or rhonchi  ABDOMEN: Soft, non-tender, non-distended MUSCULOSKELETAL:  No edema; No deformity  SKIN: Warm and dry NEUROLOGIC:  Alert and oriented x 3 PSYCHIATRIC:  Normal affect   ASSESSMENT:    1. Thoracic ascending aortic aneurysm ([place])   2. Essential hypertension   3. Hyperlipidemia, unspecified hyperlipidemia type   4. Hypothyroidism, unspecified type   5. Tobacco abuse    PLAN:    In order of problems listed above:  1. Thoracic aortic aneurysm: She is pending vascular surgery by Dr. Oneida Alar.  She has upcoming left carotid to subclavian bypass near the end of December and subsequent thoracic aortic graft placement in January.  I discussed her case with Dr. Martinique, she had minimal disease on cardiac catheterization in 2007.  Last Myoview was in 2018 which was negative.  She has not had any obvious anginal symptom.  She is cleared to proceed with vascular surgery without further cardiovascular work-up.  2. Hypertension: I recommend she follow-up with [place] in 2 to 3 months so we can adjust her blood pressure medication further with systolic blood pressure goal of 120-130s.  3. Hyperlipidemia: On Zetia  4. Tobacco abuse: Unfortunately she continues to smoke less than 1 pack/day.  We had a long  discussion today regarding correlation between tobacco abuse and coronary artery disease.  We strongly urged her to stop smoking.   Medication Adjustments/Labs and Tests  Ordered: Current medicines are reviewed at length with the patient today.  Concerns regarding medicines are outlined above.  Orders Placed This Encounter  Procedures  . EKG 12-Lead   No orders of the defined types were placed in this encounter.   Patient Instructions  You are cleared for surgery!  Medication Instructions:  Almyra Deforest, PA recommends that you continue on your current medications as directed. Please refer to the Current Medication list given to you today.  *If you need a refill on your cardiac medications before your next appointment, please call your pharmacy*  Follow-Up: At [facility], you and your health needs are our priority.  As part of our continuing mission to provide you with exceptional heart care, we have created designated Provider Care Teams.  These Care Teams include your primary Cardiologist (physician) and Advanced Practice Providers (APPs -  Physician Assistants and Nurse Practitioners) who all work together to provide you with the care you need, when you need it.  Your next appointment:   2 month(s)  The format for your next appointment:   Either In Person or Virtual  Provider:   You may see Minus Breeding, MD or one of the following Advanced Practice Providers on your designated Care Team:    Rosaria Ferries, PA-C  Jory Sims, DNP, ANP  Cadence Kathlen Mody, NP    Signed, Almyra Deforest, [place]  11/27/2019 11:34 PM    [place]

## 2019-11-25 NOTE — Patient Instructions (Signed)
You are cleared for surgery!  Medication Instructions:  Almyra Deforest, PA recommends that you continue on your current medications as directed. Please refer to the Current Medication list given to you today.  *If you need a refill on your cardiac medications before your next appointment, please call your pharmacy*  Follow-Up: At [facility], you and your health needs are our priority.  As part of our continuing mission to provide you with exceptional heart care, we have created designated Provider Care Teams.  These Care Teams include your primary Cardiologist (physician) and Advanced Practice Providers (APPs -  Physician Assistants and Nurse Practitioners) who all work together to provide you with the care you need, when you need it.  Your next appointment:   2 month(s)  The format for your next appointment:   Either In Person or Virtual  Provider:   You may see Minus Breeding, MD or one of the following Advanced Practice Providers on your designated Care Team:    Rosaria Ferries, PA-C  Jory Sims, DNP, ANP  Cadence Kathlen Mody, NP

## 2019-11-26 ENCOUNTER — Ambulatory Visit (HOSPITAL_COMMUNITY)
Admission: RE | Admit: 2019-11-26 | Discharge: 2019-11-26 | Disposition: A | Discharge status: Home / Self Care | Payer: Medicare Other | Source: Ambulatory Visit | Attending: Vascular Surgery | Admitting: Vascular Surgery

## 2019-11-26 DIAGNOSIS — I712 Thoracic aortic aneurysm, without rupture, unspecified: Secondary | ICD-10-CM

## 2019-11-26 LAB — FACTOR 13 ACTIVITY: Factor XIII, Qualitative: NORMAL

## 2019-11-27 ENCOUNTER — Encounter: Payer: Self-pay | Admitting: Physician Assistant

## 2019-11-30 NOTE — Progress Notes (Addendum)
[id number], [place] [place] [address] Phone: [phone] Fax: [phone]  [facility] [phone] - [place], [place] - [address] [place] [address] Phone: [phone] Fax: [phone]      Your procedure is scheduled on Monday December 06, 2019.  Report to [facility] Main Entrance "A" at 7:45 A.M., and check in at the Admitting office.  Call this number if you have problems the morning of surgery:  [phone]    Remember:  Do not eat or drink after midnight the night before your surgery    Take these medicines the morning of surgery with A SIP OF WATER: Acetaminophen (Tylenol) - if needed Amlodipine (Norvasc) Carvedilol (Coreg) Escitalopram (Lexapro) Ezetimibe (Zetia) Fluticasone (Flonase) - if needed Levothyroxine (Synthroid, Levothroid)  7 days prior to surgery STOP taking any Aspirin (unless otherwise instructed by your surgeon), Aleve, Naproxen, Ibuprofen, Motrin, Advil, Goody's, BC's, all herbal medications, fish oil, and all vitamins.  Follow your surgeon's instructions on when to stop Aspirin.  If no instructions were given by your surgeon then you will need to call the office to get those instructions.      The Morning of Surgery  Do not wear jewelry, make-up or nail polish.  Do not wear lotions, powders, or perfumes/colognes, or deodorant  Do not shave 48 hours prior to surgery.  Do not bring valuables to the hospital.  [facility] is not responsible for any belongings or valuables.  If you are a smoker, DO NOT Smoke 24 hours prior to surgery  If you wear a CPAP at night please bring your mask, tubing, and machine the morning of surgery   Remember that you must have someone to transport you home after your surgery, and remain with you for 24 hours if you are discharged the same day.   Please  bring cases for contacts, glasses, hearing aids, dentures or bridgework because it cannot be worn into surgery.    Leave your suitcase in the car.  After surgery it may be brought to your room.  For patients admitted to the hospital, discharge time will be determined by your treatment team.  Patients discharged the day of surgery will not be allowed to drive home.    Special instructions:   [place]- Preparing For Surgery  Before surgery, you can play an important role. Because skin is not sterile, your skin needs to be as free of germs as possible. You can reduce the number of germs on your skin by washing with CHG (chlorahexidine gluconate) Soap before surgery.  CHG is an antiseptic cleaner which kills germs and bonds with the skin to continue killing germs even after washing.    Oral Hygiene is also important to reduce your risk of infection.  Remember - BRUSH YOUR TEETH THE MORNING OF SURGERY WITH YOUR REGULAR TOOTHPASTE  Please do not use if you have an allergy to CHG or antibacterial soaps. If your skin becomes reddened/irritated stop using the CHG.  Do not shave (including legs and underarms) for at least 48 hours prior to first CHG shower. It is OK to shave your face.  Please follow these instructions carefully.   1. Shower the NIGHT BEFORE SURGERY and the MORNING OF SURGERY with CHG Soap.   2. If you chose to wash your hair, wash your hair first as usual with your normal shampoo.  3. After you shampoo,  rinse your hair and body thoroughly to remove the shampoo.  4. Use CHG as you would any other liquid soap. You can apply CHG directly to the skin and wash gently with a scrungie or a clean washcloth.   5. Apply the CHG Soap to your body ONLY FROM THE NECK DOWN.  Do not use on open wounds or open sores. Avoid contact with your eyes, ears, mouth and genitals (private parts). Wash Face and genitals (private parts)  with your normal soap.   6. Wash thoroughly, paying special  attention to the area where your surgery will be performed.  7. Thoroughly rinse your body with warm water from the neck down.  8. DO NOT shower/wash with your normal soap after using and rinsing off the CHG Soap.  9. Pat yourself dry with a CLEAN TOWEL.  10. Wear CLEAN PAJAMAS to bed the night before surgery, wear comfortable clothes the morning of surgery  11. Place CLEAN SHEETS on your bed the night of your first shower and DO NOT SLEEP WITH PETS.    Day of Surgery:  Please shower the morning of surgery with the CHG soap Do not apply any deodorants/lotions. Please wear clean clothes to the hospital/surgery center.   Remember to brush your teeth WITH YOUR REGULAR TOOTHPASTE.   Please read over the following fact sheets that you were given.

## 2019-12-01 ENCOUNTER — Other Ambulatory Visit: Payer: Self-pay | Admitting: *Deleted

## 2019-12-01 ENCOUNTER — Encounter (HOSPITAL_COMMUNITY)
Admission: RE | Admit: 2019-12-01 | Discharge: 2019-12-01 | Disposition: A | Discharge status: Home / Self Care | Payer: Medicare Other | Source: Ambulatory Visit | Attending: Vascular Surgery | Admitting: Vascular Surgery

## 2019-12-01 ENCOUNTER — Telehealth: Payer: Self-pay | Admitting: *Deleted

## 2019-12-01 ENCOUNTER — Encounter (HOSPITAL_COMMUNITY): Payer: Self-pay

## 2019-12-01 ENCOUNTER — Other Ambulatory Visit: Payer: Self-pay

## 2019-12-01 DIAGNOSIS — I712 Thoracic aortic aneurysm, without rupture, unspecified: Secondary | ICD-10-CM

## 2019-12-01 DIAGNOSIS — Z79890 Hormone replacement therapy (postmenopausal): Secondary | ICD-10-CM | POA: Diagnosis not present

## 2019-12-01 DIAGNOSIS — E039 Hypothyroidism, unspecified: Secondary | ICD-10-CM | POA: Insufficient documentation

## 2019-12-01 DIAGNOSIS — I10 Essential (primary) hypertension: Secondary | ICD-10-CM | POA: Diagnosis not present

## 2019-12-01 DIAGNOSIS — Z7982 Long term (current) use of aspirin: Secondary | ICD-10-CM | POA: Insufficient documentation

## 2019-12-01 DIAGNOSIS — F17210 Nicotine dependence, cigarettes, uncomplicated: Secondary | ICD-10-CM | POA: Insufficient documentation

## 2019-12-01 DIAGNOSIS — K219 Gastro-esophageal reflux disease without esophagitis: Secondary | ICD-10-CM | POA: Insufficient documentation

## 2019-12-01 DIAGNOSIS — Z79899 Other long term (current) drug therapy: Secondary | ICD-10-CM | POA: Diagnosis not present

## 2019-12-01 DIAGNOSIS — J449 Chronic obstructive pulmonary disease, unspecified: Secondary | ICD-10-CM | POA: Diagnosis not present

## 2019-12-01 DIAGNOSIS — Z01812 Encounter for preprocedural laboratory examination: Secondary | ICD-10-CM | POA: Diagnosis present

## 2019-12-01 HISTORY — DX: Thoracic aortic aneurysm, without rupture, unspecified: I71.20

## 2019-12-01 HISTORY — DX: Thoracic aortic aneurysm, without rupture: I71.2

## 2019-12-01 HISTORY — DX: Other complications of anesthesia, initial encounter: T88.59XA

## 2019-12-01 LAB — COMPREHENSIVE METABOLIC PANEL
ALT: 15 U/L (ref 0–44)
AST: 18 U/L (ref 15–41)
Albumin: 3.7 g/dL (ref 3.5–5.0)
Alkaline Phosphatase: 89 U/L (ref 38–126)
Anion gap: 10 (ref 5–15)
BUN: 9 mg/dL (ref 8–23)
CO2: 26 mmol/L (ref 22–32)
Calcium: 8.9 mg/dL (ref 8.9–10.3)
Chloride: 99 mmol/L (ref 98–111)
Creatinine, Ser: 0.77 mg/dL (ref 0.44–1.00)
GFR calc Af Amer: >60 mL/min (ref >60)
GFR calc non Af Amer: >60 mL/min (ref >60)
Glucose, Bld: 117 mg/dL — ABNORMAL HIGH (ref 70–99)
Potassium: 3.9 mmol/L (ref 3.5–5.1)
Sodium: 135 mmol/L (ref 135–145)
Total Bilirubin: 0.7 mg/dL (ref 0.3–1.2)
Total Protein: 6.4 g/dL — ABNORMAL LOW (ref 6.5–8.1)

## 2019-12-01 LAB — URINALYSIS, ROUTINE W REFLEX MICROSCOPIC
Bilirubin Urine: NEGATIVE
Glucose, UA: NEGATIVE mg/dL
Hgb urine dipstick: NEGATIVE
Ketones, ur: NEGATIVE mg/dL
Nitrite: POSITIVE — AB
Protein, ur: NEGATIVE mg/dL
Specific Gravity, Urine: 1.014 (ref 1.005–1.030)
pH: 6 (ref 5.0–8.0)

## 2019-12-01 LAB — CBC
HCT: 47 % — ABNORMAL HIGH (ref 36.0–46.0)
Hemoglobin: 15.4 g/dL — ABNORMAL HIGH (ref 12.0–15.0)
MCH: 31 pg (ref 26.0–34.0)
MCHC: 32.8 g/dL (ref 30.0–36.0)
MCV: 94.6 fL (ref 80.0–100.0)
Platelets: 242 10*3/uL (ref 150–400)
RBC: 4.97 MIL/uL (ref 3.87–5.11)
RDW: 15.2 % (ref 11.5–15.5)
WBC: 9.2 10*3/uL (ref 4.0–10.5)
nRBC: 0 % (ref 0.0–0.2)

## 2019-12-01 LAB — VITAMIN C: Vitamin C: <0.1 mg/dL — ABNORMAL LOW (ref 0.4–2.0)

## 2019-12-01 LAB — APTT: aPTT: 28 seconds (ref 24–36)

## 2019-12-01 LAB — SURGICAL PCR SCREEN
MRSA, PCR: NEGATIVE
Staphylococcus aureus: NEGATIVE

## 2019-12-01 LAB — PROTIME-INR
INR: 1 (ref 0.8–1.2)
Prothrombin Time: 12.9 seconds (ref 11.4–15.2)

## 2019-12-01 MED ORDER — SULFAMETHOXAZOLE-TRIMETHOPRIM 800-160 MG PO TABS: 1.0000 | ORAL_TABLET | Freq: Two times a day (BID) | ORAL | 0 refills | Status: DC

## 2019-12-01 NOTE — Telephone Encounter (Signed)
Spoke with patient's daughter to pick up Rx for antibiotic to start due to abnormal Urine at pre-op lab. Increase water intake. Verbalized understanding.

## 2019-12-01 NOTE — Progress Notes (Signed)
PCP - Dr. Coletta Memos, D.  Cardiologist - Dr. Percival Spanish, J.  Chest x-ray - 10/11/2019 (E)  EKG - 11/25/2019 (E)  Stress Test - 04/24/17 (E)  ECHO - 11/15/13 (E)  Cardiac Cath - Denies  AICD-na PM-na LOOP-na  Sleep Study -Denies  CPAP - Denies  LABS- 12/01/2019: CBC, CMP, PT, PTT, T/S, UA, PCR 12/02/2019: COVID  ASA- Cont.  ERAS- No  HA1C-Denies Fasting Blood Sugar -  Checks Blood Sugar _____ times a day  Anesthesia- Yes- EKG/cardiac clearance 11/25/2019  Pt denies having chest pain, sob, or fever at this time. All instructions explained to the pt, with a verbal understanding of the material. Pt agrees to go over the instructions while at home for a better understanding. Pt also instructed to self quarantine after being tested for COVID-19. The opportunity to ask questions was provided.   Coronavirus Screening  Have you experienced the following symptoms:  Cough yes/no: No Fever (>100.56F)  yes/no: No Runny nose yes/no: No Sore throat yes/no: No Difficulty breathing/shortness of breath  yes/no: No  Have you or a family member traveled in the last 14 days and where? yes/no: No   If the patient indicates "YES" to the above questions, their PAT will be rescheduled to limit the exposure to others and, the surgeon will be notified. THE PATIENT WILL NEED TO BE ASYMPTOMATIC FOR 14 DAYS.   If the patient is not experiencing any of these symptoms, the PAT nurse will instruct them to NOT bring anyone with them to their appointment since they may have these symptoms or traveled as well.   Please remind your patients and families that hospital visitation restrictions are in effect and the importance of the restrictions.

## 2019-12-01 NOTE — Progress Notes (Signed)
Becky from Dr. Ruta Hinds office made aware of the abnormal UA results.

## 2019-12-01 NOTE — Progress Notes (Signed)
Your First procedure is scheduled on Mon., Dec. 28, 2020 from 9:45AM-12:43PM             Report to [facility] Entrance "A" at 7:45 A.M.  Your Second procedure is scheduled on Mon., Jan. 4, 2021 from 7:30AM-10:19AM             Report to [facility] Entrance "A" at 5:30AM             Call this number if you have problems the morning of surgery:             [phone]              Remember:             Do not eat or drink after midnight the night before your surgery                          Take these medicines the morning of surgery with A SIP OF WATER: Acetaminophen (Tylenol) - if needed Amlodipine (Norvasc) Carvedilol (Coreg) Escitalopram (Lexapro) Ezetimibe (Zetia) Fluticasone (Flonase) - if needed Levothyroxine (Synthroid, Levothroid)  Follow your surgeon's instructions on when to stop Aspirin.  If no instructions were given by your surgeon then you will need to call the office to get those instructions.   As of today, stop taking any Aspirin (unless otherwise instructed by your surgeon), Aleve, Naproxen, Ibuprofen, Motrin, Advil, Goody's, BC's, all herbal medications, fish oil, and all vitamins.  If you are a smoker, DO NOT Smoke 24 hours prior to surgery  If you wear a CPAP at night please bring your mask, tubing, and machine the morning of surgery.  Special instructions:   [place]- Preparing For Surgery  Before surgery, you can play an important role. Because skin is not sterile, your skin needs to be as free of germs as possible. You can reduce the number of germs on your skin by washing with CHG (chlorahexidine gluconate) Soap before surgery.  CHG is an antiseptic cleaner which kills germs and bonds with the skin to continue killing germs even after washing.    Please do not use if you have an allergy to CHG or antibacterial soaps. If your skin becomes reddened/irritated stop using the CHG.  Do not shave (including legs and underarms) for at  least 48 hours prior to first CHG shower. It is OK to shave your face.  Please follow these instructions carefully.                                                                                                                               1. Shower the NIGHT BEFORE SURGERY and the MORNING OF SURGERY with CHG Soap.   2. If you chose to wash your hair, wash your hair first as usual with your normal shampoo.  3. After you shampoo, rinse your hair and body thoroughly to remove  the shampoo.  4. Use CHG as you would any other liquid soap. You can apply CHG directly to the skin and wash gently with a scrungie or a clean washcloth.   5. Apply the CHG Soap to your body ONLY FROM THE NECK DOWN.  Do not use on open wounds or open sores. Avoid contact with your eyes, ears, mouth and genitals (private parts). Wash Face and genitals (private parts)  with your normal soap.   6. Wash thoroughly, paying special attention to the area where your surgery will be performed.  7. Thoroughly rinse your body with warm water from the neck down.  8. DO NOT shower/wash with your normal soap after using and rinsing off the CHG Soap.  9. Pat yourself dry with a CLEAN TOWEL.  10. Wear CLEAN PAJAMAS to bed the night before surgery, wear comfortable clothes the morning of surgery  11. Place CLEAN SHEETS on your bed the night of your first shower and DO NOT SLEEP WITH PETS.  Day of Surgery: Remember to brush your teeth WITH YOUR REGULAR TOOTHPASTE.             Do not wear jewelry, make-up or nail polish.             Do not wear lotions, powders, or perfumes/colognes, or deodorant             Do not shave 48 hours prior to surgery.             Do not bring valuables to the hospital.             [facility] is not responsible for any belongings or valuables.  Please bring cases for contacts, glasses, hearing aids, dentures or bridgework because it cannot be worn into surgery.   For patients admitted  to the hospital, discharge time will be determined by your treatment team.  Patients discharged the day of surgery will not be allowed to drive home, and someone age 3 and over needs to stay with you for 24 hours.  Please wear clean clothes to the hospital/surgery center.    Please read over the following fact sheets that you were given.                                 Revision History

## 2019-12-02 ENCOUNTER — Telehealth: Payer: Self-pay | Admitting: Hematology and Oncology

## 2019-12-02 ENCOUNTER — Other Ambulatory Visit (HOSPITAL_COMMUNITY)
Admission: RE | Admit: 2019-12-02 | Discharge: 2019-12-02 | Disposition: A | Discharge status: Home / Self Care | Payer: Medicare Other | Source: Ambulatory Visit | Attending: Vascular Surgery | Admitting: Vascular Surgery

## 2019-12-02 ENCOUNTER — Ambulatory Visit: Payer: Medicare Other | Admitting: Adult Health

## 2019-12-02 ENCOUNTER — Encounter (HOSPITAL_COMMUNITY): Payer: Self-pay

## 2019-12-02 DIAGNOSIS — Z01812 Encounter for preprocedural laboratory examination: Secondary | ICD-10-CM | POA: Diagnosis present

## 2019-12-02 DIAGNOSIS — Z20828 Contact with and (suspected) exposure to other viral communicable diseases: Secondary | ICD-10-CM | POA: Insufficient documentation

## 2019-12-02 MED ORDER — CEFAZOLIN SODIUM-DEXTROSE 2-4 GM/100ML-% IV SOLN
2.0000 g | INTRAVENOUS | Status: AC
  Administered 2019-12-06: 2 g via INTRAVENOUS
  Filled 2019-12-02: qty 100

## 2019-12-02 MED ORDER — VITAMIN C 250 MG PO TABS: 500.0000 mg | ORAL_TABLET | Freq: Every day | ORAL | 1 refills | Status: DC

## 2019-12-02 NOTE — Telephone Encounter (Signed)
Called Michaela Guerra to discuss the last result returning from her blood work. The findings are consistent with a Vitamin C deficiency (scurvy). This has implications for increased bleeding risk and poor wound healing. I have called in a prescription for Vitamin C tablets 500 mg daily in preparation for her procedure.   I do not think that her low Vitamin C would preclude surgery, though if her stores are not repleted well she may not heal well from the procedure. Additionally she is a smoker and at high risk already for poor healing.   Surgery scheduled for 12/06/2019. Please allow patient to continue Vitamin C throughout the perioperative period.  Ledell Peoples, MD Department of Hematology/Oncology [place] [facility] Phone: [phone] Pager: [phone] Email: [email or web address]

## 2019-12-02 NOTE — Anesthesia Preprocedure Evaluation (Addendum)
Anesthesia Evaluation  Patient identified by MRN, date of birth, ID band Patient awake    Reviewed: Allergy & Precautions, H&P , NPO status , Patient's Chart, lab work & pertinent test results  Airway Mallampati: II   Neck ROM: full    Dental   Pulmonary COPD, Current Smoker,    breath sounds clear to auscultation       Cardiovascular hypertension, + Peripheral Vascular Disease   Rhythm:regular Rate:Normal  5.5 cm descending thoracic aortic aneurysm   Neuro/Psych PSYCHIATRIC DISORDERS Anxiety Depression CVA    GI/Hepatic GERD  ,  Endo/Other  Hypothyroidism   Renal/GU      Musculoskeletal  (+) Arthritis ,   Abdominal   Peds  Hematology   Anesthesia Other Findings   Reproductive/Obstetrics                             Anesthesia Physical Anesthesia Plan  ASA: III  Anesthesia Plan: General   Post-op Pain Management:    Induction: Intravenous  PONV Risk Score and Plan: 2 and Ondansetron, Dexamethasone and Treatment may vary due to age or medical condition  Airway Management Planned: Oral ETT  Additional Equipment: Arterial line  Intra-op Plan:   Post-operative Plan: Extubation in OR  Informed Consent: I have reviewed the patients History and Physical, chart, labs and discussed the procedure including the risks, benefits and alternatives for the proposed anesthesia with the patient or authorized representative who has indicated his/her understanding and acceptance.       Plan Discussed with: CRNA, Anesthesiologist and Surgeon  Anesthesia Plan Comments: (PAT note written 12/02/2019 by Myra Gianotti, PA-C.  )       Anesthesia Quick Evaluation

## 2019-12-02 NOTE — Progress Notes (Signed)
Anesthesia Chart Review:  Case: [id number] Date/Time: 12/06/19 0930   Procedure: BYPASS GRAFT CAROTID-SUBCLAVIAN LEFT (Left )   Anesthesia type: General   Pre-op diagnosis: LEFT SUBCLAVIAN ARTERY STENOSIS   [facility] ROOM [facility] / [facility]   Surgeons: Elam Dutch, MD      DISCUSSION:  Patient is a 73 year old female recently found to have a descending TAA during work-up following fall.  She is scheduled for left carotid subclavian bypass graft on 12/06/2019 and for thoracic aortic endovascular stent graft with right abdominal conduit on 12/13/2019 by Ruta Hinds, MD. According to Dr. Nona Dell notes, on imaging, "she does not have adequate diameter of her external iliac and common femoral arteries to accept the delivery system for thoracic stent graft repair...she will need a retroperitoneal exposure of the common iliac artery and a conduit placed. She will also need preoperative carotid subclavian bypass in order to have enough room to seal on the top end of the stent."  History includes smoking, HTN, hypothyroidism, HLD, breast cancer (s/p left partial mastectomy, sentinel node biopsies 01/20/06; s/p radiation), COPD, TAA, HLD, CVA (11/2013, denied deficitis), polio (age 89), GERD. Reports she is hard to wake up after anesthesia.  - She has a family history of hemophilia, but no personal history of bleeding issues; however, given her history she did undergo clotting factor studies on 11/23/19 that showed normal coagulation Factor VIII, Factro VII Activity, qualitative Factor XIII, thrombin tim, aPTT, PT/INR, and fibrinogen. Platelet function assay showed Collagen/ADP 72 (0-118 seconds) and Collagen/Epinephrine 236 (0-193) with this pattern indicative of drug induced platelet dysfunction, most commonly seen after aspirin ingestion. Vitamin C low at < 0.1 md/dL.  - Preoperative cardiology input from 11/25/19 by Almyra Deforest, [place] states, "Overall she has been doing well and denies any obvious anginal symptom.   I discussed her case with DOD Dr. Peter Martinique, we do not recommend any further work-up.  She is cleared to proceed with the surgery as a low risk patient. "   She is for presurgical COVID-19 test on 12/01/19. If negative, and otherwise no acute changes then I would anticipate that she can proceed as planned.    VS: BP 123/71   Pulse 62   Temp 36.9 C (Oral)   Resp 17   Ht 5\' 5"  (1.651 m)   Wt 68.1 kg   SpO2 95%   BMI 24.98 kg/m    PROVIDERS: Bernerd Limbo, MD is PCP [facility] Care Everywhere) Minus Breeding, MD is cardiologist   LABS: Labs reviewed: Acceptable for surgery. She was started on antibiotics for UA showing small leukocytes, positive nitrites.  (all labs ordered are listed, but only abnormal results are displayed)  Labs Reviewed  CBC - Abnormal; Notable for the following components:      Result Value   Hemoglobin 15.4 (*)    HCT 47.0 (*)    All other components within normal limits  COMPREHENSIVE METABOLIC PANEL - Abnormal; Notable for the following components:   Glucose, Bld 117 (*)    Total Protein 6.4 (*)    All other components within normal limits  URINALYSIS, ROUTINE W REFLEX MICROSCOPIC - Abnormal; Notable for the following components:   APPearance HAZY (*)    Nitrite POSITIVE (*)    Leukocytes,Ua SMALL (*)    Bacteria, UA MANY (*)    All other components within normal limits  SURGICAL PCR SCREEN  APTT  PROTIME-INR  TYPE AND SCREEN     IMAGES: CTA Chest/abd/pelvis  11/09/19: IMPRESSION: VASCULAR: 1. New penetrating ulcer involving the descending thoracic aorta, measuring up to 1.1 cm. No evidence for an aortic dissection. 2. No change in the large aneurysm involving the posterior aortic arch. This aneurysm extends cephalad with mural thrombus. Overall size of the thoracic aorta at the aneurysm measures 6.6 cm and stable. 3. 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 4. Small splenic artery aneurysms. NONVASCULAR: 1. No  significant change in the right hepatic hemangioma. 2. Mild emphysema. 3. Questionable vascular or small nodular structures adjacent to a small cystic area near the right minor fissure. Recommend attention to this area on follow up imaging.  CXR 10/11/19: IMPRESSION: - Question aneurysmal dilatation in the region of the aortic arch. It may be prudent to correlate with contrast enhanced chest CT to further assess. The aorta is more prominent than on the prior study in this region. - Lungs hyperexpanded. No edema or consolidation. Stable cardiac prominence. No adenopathy evident.   EKG:11/25/19: Normal sinus rhythm Right superior axis deviation    CV: Carotid US 11/26/19: Summary: - Right Carotid: Velocities in the right ICA are consistent with a 1-39% stenosis. - Left Carotid: Velocities in the left ICA are consistent with a 1-39% stenosis. -Vertebrals:  Bilateral vertebral arteries demonstrate antegrade flow. - Subclavians: Normal flow hemodynamics were seen in bilateral subclavian              arteries.   Nuclear stress test 04/24/17:  Nuclear stress EF: 61%.  The left ventricular ejection fraction is normal (55-65%).  There was no ST segment deviation noted during stress.  The study is normal.  This is a low risk study. Normal pharmacologic nuclear study with no evidence of prior infarct or ischemia.   Echo 11/15/13: Study Conclusions  - Left ventricle: The cavity size was normal. Wall thickness  was normal. Systolic function was vigorous. The estimated  ejection fraction was in the range of 65% to 70%. Wall  motion was normal; there were no regional wall motion  abnormalities. Doppler parameters are consistent with  abnormal left ventricular relaxation (grade 1 diastolic  dysfunction).  - Aortic valve: There was no stenosis.  - Mitral valve: Mildly calcified annulus.  - Right ventricle: The cavity size was normal. Systolic  function was  normal.  - Pulmonary arteries: No complete TR doppler jet so unable  to estimate PA systolic pressure.  - Inferior vena cava: The vessel was normal in size; the  respirophasic diameter changes were in the normal range (=  50%); findings are consistent with normal central venous  pressure.  Impressions:  - Normal LV size with vigorous systolic function, EF [id number].  Normal RV size and systolic function. No significant  valvular abnormalities.    Cardiac cath 09/04/06: FINDINGS:  1. The LV showed good LV systolic function.  2. EF of 60-70%.  3. The left main was patent.  4. LAD has 15-20% proximal and 10-15% mid stenosis.  5. Diagonal-one has 10-15% proximal stenosis.  It is a long vessel but      small in caliber.  6. Left circumflex is patent.  7. OM-1 is very very small.  8. OM-2 is small which is patent.  9. OM-3 and OM-4 are very very small.  10.Ramus is very small which is patent.  11.RCA is patent.  12.PDA is small which is diffusely diseased distally.  13.PLV branches are also very small.  They are diffusely diseased      distally.   Past Medical  History:  Diagnosis Date  . Anxiety   . Arthritis   . Bleeding diathesis ([place])    Hx of   . Breast cancer ([place])    Adenocarcinoma, radiation and surgery; at age 53  . Closed fracture of left distal radius   . COLONIC POLYPS, HYPERPLASTIC 07/12/2009   Qualifier: Diagnosis of  By: Amil Amen MD, Benjamine Mola    . Complication of anesthesia    Hard to wake up.  Marland Kitchen COPD (chronic obstructive pulmonary disease) (H[facility])    heavy smoker 1ppd  . CVA (cerebral vascular accident) ([place]) 11/2013   no deficits  . DECREASED HEARING, LEFT EAR 05/03/2010   Qualifier: Diagnosis of  By: Asa Lente MD, Jannifer Rodney   . Depression   . Derangement of anterior horn of lateral meniscus 04/24/2009   Annotation: Underwent right knee arthroscopic surgery 09/20/09: For  chondromalacia and anterolateral meniscectomy Qualifier: Diagnosis of  By:  Amil Amen MD, Benjamine Mola    . GERD (gastroesophageal reflux disease)   . Hyperlipidemia   . Hyperlipidemia   . Hypertension   . Hypothyroidism   . Pneumonia   . Polio    Dx at age 50 yrs  . Smoker    1ppd  . Stroke ([place]) 11/2013  . Thoracic aortic aneurysm (TAA) (H[facility])    6.6 cm posterior arotic arch TAA 11/09/19    Past Surgical History:  Procedure Laterality Date  . BREAST SURGERY Left    lumpectomy  . EYE SURGERY     Bilateral cataracts  . OPEN REDUCTION INTERNAL FIXATION (ORIF) DISTAL RADIAL FRACTURE Left 10/18/2019   Procedure: OPEN REDUCTION INTERNAL FIXATION (ORIF) DISTAL RADIAL FRACTURE;  Surgeon: Milly Jakob, MD;  Location: [place];  Service: Orthopedics;  Laterality: Left;  SURGERY REQUEST TIME: 75 MIN  REGIONAL BLOCK  . TOTAL HIP ARTHROPLASTY Right 10/04/2014   Procedure: TOTAL HIP ARTHROPLASTY ANTERIOR APPROACH;  Surgeon: Mcarthur Rossetti, MD;  Location: [place];  Service: Orthopedics;  Laterality: Right;    MEDICATIONS: . carvedilol (COREG) 12.5 MG tablet  . hydrOXYzine (ATARAX/VISTARIL) 10 MG tablet  . acetaminophen (TYLENOL) 325 MG tablet  . amLODipine (NORVASC) 10 MG tablet  . aspirin 81 MG tablet  . carvedilol (COREG) 6.25 MG tablet  . escitalopram (LEXAPRO) 5 MG tablet  . ezetimibe (ZETIA) 10 MG tablet  . fluticasone (FLONASE) 50 MCG/ACT nasal spray  . ibuprofen (ADVIL) 200 MG tablet  . levothyroxine (SYNTHROID, LEVOTHROID) 75 MCG tablet  . sulfamethoxazole-trimethoprim (BACTRIM DS) 800-160 MG tablet   No current facility-administered medications for this encounter.    Myra Gianotti, PA-C Surgical Short Stay/Anesthesiology [facility] Phone [phone] [facility] Phone [phone] 12/02/2019 10:30 AM

## 2019-12-03 LAB — NOVEL CORONAVIRUS, NAA (HOSP ORDER, SEND-OUT TO REF LAB; TAT 18-24 HRS): SARS-CoV-2, NAA: NOT DETECTED

## 2019-12-06 ENCOUNTER — Inpatient Hospital Stay (HOSPITAL_COMMUNITY): Payer: Medicare Other

## 2019-12-06 ENCOUNTER — Inpatient Hospital Stay (HOSPITAL_COMMUNITY): Payer: Medicare Other | Admitting: Certified Registered"

## 2019-12-06 ENCOUNTER — Encounter (HOSPITAL_COMMUNITY)
Admission: RE | Disposition: A | Discharge status: Home / Self Care | Payer: Self-pay | Source: Home / Self Care | Attending: Vascular Surgery

## 2019-12-06 ENCOUNTER — Inpatient Hospital Stay (HOSPITAL_COMMUNITY)
Admission: RE | Admit: 2019-12-06 | Discharge: 2019-12-07 | DRG: 254 | Disposition: A | Discharge status: Home / Self Care | Payer: Medicare Other | Attending: Vascular Surgery | Admitting: Vascular Surgery

## 2019-12-06 ENCOUNTER — Encounter (HOSPITAL_COMMUNITY): Payer: Self-pay | Admitting: Vascular Surgery

## 2019-12-06 ENCOUNTER — Inpatient Hospital Stay (HOSPITAL_COMMUNITY): Payer: Medicare Other | Admitting: Vascular Surgery

## 2019-12-06 ENCOUNTER — Other Ambulatory Visit: Payer: Self-pay

## 2019-12-06 DIAGNOSIS — Z8249 Family history of ischemic heart disease and other diseases of the circulatory system: Secondary | ICD-10-CM

## 2019-12-06 DIAGNOSIS — Z8673 Personal history of transient ischemic attack (TIA), and cerebral infarction without residual deficits: Secondary | ICD-10-CM | POA: Diagnosis not present

## 2019-12-06 DIAGNOSIS — J449 Chronic obstructive pulmonary disease, unspecified: Secondary | ICD-10-CM | POA: Diagnosis present

## 2019-12-06 DIAGNOSIS — Z7982 Long term (current) use of aspirin: Secondary | ICD-10-CM | POA: Diagnosis not present

## 2019-12-06 DIAGNOSIS — Z96641 Presence of right artificial hip joint: Secondary | ICD-10-CM | POA: Diagnosis present

## 2019-12-06 DIAGNOSIS — I712 Thoracic aortic aneurysm, without rupture, unspecified: Secondary | ICD-10-CM | POA: Diagnosis present

## 2019-12-06 DIAGNOSIS — Z853 Personal history of malignant neoplasm of breast: Secondary | ICD-10-CM

## 2019-12-06 DIAGNOSIS — Z823 Family history of stroke: Secondary | ICD-10-CM | POA: Diagnosis not present

## 2019-12-06 DIAGNOSIS — E039 Hypothyroidism, unspecified: Secondary | ICD-10-CM | POA: Diagnosis present

## 2019-12-06 DIAGNOSIS — E785 Hyperlipidemia, unspecified: Secondary | ICD-10-CM | POA: Diagnosis present

## 2019-12-06 DIAGNOSIS — Z803 Family history of malignant neoplasm of breast: Secondary | ICD-10-CM

## 2019-12-06 DIAGNOSIS — F17210 Nicotine dependence, cigarettes, uncomplicated: Secondary | ICD-10-CM | POA: Diagnosis present

## 2019-12-06 DIAGNOSIS — F329 Major depressive disorder, single episode, unspecified: Secondary | ICD-10-CM | POA: Diagnosis present

## 2019-12-06 DIAGNOSIS — Z832 Family history of diseases of the blood and blood-forming organs and certain disorders involving the immune mechanism: Secondary | ICD-10-CM

## 2019-12-06 DIAGNOSIS — I10 Essential (primary) hypertension: Secondary | ICD-10-CM | POA: Diagnosis present

## 2019-12-06 HISTORY — PX: CAROTID-SUBCLAVIAN BYPASS GRAFT: SHX910

## 2019-12-06 LAB — CBC
HCT: 40.5 % (ref 36.0–46.0)
Hemoglobin: 13.8 g/dL (ref 12.0–15.0)
MCH: 31.6 pg (ref 26.0–34.0)
MCHC: 34.1 g/dL (ref 30.0–36.0)
MCV: 92.7 fL (ref 80.0–100.0)
Platelets: 203 10*3/uL (ref 150–400)
RBC: 4.37 MIL/uL (ref 3.87–5.11)
RDW: 15.8 % — ABNORMAL HIGH (ref 11.5–15.5)
WBC: 11.5 10*3/uL — ABNORMAL HIGH (ref 4.0–10.5)
nRBC: 0 % (ref 0.0–0.2)

## 2019-12-06 SURGERY — CREATION, BYPASS, ARTERIAL, SUBCLAVIAN TO CAROTID, USING GRAFT
Anesthesia: General | Site: Neck | Laterality: Left

## 2019-12-06 MED ORDER — ESCITALOPRAM OXALATE 10 MG PO TABS
5.0000 mg | ORAL_TABLET | Freq: Every day | ORAL | Status: DC
  Administered 2019-12-06 – 2019-12-07 (×2): 5 mg via ORAL
  Filled 2019-12-06 (×2): qty 1

## 2019-12-06 MED ORDER — SUGAMMADEX SODIUM 200 MG/2ML IV SOLN
INTRAVENOUS | Status: DC | PRN
  Administered 2019-12-06: 250 mg via INTRAVENOUS

## 2019-12-06 MED ORDER — FENTANYL CITRATE (PF) 250 MCG/5ML IJ SOLN
INTRAMUSCULAR | Status: AC
  Filled 2019-12-06: qty 5

## 2019-12-06 MED ORDER — OXYCODONE HCL 5 MG PO TABS: 5.0000 mg | ORAL_TABLET | Freq: Once | ORAL | Status: DC | PRN

## 2019-12-06 MED ORDER — LIDOCAINE 2% (20 MG/ML) 5 ML SYRINGE
INTRAMUSCULAR | Status: AC
  Filled 2019-12-06: qty 5

## 2019-12-06 MED ORDER — AMLODIPINE BESYLATE 10 MG PO TABS
10.0000 mg | ORAL_TABLET | Freq: Every day | ORAL | Status: DC
  Administered 2019-12-06 – 2019-12-07 (×2): 10 mg via ORAL
  Filled 2019-12-06 (×2): qty 1

## 2019-12-06 MED ORDER — HYDRALAZINE HCL 20 MG/ML IJ SOLN: 5.0000 mg | INTRAMUSCULAR | Status: DC | PRN

## 2019-12-06 MED ORDER — PROPOFOL 10 MG/ML IV BOLUS
INTRAVENOUS | Status: AC
  Filled 2019-12-06: qty 20

## 2019-12-06 MED ORDER — ESMOLOL HCL 100 MG/10ML IV SOLN
INTRAVENOUS | Status: DC | PRN
  Administered 2019-12-06 (×2): 30 mg via INTRAVENOUS

## 2019-12-06 MED ORDER — METOPROLOL TARTRATE 5 MG/5ML IV SOLN: 2.0000 mg | INTRAVENOUS | Status: DC | PRN

## 2019-12-06 MED ORDER — POTASSIUM CHLORIDE CRYS ER 20 MEQ PO TBCR: 20.0000 meq | EXTENDED_RELEASE_TABLET | Freq: Every day | ORAL | Status: DC | PRN

## 2019-12-06 MED ORDER — ACETAMINOPHEN 325 MG PO TABS: 325.0000 mg | ORAL_TABLET | ORAL | Status: DC | PRN

## 2019-12-06 MED ORDER — LIDOCAINE 2% (20 MG/ML) 5 ML SYRINGE
INTRAMUSCULAR | Status: DC | PRN
  Administered 2019-12-06: 100 mg via INTRAVENOUS
  Administered 2019-12-06: 60 mg via INTRAVENOUS

## 2019-12-06 MED ORDER — PROPOFOL 10 MG/ML IV BOLUS
INTRAVENOUS | Status: DC | PRN
  Administered 2019-12-06: 125 mg via INTRAVENOUS

## 2019-12-06 MED ORDER — SODIUM CHLORIDE 0.9 % IV SOLN
INTRAVENOUS | Status: DC | PRN
  Administered 2019-12-06: 500 mL

## 2019-12-06 MED ORDER — ESMOLOL HCL 100 MG/10ML IV SOLN
INTRAVENOUS | Status: AC
  Filled 2019-12-06: qty 10

## 2019-12-06 MED ORDER — PROTAMINE SULFATE 10 MG/ML IV SOLN
INTRAVENOUS | Status: AC
  Filled 2019-12-06: qty 5

## 2019-12-06 MED ORDER — LIDOCAINE HCL (PF) 1 % IJ SOLN
INTRAMUSCULAR | Status: AC
  Filled 2019-12-06: qty 30

## 2019-12-06 MED ORDER — LACTATED RINGERS IV SOLN: INTRAVENOUS | Status: DC | PRN

## 2019-12-06 MED ORDER — DOCUSATE SODIUM 100 MG PO CAPS
100.0000 mg | ORAL_CAPSULE | Freq: Every day | ORAL | Status: DC
  Filled 2019-12-06: qty 1

## 2019-12-06 MED ORDER — EPHEDRINE SULFATE 50 MG/ML IJ SOLN
INTRAMUSCULAR | Status: DC | PRN
  Administered 2019-12-06 (×3): 5 mg via INTRAVENOUS

## 2019-12-06 MED ORDER — ROCURONIUM BROMIDE 10 MG/ML (PF) SYRINGE
PREFILLED_SYRINGE | INTRAVENOUS | Status: AC
  Filled 2019-12-06: qty 10

## 2019-12-06 MED ORDER — CEFAZOLIN SODIUM-DEXTROSE 2-4 GM/100ML-% IV SOLN
2.0000 g | Freq: Three times a day (TID) | INTRAVENOUS | Status: AC
  Administered 2019-12-06 – 2019-12-07 (×2): 2 g via INTRAVENOUS
  Filled 2019-12-06 (×2): qty 100

## 2019-12-06 MED ORDER — PHENYLEPHRINE 40 MCG/ML (10ML) SYRINGE FOR IV PUSH (FOR BLOOD PRESSURE SUPPORT)
PREFILLED_SYRINGE | INTRAVENOUS | Status: AC
  Filled 2019-12-06: qty 10

## 2019-12-06 MED ORDER — CHLORHEXIDINE GLUCONATE 4 % EX LIQD: 60.0000 mL | Freq: Once | CUTANEOUS | Status: DC

## 2019-12-06 MED ORDER — HEPARIN SODIUM (PORCINE) 5000 UNIT/ML IJ SOLN
5000.0000 [IU] | Freq: Three times a day (TID) | INTRAMUSCULAR | Status: DC
  Administered 2019-12-07: 5000 [IU] via SUBCUTANEOUS
  Filled 2019-12-06: qty 1

## 2019-12-06 MED ORDER — EPHEDRINE 5 MG/ML INJ
INTRAVENOUS | Status: AC
  Filled 2019-12-06: qty 10

## 2019-12-06 MED ORDER — 0.9 % SODIUM CHLORIDE (POUR BTL) OPTIME
TOPICAL | Status: DC | PRN
  Administered 2019-12-06: 1000 mL

## 2019-12-06 MED ORDER — PHENOL 1.4 % MT LIQD: 1.0000 | OROMUCOSAL | Status: DC | PRN

## 2019-12-06 MED ORDER — LEVOTHYROXINE SODIUM 75 MCG PO TABS
75.0000 ug | ORAL_TABLET | Freq: Every day | ORAL | Status: DC
  Administered 2019-12-07: 75 ug via ORAL
  Filled 2019-12-06: qty 1

## 2019-12-06 MED ORDER — TRAMADOL HCL 50 MG PO TABS: 50.0000 mg | ORAL_TABLET | Freq: Four times a day (QID) | ORAL | Status: DC | PRN

## 2019-12-06 MED ORDER — FENTANYL CITRATE (PF) 100 MCG/2ML IJ SOLN
INTRAMUSCULAR | Status: DC | PRN
  Administered 2019-12-06: 100 ug via INTRAVENOUS
  Administered 2019-12-06: 50 ug via INTRAVENOUS

## 2019-12-06 MED ORDER — GLYCOPYRROLATE PF 0.2 MG/ML IJ SOSY
PREFILLED_SYRINGE | INTRAMUSCULAR | Status: DC | PRN
  Administered 2019-12-06 (×2): .1 mg via INTRAVENOUS

## 2019-12-06 MED ORDER — ONDANSETRON HCL 4 MG/2ML IJ SOLN: 4.0000 mg | Freq: Four times a day (QID) | INTRAMUSCULAR | Status: DC | PRN

## 2019-12-06 MED ORDER — EZETIMIBE 10 MG PO TABS
10.0000 mg | ORAL_TABLET | Freq: Every day | ORAL | Status: DC
  Administered 2019-12-06 – 2019-12-07 (×2): 10 mg via ORAL
  Filled 2019-12-06 (×2): qty 1

## 2019-12-06 MED ORDER — HEPARIN SODIUM (PORCINE) 1000 UNIT/ML IJ SOLN
INTRAMUSCULAR | Status: AC
  Filled 2019-12-06: qty 1

## 2019-12-06 MED ORDER — CARVEDILOL 12.5 MG PO TABS
12.5000 mg | ORAL_TABLET | Freq: Two times a day (BID) | ORAL | Status: DC
  Administered 2019-12-06 – 2019-12-07 (×2): 12.5 mg via ORAL
  Filled 2019-12-06 (×2): qty 1

## 2019-12-06 MED ORDER — PROTAMINE SULFATE 10 MG/ML IV SOLN
INTRAVENOUS | Status: DC | PRN
  Administered 2019-12-06: 30 mg via INTRAVENOUS
  Administered 2019-12-06 (×2): 20 mg via INTRAVENOUS

## 2019-12-06 MED ORDER — ALUM & MAG HYDROXIDE-SIMETH 200-200-20 MG/5ML PO SUSP: 15.0000 mL | ORAL | Status: DC | PRN

## 2019-12-06 MED ORDER — ROCURONIUM BROMIDE 50 MG/5ML IV SOSY
PREFILLED_SYRINGE | INTRAVENOUS | Status: DC | PRN
  Administered 2019-12-06: 50 mg via INTRAVENOUS
  Administered 2019-12-06: 30 mg via INTRAVENOUS
  Administered 2019-12-06 (×2): 10 mg via INTRAVENOUS

## 2019-12-06 MED ORDER — ASPIRIN EC 81 MG PO TBEC
81.0000 mg | DELAYED_RELEASE_TABLET | Freq: Every day | ORAL | Status: DC
  Administered 2019-12-06 – 2019-12-07 (×2): 81 mg via ORAL
  Filled 2019-12-06 (×2): qty 1

## 2019-12-06 MED ORDER — ACETAMINOPHEN 325 MG RE SUPP: 325.0000 mg | RECTAL | Status: DC | PRN

## 2019-12-06 MED ORDER — SODIUM CHLORIDE 0.9 % IV SOLN
INTRAVENOUS | Status: AC
  Filled 2019-12-06: qty 1.2

## 2019-12-06 MED ORDER — ONDANSETRON HCL 4 MG/2ML IJ SOLN
INTRAMUSCULAR | Status: DC | PRN
  Administered 2019-12-06 (×2): 4 mg via INTRAVENOUS

## 2019-12-06 MED ORDER — PHENYLEPHRINE HCL (PRESSORS) 10 MG/ML IV SOLN
INTRAVENOUS | Status: DC | PRN
  Administered 2019-12-06: 80 ug via INTRAVENOUS

## 2019-12-06 MED ORDER — LABETALOL HCL 5 MG/ML IV SOLN: 10.0000 mg | INTRAVENOUS | Status: DC | PRN

## 2019-12-06 MED ORDER — PANTOPRAZOLE SODIUM 40 MG PO TBEC
40.0000 mg | DELAYED_RELEASE_TABLET | Freq: Every day | ORAL | Status: DC
  Administered 2019-12-06 – 2019-12-07 (×2): 40 mg via ORAL
  Filled 2019-12-06 (×2): qty 1

## 2019-12-06 MED ORDER — SODIUM CHLORIDE 0.9 % IV SOLN: 500.0000 mL | Freq: Once | INTRAVENOUS | Status: DC | PRN

## 2019-12-06 MED ORDER — FENTANYL CITRATE (PF) 100 MCG/2ML IJ SOLN: 25.0000 ug | INTRAMUSCULAR | Status: DC | PRN

## 2019-12-06 MED ORDER — GLYCOPYRROLATE PF 0.2 MG/ML IJ SOSY
PREFILLED_SYRINGE | INTRAMUSCULAR | Status: AC
  Filled 2019-12-06: qty 1

## 2019-12-06 MED ORDER — MIDAZOLAM HCL 2 MG/2ML IJ SOLN
INTRAMUSCULAR | Status: AC
  Filled 2019-12-06: qty 2

## 2019-12-06 MED ORDER — OXYCODONE HCL 5 MG/5ML PO SOLN: 5.0000 mg | Freq: Once | ORAL | Status: DC | PRN

## 2019-12-06 MED ORDER — SODIUM CHLORIDE 0.9 % IV SOLN: INTRAVENOUS | Status: DC

## 2019-12-06 MED ORDER — HYDROXYZINE HCL 10 MG PO TABS
10.0000 mg | ORAL_TABLET | Freq: Three times a day (TID) | ORAL | Status: DC | PRN
  Filled 2019-12-06: qty 1

## 2019-12-06 MED ORDER — MAGNESIUM SULFATE 2 GM/50ML IV SOLN: 2.0000 g | Freq: Every day | INTRAVENOUS | Status: DC | PRN

## 2019-12-06 MED ORDER — MORPHINE SULFATE (PF) 2 MG/ML IV SOLN: 2.0000 mg | INTRAVENOUS | Status: DC | PRN

## 2019-12-06 MED ORDER — PHENYLEPHRINE HCL-NACL 10-0.9 MG/250ML-% IV SOLN
INTRAVENOUS | Status: DC | PRN
  Administered 2019-12-06: 50 ug/min via INTRAVENOUS

## 2019-12-06 MED ORDER — GUAIFENESIN-DM 100-10 MG/5ML PO SYRP
15.0000 mL | ORAL_SOLUTION | ORAL | Status: DC | PRN
  Administered 2019-12-06: 15 mL via ORAL
  Filled 2019-12-06: qty 15

## 2019-12-06 MED ORDER — DEXAMETHASONE SODIUM PHOSPHATE 4 MG/ML IJ SOLN
INTRAMUSCULAR | Status: DC | PRN
  Administered 2019-12-06: 5 mg via INTRAVENOUS

## 2019-12-06 MED ORDER — HEPARIN SODIUM (PORCINE) 1000 UNIT/ML IJ SOLN
INTRAMUSCULAR | Status: DC | PRN
  Administered 2019-12-06: 7000 [IU] via INTRAVENOUS

## 2019-12-06 SURGICAL SUPPLY — 44 items
CANISTER SUCT 3000ML PPV (MISCELLANEOUS) ×2 IMPLANT
CANNULA VESSEL 3MM 2 BLNT TIP (CANNULA) ×2 IMPLANT
CATH ROBINSON RED A/P 18FR (CATHETERS) IMPLANT
CLIP VESOCCLUDE MED 24/CT (CLIP) ×2 IMPLANT
CLIP VESOCCLUDE SM WIDE 24/CT (CLIP) ×2 IMPLANT
COVER WAND RF STERILE (DRAPES) IMPLANT
DECANTER SPIKE VIAL GLASS SM (MISCELLANEOUS) IMPLANT
DERMABOND ADVANCED (GAUZE/BANDAGES/DRESSINGS) ×1
DERMABOND ADVANCED .7 DNX12 (GAUZE/BANDAGES/DRESSINGS) ×1 IMPLANT
DRAIN HEMOVAC 1/8 X 5 (WOUND CARE) IMPLANT
ELECT REM PT RETURN 9FT ADLT (ELECTROSURGICAL) ×2
ELECTRODE REM PT RTRN 9FT ADLT (ELECTROSURGICAL) ×1 IMPLANT
EVACUATOR SILICONE 100CC (DRAIN) IMPLANT
GAUZE SPONGE 4X4 12PLY STRL (GAUZE/BANDAGES/DRESSINGS) ×2 IMPLANT
GLOVE BIO SURGEON STRL SZ7.5 (GLOVE) ×2 IMPLANT
GLOVE BIOGEL PI IND STRL 6.5 (GLOVE) ×1 IMPLANT
GLOVE BIOGEL PI INDICATOR 6.5 (GLOVE) ×1
GLOVE SS BIOGEL STRL SZ 7 (GLOVE) ×1 IMPLANT
GLOVE SUPERSENSE BIOGEL SZ 7 (GLOVE) ×1
GOWN STRL REUS W/ TWL LRG LVL3 (GOWN DISPOSABLE) ×3 IMPLANT
GOWN STRL REUS W/TWL LRG LVL3 (GOWN DISPOSABLE) ×3
GRAFT HEMASHIELD 8MM (Vascular Products) ×1 IMPLANT
GRAFT VASC STRG 30X8KNIT (Vascular Products) ×1 IMPLANT
HEMOSTAT SPONGE AVITENE ULTRA (HEMOSTASIS) IMPLANT
INSERT FOGARTY SM (MISCELLANEOUS) IMPLANT
KIT BASIN OR (CUSTOM PROCEDURE TRAY) ×2 IMPLANT
KIT TURNOVER KIT B (KITS) ×2 IMPLANT
LOOP VESSEL MINI RED (MISCELLANEOUS) IMPLANT
NEEDLE 22X1 1/2 (OR ONLY) (NEEDLE) IMPLANT
NS IRRIG 1000ML POUR BTL (IV SOLUTION) ×4 IMPLANT
PACK CAROTID (CUSTOM PROCEDURE TRAY) ×2 IMPLANT
PAD ARMBOARD 7.5X6 YLW CONV (MISCELLANEOUS) ×4 IMPLANT
POSITIONER HEAD DONUT 9IN (MISCELLANEOUS) ×2 IMPLANT
PUNCH AORTIC ROTATE 4.0MM (MISCELLANEOUS) ×2 IMPLANT
SHUNT CAROTID BYPASS 10 (VASCULAR PRODUCTS) IMPLANT
SUT PROLENE 6 0 CC (SUTURE) ×6 IMPLANT
SUT PROLENE 7 0 BV 1 (SUTURE) IMPLANT
SUT VIC AB 3-0 SH 27 (SUTURE) ×2
SUT VIC AB 3-0 SH 27X BRD (SUTURE) ×2 IMPLANT
SUT VICRYL 4-0 PS2 18IN ABS (SUTURE) ×2 IMPLANT
SYR CONTROL 10ML LL (SYRINGE) IMPLANT
TOWEL GREEN STERILE (TOWEL DISPOSABLE) ×2 IMPLANT
TRAY FOLEY MTR SLVR 16FR STAT (SET/KITS/TRAYS/PACK) IMPLANT
WATER STERILE IRR 1000ML POUR (IV SOLUTION) ×2 IMPLANT

## 2019-12-06 NOTE — Anesthesia Postprocedure Evaluation (Signed)
Anesthesia Post Note  Patient: Michaela Guerra  Procedure(s) Performed: BYPASS GRAFT CAROTID-SUBCLAVIAN LEFT (Left Neck)     Patient location during evaluation: PACU Anesthesia Type: General Level of consciousness: awake and alert Pain management: pain level controlled Vital Signs Assessment: post-procedure vital signs reviewed and stable Respiratory status: spontaneous breathing, nonlabored ventilation, respiratory function stable and patient connected to nasal cannula oxygen Cardiovascular status: blood pressure returned to baseline and stable Postop Assessment: no apparent nausea or vomiting Anesthetic complications: no    Last Vitals:  Vitals:   12/06/19 1615 12/06/19 1622  BP:  116/60  Pulse: 71 70  Resp: (!) 22 (!) 22  Temp:    SpO2: 94% 96%    Last Pain:  Vitals:   12/06/19 1620  PainSc: 0-No pain                 [name] S

## 2019-12-06 NOTE — Progress Notes (Signed)
Patient last dose of coreg in PTA, patient did not take morning dose. BP 155/777, HR 65. Per Dr Marcie Bal, patient to be given IV med in [place]. Will continue to monitor.

## 2019-12-06 NOTE — Anesthesia Procedure Notes (Signed)
Arterial Line Insertion Start/End12/28/2020 9:45 AM, 12/06/2019 10:03 AM Performed by: Neldon Newport, CRNA, CRNA  Patient location: Pre-op. Preanesthetic checklist: patient identified, IV checked, site marked, risks and benefits discussed, surgical consent, monitors and equipment checked, pre-op evaluation and timeout performed Lidocaine 1% used for infiltration Right, radial was placed Catheter size: 20 G Hand hygiene performed  and maximum sterile barriers used   Attempts: 2 Procedure performed without using ultrasound guided technique. Following insertion, dressing applied. Post procedure assessment: normal and unchanged  Patient tolerated the procedure well with no immediate complications. Additional procedure comments: No biopatch in kit.

## 2019-12-06 NOTE — Anesthesia Procedure Notes (Signed)
Procedure Name: Intubation Date/Time: 12/06/2019 12:27 PM Performed by: Neldon Newport, CRNA Pre-anesthesia Checklist: Timeout performed, Patient being monitored, Suction available, Emergency Drugs available and Patient identified Patient Re-evaluated:Patient Re-evaluated prior to induction Oxygen Delivery Method: Circle system utilized Preoxygenation: Pre-oxygenation with 100% oxygen Induction Type: IV induction Ventilation: Mask ventilation without difficulty and Oral airway inserted - appropriate to patient size Laryngoscope Size: Mac and 3 Grade View: Grade I Tube type: Oral Tube size: 7.0 mm Number of attempts: 1 Placement Confirmation: breath sounds checked- equal and bilateral,  positive ETCO2 and ETT inserted through vocal cords under direct vision Secured at: 21 cm Tube secured with: Tape Dental Injury: Teeth and Oropharynx as per pre-operative assessment

## 2019-12-06 NOTE — Op Note (Signed)
Procedure: Left carotid subclavian bypass  Preoperative diagnosis: Thoracic aortic aneurysm  Postoperative diagnosis: Same  Anesthesia: General  Assistant: Arlee Muslim, PA-C  Indications: Patient is a 73 year old female with a known thoracic aortic aneurysm.  She presents for elective left carotid subclavian bypass in preparation for thoracic stent grafting  Operative findings: #1 8 mm Dacron graft from common carotid to left subclavian artery  2.  Phrenic nerve lies posterior to the graft vagus nerve lies anterior to the graft, jugular vein lies anterior to the graft  Operative details: After team informed consent, the patient was taken to the operating room.  The patient was placed in supine position operating table.  After induction of general anesthesia and endotracheal ovation patient's entire left neck and chest were prepped and draped usual sterile fashion.  Next a supraclavicular incision was made on the left side of the neck about 1 fingerbreadth above the clavicle.  This incorporated the lateral head of the sternocleidomastoid muscle.  Incision was carried down through the subcutaneous tissues down to the level of the platysma muscle.  This was incised along the full length the incision.  The clavicular head of the sternocleidomastoid muscle was identified dissected free circumferentially and then divided with cautery.  Scalene fat pad was then elevated from the medial and inferior portions identifying a portion of the brachial plexus as well as the anterior scalene muscle.  The phrenic nerve could be easily seen coursing across the anterior scalene muscle.  This was carefully dissected off the anterior surface and then the scalene muscle carefully divided with cautery avoiding heat to the area of the phrenic nerve.  Incision was then deepened down to the level of the left subclavian artery.  This was dissected free circumferentially it was soft on palpation.  It was dissected free  circumferentially all the way up to the thyrocervical trunk and then out distally to make enough room for distal clamp site.  A vessel loop was placed around it.  Tension was then turned towards dissection of the common carotid artery.  The internal jugular vein was dissected free circumferentially and reflected posteriorly.  The vagus nerve was identified between the common carotid and jugular vein.  The common carotid artery was dissected free circumferentially.  It was also self lumbar location.  Several centimeters were dissected free circumferentially to provide an appropriate clamping site.  The patient was then given 7000 units of intravenous heparin.  The left subclavian artery was controlled proximally distally with Henley clamps.  A small arteriotomy was made in the artery with an 11 blade and then opened further with a 4 mm punch.  An 8 mm dacryon graft was beveled slightly and sewn end of graft to side of artery using a running 6-0 Prolene suture.  Despite completion anastomosis it was for blood backbled and thoroughly flushed reanastomosed was secured clamps released there is pulsatile flow in the graft immediately.  There was also good distal subclavian artery flow.  The graft was then tunneled anterior to the phrenic nerve posterior to the internal jugular vein and posterior to the vagus nerve.  Common carotid artery was controlled proximally distally with Henley clamps.  A longitudinal opening was made in the common carotid artery and a punch used to create a larger hole a 4 mm punch.  Graft was then cut to length and sewn endograft to side of common carotid artery using a running 6-0 Prolene suture.  His prior to completion anastomosis it was for blood backbled  and thoroughly flushed reanastomosed was secured clamps released there is good pulsatile flow in the graft immediately.  There was good Doppler flow distally in the common carotid and subclavian arteries.  Hemostasis was obtained with the  assistance of direct pressure and 70 mg of protamine.  The scalene fat pad was tacked into place with Vicryl sutures.  The platysma was reapproximated with a running 3-0 Vicryl suture.  Skin was closed with a 4-0 Vicryl subcuticular stitch.  Dermabond was applied.  Patient was awakened in the operating room found to be neurologically intact moving upper and lower extremities symmetrically.  She was taken to recovery room in stable condition.  Ruta Hinds, MD [facility] Office: [phone]

## 2019-12-06 NOTE — Interval H&P Note (Signed)
History and Physical Interval Note:  12/06/2019 8:40 AM  Michaela Guerra  has presented today for surgery, with the diagnosis of LEFT SUBCLAVIAN ARTERY STENOSIS.  The various methods of treatment have been discussed with the patient and family. After consideration of risks, benefits and other options for treatment, the patient has consented to  Procedure(s): BYPASS GRAFT CAROTID-SUBCLAVIAN LEFT (Left) as a surgical intervention.  The patient's history has been reviewed, patient examined, no change in status, stable for surgery.  I have reviewed the patient's chart and labs.  Questions were answered to the patient's satisfaction.     Ruta Hinds

## 2019-12-06 NOTE — Transfer of Care (Signed)
Immediate Anesthesia Transfer of Care Note  Patient: Michaela Guerra  Procedure(s) Performed: BYPASS GRAFT CAROTID-SUBCLAVIAN LEFT (Left Neck)  Patient Location: PACU  Anesthesia Type:General  Level of Consciousness: drowsy  Airway & Oxygen Therapy: Patient Spontanous Breathing and Patient connected to nasal cannula oxygen  Post-op Assessment: Report given to RN, Post -op Vital signs reviewed and stable, Patient moving all extremities and Patient able to stick tongue midline  Post vital signs: Reviewed and stable  Last Vitals:  Vitals Value Taken Time  BP 134/72 12/06/19 1552  Temp    Pulse 81 12/06/19 1558  Resp 25 12/06/19 1558  SpO2 93 % 12/06/19 1558  Vitals shown include unvalidated device data.  Last Pain:  Vitals:   12/06/19 0836  PainSc: 0-No pain         Complications: No apparent anesthesia complications

## 2019-12-07 ENCOUNTER — Encounter: Payer: Self-pay | Admitting: *Deleted

## 2019-12-07 LAB — BASIC METABOLIC PANEL
Anion gap: 8 (ref 5–15)
BUN: 9 mg/dL (ref 8–23)
CO2: 23 mmol/L (ref 22–32)
Calcium: 8.8 mg/dL — ABNORMAL LOW (ref 8.9–10.3)
Chloride: 104 mmol/L (ref 98–111)
Creatinine, Ser: 0.87 mg/dL (ref 0.44–1.00)
GFR calc Af Amer: >60 mL/min (ref >60)
GFR calc non Af Amer: >60 mL/min (ref >60)
Glucose, Bld: 137 mg/dL — ABNORMAL HIGH (ref 70–99)
Potassium: 4 mmol/L (ref 3.5–5.1)
Sodium: 135 mmol/L (ref 135–145)

## 2019-12-07 LAB — CBC
HCT: 40.9 % (ref 36.0–46.0)
Hemoglobin: 13.5 g/dL (ref 12.0–15.0)
MCH: 31.5 pg (ref 26.0–34.0)
MCHC: 33 g/dL (ref 30.0–36.0)
MCV: 95.3 fL (ref 80.0–100.0)
Platelets: 203 10*3/uL (ref 150–400)
RBC: 4.29 MIL/uL (ref 3.87–5.11)
RDW: 15.9 % — ABNORMAL HIGH (ref 11.5–15.5)
WBC: 12 10*3/uL — ABNORMAL HIGH (ref 4.0–10.5)
nRBC: 0 % (ref 0.0–0.2)

## 2019-12-07 LAB — CREATININE, SERUM
Creatinine, Ser: 1.02 mg/dL — ABNORMAL HIGH (ref 0.44–1.00)
GFR calc Af Amer: >60 mL/min (ref >60)
GFR calc non Af Amer: 54 mL/min — ABNORMAL LOW (ref >60)

## 2019-12-07 MED ORDER — TRAMADOL HCL 50 MG PO TABS: 50.0000 mg | ORAL_TABLET | Freq: Four times a day (QID) | ORAL | 0 refills | Status: DC | PRN

## 2019-12-07 NOTE — Discharge Summary (Signed)
Discharge Summary  Patient ID: Michaela Guerra [id number] 73 y.o. 1946-05-03  Admit date: 12/06/2019  Discharge date and time: 12/07/2019  Admitting Physician: Elam Dutch, MD   Discharge Physician: Same  Admission Diagnoses: Thoracic aortic aneurysm without rupture [facility]) [I71.2]  Discharge Diagnoses: Same  Admission Condition: fair  Discharged Condition: fair  Indication for Admission: Thoracic aorta aneurysm  Hospital Course: Ms. Michaela Guerra is a 73 year old female who was brought in as an outpatient for left carotid subclavian bypass graft by Dr. Oneida Alar on 12/06/2019 due to a thoracic aortic aneurysm.  She tolerated the procedure well and was admitted to the hospital overnight.  POD #1 she denies any shortness of breath, strokelike symptoms, or changes in motor or sensation of left arm.  Chest x-ray shows an appropriate shape however left hemidiaphragm and is without pneumothorax or pleural effusion.  She is ready for discharge home this morning.  She will return to the hospital as an outpatient on 12/13/2019 for TEVAR with Dr. Oneida Alar.  She will be prescribed 1 to 2 days of narcotic pain medication for continued postoperative pain control.  Discharge instructions were reviewed with the patient and she voiced her understanding.  She will be discharged home this morning in stable condition.  Consults: None  Treatments: surgery: Left carotid subclavian bypass graft by Dr. Oneida Alar on 12/06/2019  Discharge Exam: See progress note 12/07/2019 Vitals:   12/07/19 0837 12/07/19 0913  BP: 123/65 123/65  Pulse: 71   Resp: 18 18  Temp: 98.6 F (37 C) 98.6 F (37 C)  SpO2: 95%      Disposition: Discharge disposition: 01-Home or Self Care       Patient Instructions:  Allergies as of 12/07/2019      Reactions   Statins Other (See Comments)   Myalgias and fatigue   Codeine Other (See Comments)   REACTION: causes skin to feel like it's crawling   Levofloxacin Nausea Only       Medication List    TAKE these medications   acetaminophen 325 MG tablet Commonly known as: Tylenol Take 2 tablets (650 mg total) by mouth every 6 (six) hours. What changed:   when to take this  reasons to take this   amLODipine 10 MG tablet Commonly known as: NORVASC Take 10 mg by mouth daily.   aspirin EC 81 MG tablet Take 81 mg by mouth at bedtime.   carvedilol 6.25 MG tablet Commonly known as: COREG Take 12.5 mg by mouth 2 (two) times daily.   escitalopram 5 MG tablet Commonly known as: LEXAPRO Take 5 mg by mouth at bedtime.   ezetimibe 10 MG tablet Commonly known as: ZETIA Take 10 mg by mouth at bedtime.   fluticasone 50 MCG/ACT nasal spray Commonly known as: FLONASE Place 1 spray into the nose daily as needed for allergies or rhinitis (seasonal allergies).   hydrOXYzine 10 MG tablet Commonly known as: ATARAX/VISTARIL Take 10 mg by mouth 3 (three) times daily.   ibuprofen 200 MG tablet Commonly known as: Advil Take 3 tablets (600 mg total) by mouth every 6 (six) hours.   levothyroxine 75 MCG tablet Commonly known as: SYNTHROID Take 75 mcg by mouth daily before breakfast.   sulfamethoxazole-trimethoprim 800-160 MG tablet Commonly known as: BACTRIM DS Take 1 tablet by mouth 2 (two) times daily.   traMADol 50 MG tablet Commonly known as: ULTRAM Take 1 tablet (50 mg total) by mouth every 6 (six) hours as needed for moderate pain.   ascorbic  acid 500 MG tablet Commonly known as: VITAMIN C Take 500 mg by mouth daily.   vitamin C 250 MG tablet Commonly known as: ASCORBIC ACID Take 2 tablets (500 mg total) by mouth daily.      Activity: activity as tolerated Diet: regular diet Wound Care: keep wound clean and dry  Patient will return to [facility] as an outpatient for TEVAR on Monday, 12/13/2019  Signed: Dagoberto Ligas, PA-C 12/07/2019 10:27 AM [facility] Office: [phone]

## 2019-12-07 NOTE — Discharge Instructions (Signed)
   [facility] [place]  Discharge Instructions   Carotid Endarterectomy (CEA)  Please refer to the following instructions for your post-procedure care. Your surgeon or physician assistant will discuss any changes with you.  Activity  You are encouraged to walk as much as you can. You can slowly return to normal activities but must avoid strenuous activity and heavy lifting until your doctor tell you it's OK. Avoid activities such as vacuuming or swinging a golf club. You can drive after one week if you are comfortable and you are no longer taking prescription pain medications. It is normal to feel tired for serval weeks after your surgery. It is also normal to have difficulty with sleep habits, eating, and bowel movements after surgery. These will go away with time.  Bathing/Showering  You may shower after you come home. Do not soak in a bathtub, hot tub, or swim until the incision heals completely.  Incision Care  Shower every day. Clean your incision with mild soap and water. Pat the area dry with a clean towel. You do not need a bandage unless otherwise instructed. Do not apply any ointments or creams to your incision. You may have skin glue on your incision. Do not peel it off. It will come off on its own in about one week. Your incision may feel thickened and raised for several weeks after your surgery. This is normal and the skin will soften over time. For Men Only: It's OK to shave around the incision but do not shave the incision itself for 2 weeks. It is common to have numbness under your chin that could last for several months.  Diet  Resume your normal diet. There are no special food restrictions following this procedure. A low fat/low cholesterol diet is recommended for all patients with vascular disease. In order to heal from your surgery, it is CRITICAL to get adequate nutrition. Your body requires vitamins, minerals, and protein. Vegetables are the best  source of vitamins and minerals. Vegetables also provide the perfect balance of protein. Processed food has little nutritional value, so try to avoid this.        Medications  Resume taking all of your medications unless your doctor or physician assistant tells you not to. If your incision is causing pain, you may take over-the- counter pain relievers such as acetaminophen (Tylenol). If you were prescribed a stronger pain medication, please be aware these medications can cause nausea and constipation. Prevent nausea by taking the medication with a snack or meal. Avoid constipation by drinking plenty of fluids and eating foods with a high amount of fiber, such as fruits, vegetables, and grains. Do not take Tylenol if you are taking prescription pain medications.  Follow Up  Our office will schedule a follow up appointment 2-3 weeks following discharge.  Please call us immediately for any of the following conditions  Increased pain, redness, drainage (pus) from your incision site. Fever of 101 degrees or higher. If you should develop stroke (slurred speech, difficulty swallowing, weakness on one side of your body, loss of vision) you should call 911 and go to the nearest emergency room.  Reduce your risk of vascular disease:  Stop smoking. If you would like help call [facility] at 1-800-QUIT-NOW (1-800-784-8669) or [place] at [phone]. Manage your cholesterol Maintain a desired weight Control your diabetes Keep your blood pressure down  If you have any questions, please call the office at [phone].   

## 2019-12-07 NOTE — Progress Notes (Addendum)
  Progress Note    12/07/2019 7:38 AM 1 Day Post-Op  Subjective:  Denies SOB.  Denies stroke like symptoms including slurring speech, changes in vision or one sided weakness.   Vitals:   12/07/19 0400 12/07/19 0711  BP: 121/73   Pulse: 63 71  Resp: (!) 21 (!) 21  Temp: 98.1 F (36.7 C)   SpO2: 94% 96%   Physical Exam: Lungs:  Non labored Incisions:  L subclavian incision c/d/i Extremities:  Palpable L radial pulse; L arm motor and sensation intact Neurologic: A&O; CN grossly intact  CBC    Component Value Date/Time   WBC 12.0 (H) 12/07/2019 0400   RBC 4.29 12/07/2019 0400   HGB 13.5 12/07/2019 0400   HGB 15.9 (H) 11/23/2019 1408   HGB 13.7 05/09/2011 1253   HCT 40.9 12/07/2019 0400   HCT 40.3 05/09/2011 1253   PLT 203 12/07/2019 0400   PLT 243 11/23/2019 1408   PLT 283 05/09/2011 1253   MCV 95.3 12/07/2019 0400   MCV 89.6 05/09/2011 1253   MCH 31.5 12/07/2019 0400   MCHC 33.0 12/07/2019 0400   RDW 15.9 (H) 12/07/2019 0400   RDW 15.2 (H) 05/09/2011 1253   LYMPHSABS 2.3 11/23/2019 1408   LYMPHSABS 2.3 05/09/2011 1253   MONOABS 0.7 11/23/2019 1408   MONOABS 0.8 05/09/2011 1253   EOSABS 0.0 11/23/2019 1408   EOSABS 0.2 05/09/2011 1253   BASOSABS 0.1 11/23/2019 1408   BASOSABS 0.0 05/09/2011 1253    BMET    Component Value Date/Time   NA 135 12/07/2019 0400   K 4.0 12/07/2019 0400   CL 104 12/07/2019 0400   CO2 23 12/07/2019 0400   GLUCOSE 137 (H) 12/07/2019 0400   BUN 9 12/07/2019 0400   CREATININE 0.87 12/07/2019 0400   CREATININE 0.78 11/23/2019 1408   CALCIUM 8.8 (L) 12/07/2019 0400   GFRNONAA >60 12/07/2019 0400   GFRNONAA >60 11/23/2019 1408   GFRAA >60 12/07/2019 0400   GFRAA >60 11/23/2019 1408    INR    Component Value Date/Time   INR 1.0 12/01/2019 1215     Intake/Output Summary (Last 24 hours) at 12/07/2019 [id number] Last data filed at 12/07/2019 0000 Gross per 24 hour  Intake 2350 ml  Output 100 ml  Net 2250 ml      Assessment/Plan:  73 y.o. female is s/p L carotid subclavian bypass 1 Day Post-Op   L arm well perfused with motor and sensation intact Neuro exam remains at baseline Patient feels ready for discharge home Plan for discharge today; scheduled for TEVAR on Monday 12/13/19   Dagoberto Ligas, PA-C [facility] [phone] 12/07/2019 7:38 AM  Agree with above.  Doppler flow in graft. Incision no hematoma D/c home  Ruta Hinds, MD [facility] [place] Office: [phone]

## 2019-12-09 ENCOUNTER — Other Ambulatory Visit (HOSPITAL_COMMUNITY)
Admission: RE | Admit: 2019-12-09 | Discharge: 2019-12-09 | Disposition: A | Discharge status: Home / Self Care | Payer: Medicare Other | Source: Ambulatory Visit | Attending: Vascular Surgery | Admitting: Vascular Surgery

## 2019-12-09 ENCOUNTER — Encounter: Payer: Self-pay | Admitting: Vascular Surgery

## 2019-12-09 DIAGNOSIS — Z01812 Encounter for preprocedural laboratory examination: Secondary | ICD-10-CM | POA: Insufficient documentation

## 2019-12-09 DIAGNOSIS — Z20828 Contact with and (suspected) exposure to other viral communicable diseases: Secondary | ICD-10-CM | POA: Diagnosis not present

## 2019-12-09 NOTE — Progress Notes (Signed)
Michaela Guerra states that she was put on an antibiotic for UTI and has completed the course.  Michaela Guerra states she is going for [employer] test today.

## 2019-12-10 DIAGNOSIS — I719 Aortic aneurysm of unspecified site, without rupture: Secondary | ICD-10-CM

## 2019-12-10 HISTORY — DX: Aortic aneurysm of unspecified site, without rupture: I71.9

## 2019-12-10 LAB — NOVEL CORONAVIRUS, NAA (HOSP ORDER, SEND-OUT TO REF LAB; TAT 18-24 HRS): SARS-CoV-2, NAA: NOT DETECTED

## 2019-12-13 ENCOUNTER — Inpatient Hospital Stay (HOSPITAL_COMMUNITY): Payer: Medicare Other

## 2019-12-13 ENCOUNTER — Other Ambulatory Visit: Payer: Self-pay

## 2019-12-13 ENCOUNTER — Inpatient Hospital Stay (HOSPITAL_COMMUNITY)
Admission: RE | Admit: 2019-12-13 | Discharge: 2019-12-14 | DRG: 221 | Disposition: A | Discharge status: Home / Self Care | Payer: Medicare Other | Attending: Vascular Surgery | Admitting: Vascular Surgery

## 2019-12-13 ENCOUNTER — Inpatient Hospital Stay (HOSPITAL_COMMUNITY): Payer: Medicare Other | Admitting: Certified Registered"

## 2019-12-13 ENCOUNTER — Encounter (HOSPITAL_COMMUNITY): Payer: Self-pay | Admitting: Vascular Surgery

## 2019-12-13 ENCOUNTER — Encounter (HOSPITAL_COMMUNITY)
Admission: RE | Disposition: A | Discharge status: Home / Self Care | Payer: Self-pay | Source: Home / Self Care | Attending: Vascular Surgery

## 2019-12-13 DIAGNOSIS — M1990 Unspecified osteoarthritis, unspecified site: Secondary | ICD-10-CM | POA: Diagnosis not present

## 2019-12-13 DIAGNOSIS — Z79899 Other long term (current) drug therapy: Secondary | ICD-10-CM

## 2019-12-13 DIAGNOSIS — I712 Thoracic aortic aneurysm, without rupture, unspecified: Secondary | ICD-10-CM | POA: Diagnosis present

## 2019-12-13 DIAGNOSIS — I714 Abdominal aortic aneurysm, without rupture: Secondary | ICD-10-CM | POA: Diagnosis present

## 2019-12-13 DIAGNOSIS — Z20822 Contact with and (suspected) exposure to covid-19: Secondary | ICD-10-CM | POA: Diagnosis present

## 2019-12-13 DIAGNOSIS — Z8673 Personal history of transient ischemic attack (TIA), and cerebral infarction without residual deficits: Secondary | ICD-10-CM

## 2019-12-13 DIAGNOSIS — Z853 Personal history of malignant neoplasm of breast: Secondary | ICD-10-CM | POA: Diagnosis not present

## 2019-12-13 DIAGNOSIS — Z79890 Hormone replacement therapy (postmenopausal): Secondary | ICD-10-CM

## 2019-12-13 DIAGNOSIS — Z8249 Family history of ischemic heart disease and other diseases of the circulatory system: Secondary | ICD-10-CM

## 2019-12-13 DIAGNOSIS — Z832 Family history of diseases of the blood and blood-forming organs and certain disorders involving the immune mechanism: Secondary | ICD-10-CM

## 2019-12-13 DIAGNOSIS — E039 Hypothyroidism, unspecified: Secondary | ICD-10-CM | POA: Diagnosis not present

## 2019-12-13 DIAGNOSIS — E785 Hyperlipidemia, unspecified: Secondary | ICD-10-CM | POA: Diagnosis present

## 2019-12-13 DIAGNOSIS — F329 Major depressive disorder, single episode, unspecified: Secondary | ICD-10-CM | POA: Diagnosis not present

## 2019-12-13 DIAGNOSIS — Z7982 Long term (current) use of aspirin: Secondary | ICD-10-CM

## 2019-12-13 DIAGNOSIS — F419 Anxiety disorder, unspecified: Secondary | ICD-10-CM | POA: Diagnosis present

## 2019-12-13 DIAGNOSIS — Z823 Family history of stroke: Secondary | ICD-10-CM

## 2019-12-13 DIAGNOSIS — F17210 Nicotine dependence, cigarettes, uncomplicated: Secondary | ICD-10-CM | POA: Diagnosis not present

## 2019-12-13 DIAGNOSIS — Z803 Family history of malignant neoplasm of breast: Secondary | ICD-10-CM | POA: Diagnosis not present

## 2019-12-13 DIAGNOSIS — H9192 Unspecified hearing loss, left ear: Secondary | ICD-10-CM | POA: Diagnosis not present

## 2019-12-13 DIAGNOSIS — K219 Gastro-esophageal reflux disease without esophagitis: Secondary | ICD-10-CM | POA: Diagnosis present

## 2019-12-13 DIAGNOSIS — I10 Essential (primary) hypertension: Secondary | ICD-10-CM | POA: Diagnosis present

## 2019-12-13 DIAGNOSIS — Z791 Long term (current) use of non-steroidal anti-inflammatories (NSAID): Secondary | ICD-10-CM | POA: Diagnosis not present

## 2019-12-13 HISTORY — PX: THORACIC AORTIC ENDOVASCULAR STENT GRAFT: SHX6112

## 2019-12-13 HISTORY — PX: EMBOLECTOMY: SHX44

## 2019-12-13 LAB — URINALYSIS, ROUTINE W REFLEX MICROSCOPIC
Bilirubin Urine: NEGATIVE
Glucose, UA: NEGATIVE mg/dL
Hgb urine dipstick: NEGATIVE
Ketones, ur: NEGATIVE mg/dL
Nitrite: NEGATIVE
Protein, ur: NEGATIVE mg/dL
Specific Gravity, Urine: 1.032 — ABNORMAL HIGH (ref 1.005–1.030)
WBC, UA: >50 WBC/hpf — ABNORMAL HIGH (ref 0–5)
pH: 5 (ref 5.0–8.0)

## 2019-12-13 LAB — TYPE AND SCREEN
ABO/RH(D): O NEG
ABO/RH(D): O NEG
Antibody Screen: NEGATIVE
Antibody Screen: NEGATIVE

## 2019-12-13 LAB — COMPREHENSIVE METABOLIC PANEL
ALT: 11 U/L (ref 0–44)
AST: 16 U/L (ref 15–41)
Albumin: 2.7 g/dL — ABNORMAL LOW (ref 3.5–5.0)
Alkaline Phosphatase: 74 U/L (ref 38–126)
Anion gap: 7 (ref 5–15)
BUN: 7 mg/dL — ABNORMAL LOW (ref 8–23)
CO2: 27 mmol/L (ref 22–32)
Calcium: 8.7 mg/dL — ABNORMAL LOW (ref 8.9–10.3)
Chloride: 100 mmol/L (ref 98–111)
Creatinine, Ser: 0.73 mg/dL (ref 0.44–1.00)
GFR calc Af Amer: >60 mL/min (ref >60)
GFR calc non Af Amer: >60 mL/min (ref >60)
Glucose, Bld: 160 mg/dL — ABNORMAL HIGH (ref 70–99)
Potassium: 4.2 mmol/L (ref 3.5–5.1)
Sodium: 134 mmol/L — ABNORMAL LOW (ref 135–145)
Total Bilirubin: 0.9 mg/dL (ref 0.3–1.2)
Total Protein: 5.5 g/dL — ABNORMAL LOW (ref 6.5–8.1)

## 2019-12-13 LAB — CBC
HCT: 38.4 % (ref 36.0–46.0)
Hemoglobin: 12.9 g/dL (ref 12.0–15.0)
MCH: 31.2 pg (ref 26.0–34.0)
MCHC: 33.6 g/dL (ref 30.0–36.0)
MCV: 92.8 fL (ref 80.0–100.0)
Platelets: 227 10*3/uL (ref 150–400)
RBC: 4.14 MIL/uL (ref 3.87–5.11)
RDW: 15.1 % (ref 11.5–15.5)
WBC: 12.1 10*3/uL — ABNORMAL HIGH (ref 4.0–10.5)
nRBC: 0 % (ref 0.0–0.2)

## 2019-12-13 LAB — PROTIME-INR
INR: 1 (ref 0.8–1.2)
Prothrombin Time: 12.8 seconds (ref 11.4–15.2)

## 2019-12-13 LAB — POCT ACTIVATED CLOTTING TIME: Activated Clotting Time: 400 seconds

## 2019-12-13 LAB — MAGNESIUM: Magnesium: 1.8 mg/dL (ref 1.7–2.4)

## 2019-12-13 LAB — APTT: aPTT: 31 seconds (ref 24–36)

## 2019-12-13 SURGERY — INSERTION, ENDOVASCULAR STENT GRAFT, AORTA, THORACIC
Anesthesia: General | Site: Chest | Laterality: Right

## 2019-12-13 MED ORDER — DEXTROSE-NACL 5-0.45 % IV SOLN: INTRAVENOUS | Status: AC

## 2019-12-13 MED ORDER — HEPARIN SODIUM (PORCINE) 1000 UNIT/ML IJ SOLN
INTRAMUSCULAR | Status: DC | PRN
  Administered 2019-12-13 (×3): 7000 [IU] via INTRAVENOUS

## 2019-12-13 MED ORDER — SODIUM CHLORIDE 0.9 % IV SOLN
INTRAVENOUS | Status: AC
  Filled 2019-12-13: qty 1.2

## 2019-12-13 MED ORDER — 0.9 % SODIUM CHLORIDE (POUR BTL) OPTIME
TOPICAL | Status: DC | PRN
  Administered 2019-12-13 (×2): 1000 mL

## 2019-12-13 MED ORDER — LACTATED RINGERS IV SOLN: INTRAVENOUS | Status: DC | PRN

## 2019-12-13 MED ORDER — FENTANYL CITRATE (PF) 100 MCG/2ML IJ SOLN
INTRAMUSCULAR | Status: DC | PRN
  Administered 2019-12-13 (×3): 50 ug via INTRAVENOUS
  Administered 2019-12-13: 100 ug via INTRAVENOUS
  Administered 2019-12-13: 50 ug via INTRAVENOUS

## 2019-12-13 MED ORDER — METOPROLOL TARTRATE 5 MG/5ML IV SOLN: 2.0000 mg | INTRAVENOUS | Status: DC | PRN

## 2019-12-13 MED ORDER — CEFAZOLIN SODIUM-DEXTROSE 2-4 GM/100ML-% IV SOLN
2.0000 g | Freq: Three times a day (TID) | INTRAVENOUS | Status: AC
  Administered 2019-12-13 – 2019-12-14 (×2): 2 g via INTRAVENOUS
  Filled 2019-12-13 (×2): qty 100

## 2019-12-13 MED ORDER — FENTANYL CITRATE (PF) 250 MCG/5ML IJ SOLN
INTRAMUSCULAR | Status: AC
  Filled 2019-12-13: qty 5

## 2019-12-13 MED ORDER — ONDANSETRON HCL 4 MG/2ML IJ SOLN
4.0000 mg | Freq: Four times a day (QID) | INTRAMUSCULAR | Status: DC | PRN
  Administered 2019-12-14: 4 mg via INTRAVENOUS
  Filled 2019-12-13: qty 2

## 2019-12-13 MED ORDER — GLYCOPYRROLATE PF 0.2 MG/ML IJ SOSY
PREFILLED_SYRINGE | INTRAMUSCULAR | Status: DC | PRN
  Administered 2019-12-13: .2 mg via INTRAVENOUS

## 2019-12-13 MED ORDER — DOCUSATE SODIUM 100 MG PO CAPS
100.0000 mg | ORAL_CAPSULE | Freq: Every day | ORAL | Status: DC
  Administered 2019-12-14: 100 mg via ORAL
  Filled 2019-12-13: qty 1

## 2019-12-13 MED ORDER — PHENYLEPHRINE 40 MCG/ML (10ML) SYRINGE FOR IV PUSH (FOR BLOOD PRESSURE SUPPORT)
PREFILLED_SYRINGE | INTRAVENOUS | Status: DC | PRN
  Administered 2019-12-13: 80 ug via INTRAVENOUS

## 2019-12-13 MED ORDER — ROCURONIUM BROMIDE 10 MG/ML (PF) SYRINGE
PREFILLED_SYRINGE | INTRAVENOUS | Status: DC | PRN
  Administered 2019-12-13 (×3): 10 mg via INTRAVENOUS
  Administered 2019-12-13: 50 mg via INTRAVENOUS
  Administered 2019-12-13: 10 mg via INTRAVENOUS

## 2019-12-13 MED ORDER — PHENOL 1.4 % MT LIQD: 1.0000 | OROMUCOSAL | Status: DC | PRN

## 2019-12-13 MED ORDER — LIDOCAINE 2% (20 MG/ML) 5 ML SYRINGE
INTRAMUSCULAR | Status: AC
  Filled 2019-12-13: qty 5

## 2019-12-13 MED ORDER — PHENYLEPHRINE 40 MCG/ML (10ML) SYRINGE FOR IV PUSH (FOR BLOOD PRESSURE SUPPORT)
PREFILLED_SYRINGE | INTRAVENOUS | Status: AC
  Filled 2019-12-13: qty 10

## 2019-12-13 MED ORDER — ACETAMINOPHEN 325 MG RE SUPP: 325.0000 mg | RECTAL | Status: DC | PRN

## 2019-12-13 MED ORDER — MIDAZOLAM HCL 5 MG/5ML IJ SOLN
INTRAMUSCULAR | Status: DC | PRN
  Administered 2019-12-13 (×2): 1 mg via INTRAVENOUS

## 2019-12-13 MED ORDER — OXYCODONE HCL 5 MG PO TABS
5.0000 mg | ORAL_TABLET | Freq: Once | ORAL | Status: AC | PRN
  Administered 2019-12-13: 5 mg via ORAL

## 2019-12-13 MED ORDER — LABETALOL HCL 5 MG/ML IV SOLN: 10.0000 mg | INTRAVENOUS | Status: DC | PRN

## 2019-12-13 MED ORDER — SODIUM CHLORIDE 0.9 % IV SOLN: 500.0000 mL | Freq: Once | INTRAVENOUS | Status: DC | PRN

## 2019-12-13 MED ORDER — SENNOSIDES-DOCUSATE SODIUM 8.6-50 MG PO TABS: 1.0000 | ORAL_TABLET | Freq: Every evening | ORAL | Status: DC | PRN

## 2019-12-13 MED ORDER — CHLORHEXIDINE GLUCONATE 4 % EX LIQD: 60.0000 mL | Freq: Once | CUTANEOUS | Status: DC

## 2019-12-13 MED ORDER — ONDANSETRON HCL 4 MG/2ML IJ SOLN
INTRAMUSCULAR | Status: DC | PRN
  Administered 2019-12-13: 4 mg via INTRAVENOUS

## 2019-12-13 MED ORDER — BISACODYL 10 MG RE SUPP: 10.0000 mg | Freq: Every day | RECTAL | Status: DC | PRN

## 2019-12-13 MED ORDER — GUAIFENESIN-DM 100-10 MG/5ML PO SYRP
15.0000 mL | ORAL_SOLUTION | ORAL | Status: DC | PRN
  Administered 2019-12-14: 15 mL via ORAL
  Filled 2019-12-13: qty 15

## 2019-12-13 MED ORDER — HEPARIN SODIUM (PORCINE) 1000 UNIT/ML IJ SOLN
INTRAMUSCULAR | Status: AC
  Filled 2019-12-13: qty 1

## 2019-12-13 MED ORDER — ASPIRIN EC 81 MG PO TBEC
81.0000 mg | DELAYED_RELEASE_TABLET | Freq: Every day | ORAL | Status: DC
  Administered 2019-12-14: 81 mg via ORAL
  Filled 2019-12-13: qty 1

## 2019-12-13 MED ORDER — PHENYLEPHRINE HCL-NACL 10-0.9 MG/250ML-% IV SOLN
INTRAVENOUS | Status: DC | PRN
  Administered 2019-12-13: 50 ug/min via INTRAVENOUS

## 2019-12-13 MED ORDER — GLYCOPYRROLATE PF 0.2 MG/ML IJ SOSY
PREFILLED_SYRINGE | INTRAMUSCULAR | Status: AC
  Filled 2019-12-13: qty 1

## 2019-12-13 MED ORDER — CEFAZOLIN SODIUM-DEXTROSE 2-4 GM/100ML-% IV SOLN
2.0000 g | INTRAVENOUS | Status: AC
  Administered 2019-12-13: 2 g via INTRAVENOUS

## 2019-12-13 MED ORDER — PANTOPRAZOLE SODIUM 40 MG PO TBEC
40.0000 mg | DELAYED_RELEASE_TABLET | Freq: Every day | ORAL | Status: DC
  Administered 2019-12-14: 40 mg via ORAL
  Filled 2019-12-13: qty 1

## 2019-12-13 MED ORDER — MAGNESIUM SULFATE 2 GM/50ML IV SOLN: 2.0000 g | Freq: Every day | INTRAVENOUS | Status: DC | PRN

## 2019-12-13 MED ORDER — FENTANYL CITRATE (PF) 100 MCG/2ML IJ SOLN: 25.0000 ug | INTRAMUSCULAR | Status: DC | PRN

## 2019-12-13 MED ORDER — ROCURONIUM BROMIDE 10 MG/ML (PF) SYRINGE
PREFILLED_SYRINGE | INTRAVENOUS | Status: AC
  Filled 2019-12-13: qty 10

## 2019-12-13 MED ORDER — SODIUM CHLORIDE 0.9 % IV SOLN: INTRAVENOUS | Status: DC

## 2019-12-13 MED ORDER — CEFAZOLIN SODIUM-DEXTROSE 2-4 GM/100ML-% IV SOLN
INTRAVENOUS | Status: AC
  Filled 2019-12-13: qty 100

## 2019-12-13 MED ORDER — PROPOFOL 10 MG/ML IV BOLUS
INTRAVENOUS | Status: AC
  Filled 2019-12-13: qty 20

## 2019-12-13 MED ORDER — MORPHINE SULFATE (PF) 2 MG/ML IV SOLN
2.0000 mg | INTRAVENOUS | Status: DC | PRN
  Administered 2019-12-13 – 2019-12-14 (×4): 2 mg via INTRAVENOUS
  Filled 2019-12-13 (×5): qty 1

## 2019-12-13 MED ORDER — DEXAMETHASONE SODIUM PHOSPHATE 10 MG/ML IJ SOLN
INTRAMUSCULAR | Status: AC
  Filled 2019-12-13: qty 1

## 2019-12-13 MED ORDER — HYDRALAZINE HCL 20 MG/ML IJ SOLN: 5.0000 mg | INTRAMUSCULAR | Status: DC | PRN

## 2019-12-13 MED ORDER — IODIXANOL 320 MG/ML IV SOLN
INTRAVENOUS | Status: DC | PRN
  Administered 2019-12-13: 20 mL
  Administered 2019-12-13: 150 mL

## 2019-12-13 MED ORDER — PROTAMINE SULFATE 10 MG/ML IV SOLN
INTRAVENOUS | Status: DC | PRN
  Administered 2019-12-13: 70 mg via INTRAVENOUS

## 2019-12-13 MED ORDER — HEPARIN SODIUM (PORCINE) 1000 UNIT/ML IJ SOLN
INTRAMUSCULAR | Status: AC
  Filled 2019-12-13: qty 2

## 2019-12-13 MED ORDER — LIDOCAINE 2% (20 MG/ML) 5 ML SYRINGE
INTRAMUSCULAR | Status: DC | PRN
  Administered 2019-12-13: 20 mg via INTRAVENOUS

## 2019-12-13 MED ORDER — PROPOFOL 10 MG/ML IV BOLUS
INTRAVENOUS | Status: DC | PRN
  Administered 2019-12-13: 100 mg via INTRAVENOUS

## 2019-12-13 MED ORDER — ALUM & MAG HYDROXIDE-SIMETH 200-200-20 MG/5ML PO SUSP: 15.0000 mL | ORAL | Status: DC | PRN

## 2019-12-13 MED ORDER — POTASSIUM CHLORIDE CRYS ER 20 MEQ PO TBCR: 20.0000 meq | EXTENDED_RELEASE_TABLET | Freq: Every day | ORAL | Status: DC | PRN

## 2019-12-13 MED ORDER — OXYCODONE HCL 5 MG PO TABS
ORAL_TABLET | ORAL | Status: AC
  Filled 2019-12-13: qty 1

## 2019-12-13 MED ORDER — MIDAZOLAM HCL 2 MG/2ML IJ SOLN
INTRAMUSCULAR | Status: AC
  Filled 2019-12-13: qty 2

## 2019-12-13 MED ORDER — ACETAMINOPHEN 325 MG PO TABS: 325.0000 mg | ORAL_TABLET | ORAL | Status: DC | PRN

## 2019-12-13 MED ORDER — DEXAMETHASONE SODIUM PHOSPHATE 10 MG/ML IJ SOLN
INTRAMUSCULAR | Status: DC | PRN
  Administered 2019-12-13: 10 mg via INTRAVENOUS

## 2019-12-13 MED ORDER — ONDANSETRON HCL 4 MG/2ML IJ SOLN
INTRAMUSCULAR | Status: AC
  Filled 2019-12-13: qty 2

## 2019-12-13 MED ORDER — ONDANSETRON HCL 4 MG/2ML IJ SOLN: 4.0000 mg | Freq: Four times a day (QID) | INTRAMUSCULAR | Status: DC | PRN

## 2019-12-13 MED ORDER — SODIUM CHLORIDE 0.9 % IV SOLN
INTRAVENOUS | Status: DC | PRN
  Administered 2019-12-13 (×2): 500 mL

## 2019-12-13 MED ORDER — OXYCODONE HCL 5 MG/5ML PO SOLN: 5.0000 mg | Freq: Once | ORAL | Status: AC | PRN

## 2019-12-13 SURGICAL SUPPLY — 63 items
CANISTER SUCT 3000ML PPV (MISCELLANEOUS) ×3 IMPLANT
CANNULA VESSEL 3MM 2 BLNT TIP (CANNULA) ×3 IMPLANT
CATH ACCU-VU SIZ PIG 5F 100CM (CATHETERS) ×3 IMPLANT
CATH ANGIO 5F BER2 100CM (CATHETERS) ×3 IMPLANT
CATH BALLN TRILOBE 26-42 (BALLOONS) ×3 IMPLANT
CATH BEACON 5 .035 65 KMP TIP (CATHETERS) ×3 IMPLANT
CATH BEACON 5.038 65CM KMP-01 (CATHETERS) IMPLANT
COIL EMBL 14X103.2 LOOP (Embolic) ×2 IMPLANT
COIL NESTER 14X10 (Embolic) ×1 IMPLANT
COIL NESTER 14X12 (Embolic) ×6 IMPLANT
COVER PROBE W GEL 5X96 (DRAPES) ×3 IMPLANT
DERMABOND ADVANCED (GAUZE/BANDAGES/DRESSINGS) ×1
DERMABOND ADVANCED .7 DNX12 (GAUZE/BANDAGES/DRESSINGS) ×2 IMPLANT
DEVICE CLOSURE PERCLS PRGLD 6F (VASCULAR PRODUCTS) IMPLANT
DRSG TEGADERM 2-3/8X2-3/4 SM (GAUZE/BANDAGES/DRESSINGS) IMPLANT
DRSG TEGADERM 4X4.75 (GAUZE/BANDAGES/DRESSINGS) ×3 IMPLANT
DRYSEAL FLEXSHEATH 22FR 33CM (SHEATH) ×1
ELECT REM PT RETURN 9FT ADLT (ELECTROSURGICAL) ×6
ELECTRODE REM PT RTRN 9FT ADLT (ELECTROSURGICAL) ×4 IMPLANT
GAUZE SPONGE 2X2 8PLY STRL LF (GAUZE/BANDAGES/DRESSINGS) ×2 IMPLANT
GLOVE BIO SURGEON STRL SZ 6.5 (GLOVE) ×6 IMPLANT
GLOVE BIO SURGEON STRL SZ7.5 (GLOVE) ×6 IMPLANT
GLOVE BIOGEL PI IND STRL 7.0 (GLOVE) ×14 IMPLANT
GLOVE BIOGEL PI INDICATOR 7.0 (GLOVE) ×7
GLOVE ECLIPSE 6.5 STRL STRAW (GLOVE) ×3 IMPLANT
GOWN STRL REUS W/ TWL LRG LVL3 (GOWN DISPOSABLE) ×14 IMPLANT
GOWN STRL REUS W/TWL LRG LVL3 (GOWN DISPOSABLE) ×7
GRAFT CV 30X10STRG TUBE (Vascular Products) ×2 IMPLANT
GRAFT HEMASHIELD 10MM (Vascular Products) ×1 IMPLANT
KIT BASIN OR (CUSTOM PROCEDURE TRAY) ×3 IMPLANT
KIT TURNOVER KIT B (KITS) ×3 IMPLANT
NEEDLE PERC 18GX7CM (NEEDLE) ×3 IMPLANT
NS IRRIG 1000ML POUR BTL (IV SOLUTION) ×6 IMPLANT
PACK ENDOVASCULAR (PACKS) ×3 IMPLANT
PAD ARMBOARD 7.5X6 YLW CONV (MISCELLANEOUS) ×6 IMPLANT
PERCLOSE PROGLIDE 6F (VASCULAR PRODUCTS)
SHEATH BRITE TIP 8FR 23CM (SHEATH) ×3 IMPLANT
SHEATH DRYSEAL FLEX 22FR 33CM (SHEATH) ×2 IMPLANT
SHEATH PINNACLE 8F 10CM (SHEATH) ×3 IMPLANT
SPONGE GAUZE 2X2 STER 10/PKG (GAUZE/BANDAGES/DRESSINGS) ×1
SPONGE SURGIFOAM ABS GEL 100 (HEMOSTASIS) IMPLANT
STENT GRFT THORAC ACS 28X28X10 (Endovascular Graft) ×3 IMPLANT
STENT GRFT THORAC ACS 37X37X15 (Endovascular Graft) ×3 IMPLANT
STOPCOCK MORSE 400PSI 3WAY (MISCELLANEOUS) ×6 IMPLANT
SUT PDS AB 1 TP1 54 (SUTURE) ×3 IMPLANT
SUT PROLENE 3 0 SH1 36 (SUTURE) IMPLANT
SUT PROLENE 5 0 C 1 24 (SUTURE) ×6 IMPLANT
SUT PROLENE 5 0 C 1 36 (SUTURE) ×9 IMPLANT
SUT VIC AB 2-0 SH 27 (SUTURE) ×1
SUT VIC AB 2-0 SH 27XBRD (SUTURE) ×2 IMPLANT
SUT VIC AB 3-0 SH 27 (SUTURE) ×1
SUT VIC AB 3-0 SH 27X BRD (SUTURE) ×2 IMPLANT
SUT VIC AB 4-0 PS2 18 (SUTURE) ×3 IMPLANT
SUT VICRYL 4-0 PS2 18IN ABS (SUTURE) ×6 IMPLANT
SYR 50ML LL SCALE MARK (SYRINGE) ×3 IMPLANT
TAPE UMBILICAL COTTON 1/8X30 (MISCELLANEOUS) ×3 IMPLANT
TOWEL GREEN STERILE (TOWEL DISPOSABLE) ×3 IMPLANT
TRAY FOLEY MTR SLVR 16FR STAT (SET/KITS/TRAYS/PACK) ×3 IMPLANT
TUBING CIL FLEX 10 FLL-RA (TUBING) ×3 IMPLANT
TUBING HIGH PRESSURE 120CM (CONNECTOR) ×3 IMPLANT
WIRE BENTSON .035X145CM (WIRE) ×3 IMPLANT
WIRE EMERALD 3MM-J .035X260CM (WIRE) ×3 IMPLANT
WIRE STIFF LUNDERQUIST 260CM (WIRE) ×3 IMPLANT

## 2019-12-13 NOTE — Progress Notes (Addendum)
  Day of Surgery Note    Subjective: Status post TEVAR seen in PACU.  Resting comfortably and easily awakened.  Pain controlled.  She is desirous of something to eat and drink.   Vitals:   12/13/19 1410 12/13/19 1510  BP: 128/65 139/70  Pulse: 69 60  Resp: 19 18  Temp:    SpO2: 96% 92%    Incisions:   Abdominal incision dressing dry and intact. Extremities: TR band in place left wrist.  Left hand is warm.  Dopplerable pedal pulses Cardiac: Regular rhythm and rate Lungs: Nonlabored Abdomen: Soft Approximately 210 cc yellow-colored clear urine output   Assessment/Plan:  This is a 74 y.o. female who is s/p TEVAR after having undergone left subclavian bypass last week.  Stable in PACU.  We will keep n.p.o. overnight.  Continue current treatment plan and observation in progressive care unit   -   Risa Grill, PA-C 12/13/2019 3:39 PM [phone]  Agree with above.  Palpable pedal pulses.  Moves legs/feet  Ruta Hinds, MD [facility] [place] Office: [phone]

## 2019-12-13 NOTE — Transfer of Care (Signed)
Immediate Anesthesia Transfer of Care Note  Patient: Michaela Guerra  Procedure(s) Performed: THORACIC AORTIC ENDOVASCULAR STENT GRAFT WITH RIGHT ABDOMEN CONDUIT (N/A Abdomen) EMBOLECTOMY RIGHT SUBCLAVIAN (Right Chest)  Patient Location: PACU  Anesthesia Type:General  Level of Consciousness: drowsy and patient cooperative  Airway & Oxygen Therapy: Patient Spontanous Breathing and Patient connected to nasal cannula oxygen  Post-op Assessment: Report given to RN and Post -op Vital signs reviewed and stable  Post vital signs: Reviewed and stable  Last Vitals:  Vitals Value Taken Time  BP 130/69 12/13/19 1318  Temp    Pulse 65 12/13/19 1319  Resp 19 12/13/19 1319  SpO2 95 % 12/13/19 1319  Vitals shown include unvalidated device data.  Last Pain:  Vitals:   12/13/19 0622  TempSrc: Oral  PainSc: 3       Patients Stated Pain Goal: 3 ([id number])  Complications: No apparent anesthesia complications

## 2019-12-13 NOTE — Op Note (Addendum)
Procedure:  1.  Retroperitoneal exposure of right common iliac artery with Dacron conduit for placement of stent graft  2.  Placement of 37 x 37 x 15 cm Gore thoracic aneurysm stent graft with coverage of the left subclavian artery  3.  Placement of 28 x 28 x 10 cm Gore thoracic aneurysm stent graft without coverage of the left subclavian artery in addition to the above aneurysm  4.  Retrograde coil embolization of the left subclavian artery  Preoperative diagnosis: Thoracic aortic aneurysm x2  Postoperative diagnosis: Same  Anesthesia: General  Assistant: Risa Grill PA-C  Operative findings:  1.  10 mm Dacron graft right common iliac artery with small cuff left at conclusion of case  2.  37 x 37 x 15 Gore Excluder graft and arch extending from common carotid to mid descending thoracic aorta  3.  28 x 28 x 10 cm Gore thoracic aneurysm stent graft extending from about 4 cm below the proximal graft down to the level of the diaphragm but above the celiac artery  4.  Nester coils left subclavian artery 2 x 12 mm, 1 x 10 mm  Operative details: After the informed consent, patient taken the operating.  The patient placed in supine position operating table.  After induction of general anesthesia and endotracheal intubation a Foley catheter was placed.  A slight bump was placed under the right flank to elevate the right lower abdomen.  The patient was then prepped and draped in usual sterile fashion from the nipples to the knees.  An oblique incision was made in the right lower quadrant about midway between the umbilicus and pubis and extending just above the anterior superior iliac spine.  Incision was carried on through subcutaneous tissues down the level of the anterior abdominal wall fascia.  The fascia along the anterior rectus was incised and the external oblique internal oblique and transversus abdominis muscles and fascia were incised.  Retroperitoneum was then elevated towards the  midline and freed up from the underside of the fascia inferiorly and superiorly.  A small tear was made in the peritoneum and this was repaired with a running 3-0 Vicryl suture.  An Omni retractor was then brought up in the operative field to assist in retraction in pulling the retroperitoneal contents toward the midline.  The ureter was identified and retracted medially.  Common iliac artery was identified and dissected free circumferentially.  A vessel loop was placed around this.  It was fairly soft on palpation but at the iliac bifurcation it was fairly heavily calcified.  There was also some calcific plaque on the posterior wall.  The internal iliac and external iliac arteries were dissected free circumferentially and Vesseloops placed around these.  The patient was then given 7000 units of intravenous heparin.  He was given an additional 2 doses of this during the course of the case.  After 2 minutes circulation time, the common iliac artery was controlled proximally with a cord clamp and the internal/external neck arteries controlled with Vesseloops.  Longitudinal opening was made in the common iliac artery above the area of calcification about 2 cm above the bifurcation.  A 10 mm Dacron graft was beveled and sewn end of graft to side of artery using a running 5-0 Prolene suture.  Despite completion anastomosis it was for blood backbled and thoroughly flushed.  Anastomosis was secured clamps released there is good pulsatile flow in the distal external iliac artery immediately.  Hemostasis was obtained with addition of 1  repair suture.  At this point the graft was tunneled out to just above the groin region through the subcutaneous tunnel and a separate stab incision about 2 cm in length.  Dacron graft was brought through this.  The dacryon graft was then accessed with an introducer needle and an 035 J-wire advanced up into the ascending aorta under fluoroscopic guidance.  A 5 French pigtail catheter was then  advanced over this and the wire swapped out for an 035 Lunderquist wire with the floppy tip.  This was pushed all the way up against the aortic valve.  We then proceeded to place the 24 [place] dry seal sheath through the conduit and up into the descending thoracic aorta.  A 37 x 37 x 15 Gore Excluder stent graft was then advanced up into the mid descending thoracic aorta.  A 5 French pigtail catheter was then placed over the J-wire using a buddy wire technique into the ascending aorta.  Contrast angiogram was performed which showed normal arch anatomy with a normal innominate common carotid right subclavian with antegrade right vertebral flow left common carotid artery was also patent as well as the left subclavian artery.  The patient had had a prior left carotid subclavian bypass but preferential flow was through the native circulation at this point.  The vertebral artery was well visualized.  We then proceeded to try to advance the stent graft to cover the area of the subclavian and get adequate aortic coverage.  However during this portion of the case the guidewire slipped back on 2 occasions due to the tight aortic arch.  Finally were able to get everything in position and the proximal portion of the stent graft was deployed initially it was slightly encroaching on the left common carotid artery.  We brought up to the trilobed balloon and inflated this right at the aortic arch and then pulled back which brought the entire stent graft back slightly so that we were no longer covering the left common carotid artery.  There was a nonfabric strut in this area but this encroached only minimally on the common carotid origin and I felt if we pulled back further we may dislodge the stent all the way into the aneurysm.  Therefore we ballooned the remainder of this and the delivery system was then removed.  Guidewire was left in place but the pigtail catheter was used to perform an additional contrast injection there was  no obvious type I proximal endoleak.  There was still some retrograde filling into the aneurysm sac from the subclavian artery.  We then proceeded to pull all of our systems down to the descending thoracic aorta.  The patient had an additional aneurysm in this location just above the diaphragm.  A 28 x 28 x 10 cm Gore Excluder stent graft was then positioned in this area and then deployed.  Contrast angiogram was also performed on completion which showed no evidence of type I or type II endoleak in this area.  This portion of stent was about 4 cm below the proximal portion of stent.  This was to repair 2 discrete aneurysms.  At this point the guidewires and sheath were removed and the dacryon graft that had been sewn in was controlled proximally with a Harken clamp.  The graft was transected just after its origin and oversewn with a running 5-0 Prolene suture.  At completion anastomosis there was still some bleeding from the original anastomosis at the toe and this was repaired with a  single 5-0 Prolene suture.  Hemostasis was then obtained with the addition of 70 mg of protamine.  Retroperitoneal structures were returned to their normal position.  The transversalis fascia and internal oblique fascia were closed as a unit using a running 2-0 Vicryl suture.  The external oblique fascia and anterior rectus fascia were then closed with a running #1 PDS suture.  The skin was closed with a 4-0 Vicryl subcuticular stitch.  The counterincision that we had used for the conduit was also closed with a 4-0 Vicryl subcuticular stitch.  Dermabond was applied.  We then turned our attention to the patient's left upper extremity.  The left upper extremity was prepped and draped in usual sterile fashion encompassing a previously placed left radial arterial line that had been placed by the anesthesia team.  A micropuncture wire was advanced through this and the radial A-line swapped out for a 4 French radial sheath.  An 035 J-wire  was then advanced up to the proximal subclavian and a 5 [place] KMP catheter placed over this all the way up to the origin of the subclavian.  I then proceeded to deploy to 12 mm and one 10 mm Nester coils to occlude the proximal subclavian.  Contrast angiogram confirmed patency of the left vertebral artery with reflux of contrast only retrograde and none down along the edge of the stent graft at this point.  Guidewire and catheter were removed the 4 French sheath was left in place to be pulled in the holding area.  The patient tolerated procedure well and there were no complications.  The instrument sponge and needle counts correct in the case.  Patient had Doppler signals in both feet at conclusion of the case.  Ruta Hinds, MD [facility] [place] Office: [phone]

## 2019-12-13 NOTE — Anesthesia Procedure Notes (Signed)
Central Venous Catheter Insertion Performed by: Albertha Ghee, MD, anesthesiologist Start/End1/03/2020 7:20 AM, 12/13/2019 7:28 AM Patient location: Pre-op. Preanesthetic checklist: patient identified, IV checked, site marked, risks and benefits discussed, surgical consent, monitors and equipment checked, pre-op evaluation, timeout performed and anesthesia consent Position: Trendelenburg Lidocaine 1% used for infiltration and patient sedated Hand hygiene performed , maximum sterile barriers used  and Seldinger technique used Catheter size: 7 Fr Central line was placed.Double lumen Procedure performed using ultrasound guided technique. Ultrasound Notes:anatomy identified, needle tip was noted to be adjacent to the nerve/plexus identified, no ultrasound evidence of intravascular and/or intraneural injection and image(s) printed for medical record Attempts: 1 Following insertion, line sutured, dressing applied and Biopatch. Post procedure assessment: blood return through all ports, free fluid flow and no air  Patient tolerated the procedure well with no immediate complications.

## 2019-12-13 NOTE — Anesthesia Preprocedure Evaluation (Signed)
Anesthesia Evaluation  Patient identified by MRN, date of birth, ID band Patient awake    Reviewed: Allergy & Precautions, H&P , NPO status , Patient's Chart, lab work & pertinent test results  Airway Mallampati: II   Neck ROM: full    Dental   Pulmonary pneumonia, COPD, Current Smoker,    breath sounds clear to auscultation       Cardiovascular hypertension, + Peripheral Vascular Disease   Rhythm:regular Rate:Normal  5.5 cm descending thoracic aortic aneurysm   Neuro/Psych PSYCHIATRIC DISORDERS Anxiety Depression CVA    GI/Hepatic GERD  ,  Endo/Other  Hypothyroidism   Renal/GU      Musculoskeletal  (+) Arthritis ,   Abdominal   Peds  Hematology   Anesthesia Other Findings   Reproductive/Obstetrics                             Anesthesia Physical  Anesthesia Plan  ASA: III  Anesthesia Plan: General   Post-op Pain Management:    Induction: Intravenous  PONV Risk Score and Plan: 2 and Ondansetron, Dexamethasone and Treatment may vary due to age or medical condition  Airway Management Planned: Oral ETT  Additional Equipment: Arterial line  Intra-op Plan:   Post-operative Plan: Extubation in OR  Informed Consent: I have reviewed the patients History and Physical, chart, labs and discussed the procedure including the risks, benefits and alternatives for the proposed anesthesia with the patient or authorized representative who has indicated his/her understanding and acceptance.       Plan Discussed with: CRNA, Anesthesiologist and Surgeon  Anesthesia Plan Comments: (PAT note written 12/02/2019 by Myra Gianotti, PA-C.  )        Anesthesia Quick Evaluation

## 2019-12-13 NOTE — Anesthesia Procedure Notes (Signed)
Arterial Line Insertion Start/End1/03/2020 6:40 AM, 12/13/2019 6:55 AM Performed by: Moshe Salisbury, CRNA, CRNA  Patient location: Pre-op. Preanesthetic checklist: patient identified, IV checked, site marked, risks and benefits discussed, surgical consent, monitors and equipment checked, pre-op evaluation and timeout performed Lidocaine 1% used for infiltration and patient sedated Left, radial was placed Catheter size: 20 G Hand hygiene performed , maximum sterile barriers used  and Seldinger technique used Allen's test indicative of satisfactory collateral circulation Attempts: 3 Procedure performed using ultrasound guided technique. Ultrasound Notes:anatomy identified, needle tip was noted to be adjacent to the nerve/plexus identified and no ultrasound evidence of intravascular and/or intraneural injection Following insertion, Biopatch and dressing applied. Post procedure assessment: normal  Patient tolerated the procedure well with no immediate complications.

## 2019-12-13 NOTE — Anesthesia Procedure Notes (Signed)
Procedure Name: Intubation Date/Time: 12/13/2019 7:50 AM Performed by: Moshe Salisbury, CRNA Pre-anesthesia Checklist: Patient identified, Emergency Drugs available, Suction available and Patient being monitored Patient Re-evaluated:Patient Re-evaluated prior to induction Oxygen Delivery Method: Circle System Utilized Preoxygenation: Pre-oxygenation with 100% oxygen Induction Type: IV induction Ventilation: Mask ventilation without difficulty Laryngoscope Size: Mac and 3 Grade View: Grade II Tube type: Oral Number of attempts: 1 Airway Equipment and Method: Stylet Placement Confirmation: ETT inserted through vocal cords under direct vision,  positive ETCO2 and breath sounds checked- equal and bilateral Secured at: 20 cm Tube secured with: Tape Dental Injury: Teeth and Oropharynx as per pre-operative assessment

## 2019-12-13 NOTE — H&P (Signed)
REASON FOR CONSULT: Thoracic aneurysm  HPI:  Michaela Guerra is a 74 y.o. female, recently seen as a hospital consult after a ground-level fall. 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 This was asymptomatic. He has no family history of aneurysms. She has no significant chest trauma in the past. Other medical problems include breast cancer, COPD, hypertension, hyperlipidemia, ongoing tobacco abuse. In conversation with the patient's daughter today apparently there is also some family history of hemophilia. The patient apparently has had a hip replacement in the past though and did not have any bleeding problems. The daughter has requested though that we evaluate this fully before considering any aneurysm repair. She currently has no chest pain. She has no back pain. He has not had any prior abdominal surgery. She is on Zetia and aspirin.      Past Medical History:  Diagnosis Date  . AAA (abdominal aortic aneurysm) ([place])    detected on CT scan 11-2-saw vascular surgeon in ED  . Anxiety   . Arthritis   . Bleeding diathesis ([place])    Hx of   . Breast cancer ([place])    Adenocarcinoma, radiation and surgery; at age 36  . Closed fracture of left distal radius   . COLONIC POLYPS, HYPERPLASTIC 07/12/2009   Qualifier: Diagnosis of By: Amil Amen MD, Benjamine Mola   . COPD (chronic obstructive pulmonary disease) ([facility])    heavy smoker 1ppd  . CVA (cerebral vascular accident) ([place]) 11/2013   no deficits  . DECREASED HEARING, LEFT EAR 05/03/2010   Qualifier: Diagnosis of By: Asa Lente MD, Jannifer Rodney   . Depression   . Derangement of anterior horn of lateral meniscus 04/24/2009   Annotation: Underwent right knee arthroscopic surgery 09/20/09: For chondromalacia and anterolateral meniscectomy Qualifier: Diagnosis of By: Amil Amen MD, Benjamine Mola   . GERD (gastroesophageal reflux disease)   . Hyperlipidemia   .  Hyperlipidemia   . Hypertension   . Hypothyroidism   . Polio    Dx at age 9 yrs  . Smoker    1ppd  . Stroke [facility]) 11/2013        Past Surgical History:  Procedure Laterality Date  . BREAST SURGERY Left    lumpectomy  . OPEN REDUCTION INTERNAL FIXATION (ORIF) DISTAL RADIAL FRACTURE Left 10/18/2019   Procedure: OPEN REDUCTION INTERNAL FIXATION (ORIF) DISTAL RADIAL FRACTURE; Surgeon: Milly Jakob, MD; Location: [facility]; Service: Orthopedics; Laterality: Left; SURGERY REQUEST TIME: 75 MIN  REGIONAL BLOCK  . TOTAL HIP ARTHROPLASTY Right 10/04/2014   Procedure: TOTAL HIP ARTHROPLASTY ANTERIOR APPROACH; Surgeon: Mcarthur Rossetti, MD; Location: [facility]; Service: Orthopedics; Laterality: Right;        Family History  Problem Relation Age of Onset  . Breast cancer Mother   . Stroke Mother   . Stroke Father   . Breast cancer Sister   . Hypertension Sister   . Hypertension Sister   . Dementia Sister    SOCIAL HISTORY:  Social History        Socioeconomic History  . Marital status: Single    Spouse name: Not on file  . Number of children: 5  . Years of education: Not on file  . Highest education level: Not on file  Occupational History  . Occupation: Retired  Tobacco Use  . Smoking status: Current Every Day Smoker    Packs/day: 1.00    Years: 40.00  Pack years: 40.00  . Smokeless tobacco: Never Used  . Tobacco comment: Has cut back to 1 ppd she is aware she needs to quit   Substance and Sexual Activity  . Alcohol use: Yes    Alcohol/week: 0.0 standard drinks    Comment: once every two weeks  . Drug use: No  . Sexual activity: Not Currently  Other Topics Concern  . Not on file  Social History Narrative   Single, Lives alone.    5 daughters: 54 grandchildren, 1 great grandson   Retired   Menopause at 89 yrs   Last mammogram 08/2014   Social Determinants of Health      Financial Resource Strain:   . Difficulty of Paying Living Expenses:  Not on file  Food Insecurity:   . Worried About Charity fundraiser in the Last Year: Not on file  . Ran Out of Food in the Last Year: Not on file  Transportation Needs:   . Lack of Transportation (Medical): Not on file  . Lack of Transportation (Non-Medical): Not on file  Physical Activity:   . Days of Exercise per Week: Not on file  . Minutes of Exercise per Session: Not on file  Stress:   . Feeling of Stress : Not on file  Social Connections:   . Frequency of Communication with Friends and Family: Not on file  . Frequency of Social Gatherings with Friends and Family: Not on file  . Attends Religious Services: Not on file  . Active Member of Clubs or Organizations: Not on file  . Attends Archivist Meetings: Not on file  . Marital Status: Not on file  Intimate Partner Violence:   . Fear of Current or Ex-Partner: Not on file  . Emotionally Abused: Not on file  . Physically Abused: Not on file  . Sexually Abused: Not on file        Allergies  Allergen Reactions  . Statins Other (See Comments)    Myalgias and fatigue  . Codeine     REACTION: causes skin to feel like it's crawling  . Levofloxacin     REACTION: nausea         Current Outpatient Medications  Medication Sig Dispense Refill  . acetaminophen (TYLENOL) 325 MG tablet Take 2 tablets (650 mg total) by mouth every 6 (six) hours.    Marland Kitchen amLODipine (NORVASC) 10 MG tablet Take by mouth.    Marland Kitchen aspirin 81 MG tablet Take 81 mg by mouth daily.    Marland Kitchen escitalopram (LEXAPRO) 5 MG tablet Take 5 mg by mouth daily.    Marland Kitchen ezetimibe (ZETIA) 10 MG tablet Take 1 tablet by mouth daily.    . fluticasone (FLONASE) 50 MCG/ACT nasal spray Place 1 spray into the nose as needed.    . hydrOXYzine (ATARAX/VISTARIL) 10 MG tablet Take by mouth.    Marland Kitchen ibuprofen (ADVIL) 200 MG tablet Take 3 tablets (600 mg total) by mouth every 6 (six) hours. 30 tablet 0  . levothyroxine (SYNTHROID, LEVOTHROID) 75 MCG tablet Take 75 mcg by mouth daily.  0   . carvedilol (COREG) 6.25 MG tablet Take 1 tablet by mouth 2 (two) times daily.     No current facility-administered medications for this visit.   ROS:  General: No weight loss, Fever, chills  HEENT: No recent headaches, no nasal bleeding, no visual changes, no sore throat  Neurologic: No dizziness, blackouts, seizures. No recent symptoms of stroke or mini- stroke. No recent episodes of  slurred speech, or temporary blindness.  Cardiac: No recent episodes of chest pain/pressure, no shortness of breath at rest. + shortness of breath with exertion. Denies history of atrial fibrillation or irregular heartbeat  Vascular: No history of rest pain in feet. No history of claudication. No history of non-healing ulcer, No history of DVT  Pulmonary: No home oxygen, no productive cough, no hemoptysis, No asthma or wheezing  Musculoskeletal: [X]  Arthritis, [ ]  Low back pain, [ ]  Joint pain  Hematologic:No history of hypercoagulable state. No history of easy bleeding. No history of anemia  Gastrointestinal: No hematochezia or melena, No gastroesophageal reflux, no trouble swallowing  Urinary: [ ]  chronic Kidney disease, [ ]  on HD - [ ]  MWF or [ ]  TTHS, [ ]  Burning with urination, [ ]  Frequent urination, [ ]  Difficulty urinating;  Skin: No rashes  Psychological: No history of anxiety, No history of depression    Physical Examination   Vitals:   12/13/19 0622 12/13/19 0718  BP: 128/70   Resp: 18   Temp: 98.4 F (36.9 C)   TempSrc: Oral   SpO2: 96%   Weight:  68 kg  Height:  5\' 4"  (1.626 m)   Body mass index is 24.79 kg/m.  General: Alert and oriented, no acute distress  HEENT: Normal  Neck: NoJVD  Pulmonary: Clear to auscultation bilaterally  Cardiac: Regular Rate and Rhythm  Abdomen: Soft, non-tender, non-distended, no mass  Skin: No rash  Extremity Pulses: 2+ radial, brachial, femoral, dorsalis pedis, posterior tibial pulses bilaterally  Musculoskeletal: No deformity or edema   Neurologic: Upper and lower extremity motor 5/5 and symmetric  DATA:  Reviewed the patient's repeat CT scan which was performed last week approximately 3 weeks after her initial CT scan. 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 There is a new penetrating aortic ulcer just below this in the descending thoracic aorta about 1 cm in size. I reviewed all of these images.  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 On review of the x-rays she does not have adequate diameter of her external iliac and common femoral arteries to accept the delivery system for thoracic stent graft repair. There showed for she will need a retroperitoneal exposure of the common iliac artery and a conduit placed. She will also need preoperative carotid subclavian bypass in order to have enough room to seal on the top end of the stent. All of these findings and pathology were discussed with the patient and her daughter today.  PLAN:   S/p left SCA bypass last week  Patient for a thoracic stent graft repair of her thoracic aortic aneurysm. This will require a retroperitoneal exposure of the right common iliac artery since her external iliac arteries are too small to allow the delivery system. Risk benefits possible complications and procedure details related to this including not limited to bleeding infection paraplegia vessel injury renal dysfunction were all discussed with the patient   Ruta Hinds, MD  [facility]: [phone]  Pager: [phone]

## 2019-12-14 ENCOUNTER — Other Ambulatory Visit: Payer: Self-pay

## 2019-12-14 ENCOUNTER — Encounter (HOSPITAL_COMMUNITY): Payer: Self-pay | Admitting: Vascular Surgery

## 2019-12-14 LAB — CBC
HCT: 31.7 % — ABNORMAL LOW (ref 36.0–46.0)
Hemoglobin: 10.5 g/dL — ABNORMAL LOW (ref 12.0–15.0)
MCH: 31.2 pg (ref 26.0–34.0)
MCHC: 33.1 g/dL (ref 30.0–36.0)
MCV: 94.1 fL (ref 80.0–100.0)
Platelets: 214 10*3/uL (ref 150–400)
RBC: 3.37 MIL/uL — ABNORMAL LOW (ref 3.87–5.11)
RDW: 15.2 % (ref 11.5–15.5)
WBC: 12.3 10*3/uL — ABNORMAL HIGH (ref 4.0–10.5)
nRBC: 0 % (ref 0.0–0.2)

## 2019-12-14 MED ORDER — CARVEDILOL 12.5 MG PO TABS
12.5000 mg | ORAL_TABLET | Freq: Two times a day (BID) | ORAL | Status: DC
  Administered 2019-12-14: 12.5 mg via ORAL
  Filled 2019-12-14: qty 1

## 2019-12-14 MED ORDER — CHLORHEXIDINE GLUCONATE CLOTH 2 % EX PADS
6.0000 | MEDICATED_PAD | Freq: Every day | CUTANEOUS | Status: DC
  Administered 2019-12-14: 6 via TOPICAL

## 2019-12-14 MED ORDER — HEPARIN SODIUM (PORCINE) 5000 UNIT/ML IJ SOLN
5000.0000 [IU] | Freq: Three times a day (TID) | INTRAMUSCULAR | Status: DC
  Administered 2019-12-14 (×2): 5000 [IU] via SUBCUTANEOUS
  Filled 2019-12-14 (×2): qty 1

## 2019-12-14 MED ORDER — ESCITALOPRAM OXALATE 10 MG PO TABS: 5.0000 mg | ORAL_TABLET | Freq: Every day | ORAL | Status: DC

## 2019-12-14 MED ORDER — HYDROXYZINE HCL 10 MG PO TABS
10.0000 mg | ORAL_TABLET | Freq: Three times a day (TID) | ORAL | Status: DC
  Filled 2019-12-14 (×3): qty 1

## 2019-12-14 MED ORDER — OXYCODONE-ACETAMINOPHEN 5-325 MG PO TABS: 1.0000 | ORAL_TABLET | Freq: Four times a day (QID) | ORAL | 0 refills | Status: DC | PRN

## 2019-12-14 MED ORDER — AMLODIPINE BESYLATE 10 MG PO TABS
10.0000 mg | ORAL_TABLET | Freq: Every day | ORAL | Status: DC
  Administered 2019-12-14: 10 mg via ORAL
  Filled 2019-12-14: qty 1

## 2019-12-14 MED ORDER — EZETIMIBE 10 MG PO TABS: 10.0000 mg | ORAL_TABLET | Freq: Every day | ORAL | Status: DC

## 2019-12-14 MED ORDER — OXYCODONE-ACETAMINOPHEN 5-325 MG PO TABS: 1.0000 | ORAL_TABLET | Freq: Four times a day (QID) | ORAL | Status: DC | PRN

## 2019-12-14 MED ORDER — LEVOTHYROXINE SODIUM 75 MCG PO TABS
75.0000 ug | ORAL_TABLET | Freq: Every day | ORAL | Status: DC
  Administered 2019-12-14: 75 ug via ORAL
  Filled 2019-12-14: qty 1

## 2019-12-14 NOTE — Progress Notes (Addendum)
Progress Note    12/14/2019 7:15 AM 1 Day Post-Op  Subjective:  74 yo female who is s/p TEVAR after having undergone left subclavian bypass last week.  This morning she is awake and alert asking for coffee.  Pain is controlled.  Her Foley was just discontinued and she has not yet voided spontaneously.  1 episode of nausea with no vomiting.  No reported flatus.  Nursing reports she is dropping her oxygen saturations to the high 80s without supplemental oxygen.  She denies wheezing or cough.   Vitals:   12/14/19 0608 12/14/19 0610  BP: 134/70 134/70  Pulse:  80  Resp:  19  Temp:    SpO2:  91%    Physical Exam: Cardiac: Heart rate and rhythm are regular Lungs: Clear to auscultation bilaterally Incisions: Left neck, abdominal and counter incisions well approximated and dry. Extremities: Palpable 2+ bilateral dorsalis pedis pulses 2+ left radial pulse.  Dressing dry and intact without hematoma. Abdomen: Soft, nondistended with normoactive bowel sounds  CBC    Component Value Date/Time   WBC 12.1 (H) 12/13/2019 1332   RBC 4.14 12/13/2019 1332   HGB 12.9 12/13/2019 1332   HGB 15.9 (H) 11/23/2019 1408   HGB 13.7 05/09/2011 1253   HCT 38.4 12/13/2019 1332   HCT 40.3 05/09/2011 1253   PLT 227 12/13/2019 1332   PLT 243 11/23/2019 1408   PLT 283 05/09/2011 1253   MCV 92.8 12/13/2019 1332   MCV 89.6 05/09/2011 1253   MCH 31.2 12/13/2019 1332   MCHC 33.6 12/13/2019 1332   RDW 15.1 12/13/2019 1332   RDW 15.2 (H) 05/09/2011 1253   LYMPHSABS 2.3 11/23/2019 1408   LYMPHSABS 2.3 05/09/2011 1253   MONOABS 0.7 11/23/2019 1408   MONOABS 0.8 05/09/2011 1253   EOSABS 0.0 11/23/2019 1408   EOSABS 0.2 05/09/2011 1253   BASOSABS 0.1 11/23/2019 1408   BASOSABS 0.0 05/09/2011 1253    BMET    Component Value Date/Time   NA 134 (L) 12/13/2019 1332   K 4.2 12/13/2019 1332   CL 100 12/13/2019 1332   CO2 27 12/13/2019 1332   GLUCOSE 160 (H) 12/13/2019 1332   BUN 7 (L) 12/13/2019 1332   CREATININE 0.73 12/13/2019 1332   CREATININE 0.78 11/23/2019 1408   CALCIUM 8.7 (L) 12/13/2019 1332   GFRNONAA >60 12/13/2019 1332   GFRNONAA >60 11/23/2019 1408   GFRAA >60 12/13/2019 1332   GFRAA >60 11/23/2019 1408     Intake/Output Summary (Last 24 hours) at 12/14/2019 0715 Last data filed at 12/14/2019 0700 Gross per 24 hour  Intake 3700.02 ml  Output 1675 ml  Net 2025.02 ml    HOSPITAL MEDICATIONS Scheduled Meds: . aspirin EC  81 mg Oral Q0600  . Chlorhexidine Gluconate Cloth  6 each Topical Daily  . docusate sodium  100 mg Oral Daily  . pantoprazole  40 mg Oral Daily   Continuous Infusions: . sodium chloride    . dextrose 5 % and 0.45% NaCl 125 mL/hr at 12/14/19 0216  . magnesium sulfate bolus IVPB     PRN Meds:.sodium chloride, acetaminophen **OR** acetaminophen, alum & mag hydroxide-simeth, bisacodyl, guaiFENesin-dextromethorphan, hydrALAZINE, labetalol, magnesium sulfate bolus IVPB, metoprolol tartrate, morphine injection, ondansetron, phenol, potassium chloride, senna-docusate  Assessment:  74 y.o. female is s/p:  female s/p TEVAR after having undergone left subclavian bypass last week.  Afebrile, stable vital signs.  Advance diet.  Restart home meds 1 Day Post-Op  Plan: -Advance diet.  Restart home meds.   -  DVT prophylaxis: Begin heparin subcutaneously   Risa Grill, PA-C [facility] [phone] 12/14/2019  7:15 AM   Agree with above.  Palpable pedal and left radial pulse.  Incisions look good. Plan for d/c home today if she tolerates PO intake and is ambulatory Needs CT in one month  Ruta Hinds, MD [facility] Office: [phone]

## 2019-12-14 NOTE — Discharge Instructions (Signed)
Thoracic Aortic Aneurysm  An aneurysm is a bulge in an artery. It happens when blood pushes up against a weakened or damaged artery wall. A thoracic aortic aneurysm is an aneurysm that occurs in the first part of the aorta, between the heart and the diaphragm. The aorta is the main artery of the body. It supplies blood from the heart to the rest of the body. Some aneurysms may not cause symptoms or problems. However, a thoracic aortic aneurysm can cause two serious problems:  It can enlarge and burst (rupture).  It can cause blood to flow between the layers of the wall of the aorta through a tear (aortic dissection). Both of these problems are medical emergencies. They can cause bleeding inside the body and can be life-threatening if they are not diagnosed and treated right away. What are the causes? The exact cause of this condition is not known. What increases the risk? The following factors may make you more likely to develop this condition:  Being 52 years of age or older.  Having a family history of aneurysms.  Using tobacco.  Having any of these conditions: ? Hardening of the arteries caused by the buildup of fat and other substances in the lining of a blood vessel (arteriosclerosis). ? Inflammation of the walls of an artery (arteritis). ? A genetic disease that weakens the body's connective tissue, such as Marfan syndrome. ? An injury or trauma to the aorta. ? High blood pressure (hypertension). ? High cholesterol. ? An infection from bacteria, such as syphilis or staphylococcus, in the wall of the aorta (infectious aortitis). What are the signs or symptoms? Symptoms of this condition vary depending on the size of the aneurysm and how fast it is growing. Most grow slowly and do not cause symptoms. When symptoms do occur, they may include:  Pain in the chest, back, sides, or abdomen. The pain may vary in intensity. Sudden, severe pain may indicate that the aneurysm has  ruptured.  Hoarseness.  Cough.  Shortness of breath.  Swallowing problems.  Swelling in the face, arms, or legs.  Fever.  Unexplained weight loss. How is this diagnosed? This condition may be diagnosed with:  An ultrasound.  X-rays.  CT scan.  MRI.  A test to check the arteries for damage or blockages (angiogram). Most unruptured thoracic aortic aneurysms cause no symptoms, so they are often found during exams for other conditions. How is this treated? Treatment for this condition depends on:  The size of the aneurysm.  How fast the aneurysm is growing.  Your age.  Risk factors for rupture. Small aneurysms (2.2 inches, or 5.5 cm, or less) may be managed with:  Medicines to: ? Control blood pressure. ? Manage pain. ? Fight infection.  Regular monitoring. This may include an ultrasound or CT scan every year or every 6 months to see if the aneurysm is getting bigger. Large or fast-growing aneurysms may be treated with surgery. Follow these instructions at home: Eating and drinking   Eat a healthy diet. Your health care provider may recommend that you: ? Lower your salt (sodium) intake. In some people, too much salt can raise blood pressure and increase the risk for thoracic aortic aneurysm. ? Avoid foods that are high in saturated fat and cholesterol, such as red meat and full-fat dairy. ? Eat a diet that is low in sugar. ? Increase your fiber intake by including whole grains, vegetables, and fruits in your diet. Eating these foods may help to lower your  blood pressure.  Do not drink alcohol if your health care provider tells you not to drink.  If you drink alcohol: ? Limit how much you use to:  0-1 drink a day for women.  0-2 drinks a day for men. ? Be aware of how much alcohol is in your drink. In the [place] one drink equals one 12 oz bottle of beer (355 mL), one 5 oz glass of wine (148 mL), or one 1 oz glass of hard liquor (44 mL). Lifestyle  Do not  use any products that contain nicotine or tobacco, such as cigarettes, e-cigarettes, and chewing tobacco. If you need help quitting, ask your health care provider.  Maintain a healthy weight.  Check your blood pressure regularly. Follow your health care provider's instructions on how to keep your blood pressure within normal limits.  Have your blood sugar (glucose) level and cholesterol levels checked regularly. Follow your health care provider's instructions on how to keep levels within normal limits. Activity   Stay physically active and exercise regularly. Talk with your health care provider about how often you should exercise and ask which types of exercise are best for you.  Avoid heavy lifting and activities that take a lot of effort. Ask your health care provider what activities are safe for you. General instructions  Take over-the-counter and prescription medicines only as told by your health care provider.  Talk with your health care provider about regular screenings to see if the aneurysm is getting bigger.  Keep all follow-up visits as told by your health care provider. This is important. Contact a health care provider if you have:  Unexplained weight loss. Get help right away if you have:  Pain in your upper back, neck, or abdomen. This pain may move into your chest and arms.  Trouble swallowing.  A cough or hoarseness.  Shortness of breath. Summary  A thoracic aortic aneurysm is an aneurysm that occurs in the first part of the aorta, between the heart and the diaphragm.  As a thoracic aortic aneurysm becomes larger, it can burst (rupture), or blood can flow between the layers of the wall of the aorta through a tear (aorticdissection). These conditions can be life-threatening if they are not diagnosed and treated right away.  If you have a thoracic aortic aneurysm, its growth will be closely monitored. Surgical repair may be needed for larger or faster-growing  aneurysms. This information is not intended to replace advice given to you by your health care provider. Make sure you discuss any questions you have with your health care provider. Document Revised: 07/14/2018 Document Reviewed: 07/15/2018 Elsevier Patient Education  [place].   [facility]   Discharge Instructions  Endovascular Aortic Aneurysm Repair  Please refer to the following instructions for your post-procedure care. Your surgeon or Physician Assistant will discuss any changes with you.  Activity  You are encouraged to walk as much as you can. You can slowly return to normal activities but must avoid strenuous activity and heavy lifting until your doctor tells you it's OK. Avoid activities such as vacuuming or swinging a gold club. It is normal to feel tired for several weeks after your surgery. Do not drive until your doctor gives the OK and you are no longer taking prescription pain medications. It is also normal to have difficulty with sleep habits, eating, and bowel movements after surgery. These will go away with time.  Bathing/Showering  Shower daily after you go home.  Do not soak in a bathtub, hot tub, or swim until the incision heals completely.  If you have incisions in your groin, wash the groin wounds with soap and water daily and pat dry. (No tub bath-only shower)  Then put a dry gauze or washcloth there to keep this area dry to help prevent wound infection daily and as needed.  Do not use Vaseline or neosporin on your incisions.  Only use soap and water on your incisions and then protect and keep dry.  Incision Care  Shower every day. Clean your incision with mild soap and water. Pat the area dry with a clean towel. You do not need a bandage unless otherwise instructed. Do not apply any ointments or creams to your incision. If you clothing is irritating, you may cover your incision with a dry gauze pad.  Diet  Resume your  normal diet. There are no special food restrictions following this procedure. A low fat/low cholesterol diet is recommended for all patients with vascular disease. In order to heal from your surgery, it is CRITICAL to get adequate nutrition. Your body requires vitamins, minerals, and protein. Vegetables are the best source of vitamins and minerals. Vegetables also provide the perfect balance of protein. Processed food has little nutritional value, so try to avoid this.  Medications  Resume taking all of your medications unless your doctor or nurse practitioner tells you not to. If your incision is causing pain, you may take over-the-counter pain relievers such as acetaminophen (Tylenol). If you were prescribed a stronger pain medication, please be aware these medications can cause nausea and constipation. Prevent nausea by taking the medication with a snack or meal. Avoid constipation by drinking plenty of fluids and eating foods with a high amount of fiber, such as fruits, vegetables, and grains.  Do not take Tylenol if you are taking prescription pain medications.   Follow up  [place] office will schedule a follow-up appointment with a CT scan 3-4 weeks after your surgery.  Please call us immediately for any of the following conditions  . Severe or worsening pain in your legs or feet or in your abdomen back or chest. . Increased pain, redness, drainage (pus) from your incision site. . Increased abdominal pain, bloating, nausea, vomiting or persistent diarrhea. . Fever of 101 degrees or higher. . Swelling in your leg (s), .  Reduce your risk of vascular disease  .Stop smoking. If you would like help call [facility] at 1-800-QUIT-NOW [phone]) or [place] at [phone]. .Manage your cholesterol .Maintain a desired weight .Control your diabetes .Keep your blood pressure down  If you have questions, please call the office at [phone].

## 2019-12-14 NOTE — Progress Notes (Signed)
SATURATION QUALIFICATIONS: (This note is used to comply with regulatory documentation for home oxygen)  Patient Saturations on Room Air at Rest = 90%  Patient Saturations on Room Air while Ambulating = 78%  Patient Saturations on 2 Liters of oxygen while Ambulating = 94%  Please briefly explain why patient needs home oxygen:  While ambulating 200 feet in hallway, pt desaturated to the upper 70's without the use of supplemental [place] O2.

## 2019-12-14 NOTE — TOC Transition Note (Signed)
Transition of Care [facility]) - CM/SW Discharge Note Marvetta Gibbons RN, BSN Transitions of Care Unit 4E- RN Case Manager [phone]   Patient Details  Name: Michaela Guerra MRN: [id number] Date of Birth: Apr 28, 1946  Transition of Care [facility]) CM/SW Contact:  Dawayne Patricia, RN Phone Number: 12/14/2019, 3:57 PM   Clinical Narrative:    Noted order for home 02 needs, pt has qualifying note placed per RN. Call made to [facility] with [place] for DME needs- once pt is qualified for home 02- portable tank to be delivered to room for transport home.  1545- per Thedore Mins with [employer] pt has been approved and is pending delivery of 02 tank.    Final next level of care: Home/Self Care Barriers to Discharge: No Barriers Identified   Patient Goals and CMS Choice Patient states their goals for this hospitalization and ongoing recovery are:: return home CMS Medicare.gov Compare Post Acute Care list provided to:: Patient Choice offered to / list presented to : Patient  Discharge Placement               Home        Discharge Plan and Services   Discharge Planning Services: CM Consult Post Acute Care Choice: Durable Medical Equipment          DME Arranged: Oxygen DME Agency: [employer] Date DME Agency Contacted: 12/14/19 Time DME Agency Contacted: 1330 Representative spoke with at DME Agency: [place]: NA [place] Agency: NA        Social Determinants of Health ([place]) Interventions     Readmission Risk Interventions Readmission Risk Prevention Plan 12/14/2019  Transportation Screening Complete  PCP or Specialist Appt within 5-7 Days Complete  Home Care Screening Complete  Medication Review (RN CM) Complete  Some recent data might be hidden

## 2019-12-14 NOTE — Progress Notes (Signed)
Right IJ removed per MD order without difficulty.  Pt educated on bedrest and BP cycling.  Will continue to monitor.

## 2019-12-15 NOTE — Anesthesia Postprocedure Evaluation (Signed)
Anesthesia Post Note  Patient: Michaela Guerra  Procedure(s) Performed: THORACIC AORTIC ENDOVASCULAR STENT GRAFT WITH RIGHT ABDOMEN CONDUIT (N/A Abdomen) EMBOLECTOMY RIGHT SUBCLAVIAN (Right Chest)     Patient location during evaluation: PACU Anesthesia Type: General Level of consciousness: awake and alert Pain management: pain level controlled Vital Signs Assessment: post-procedure vital signs reviewed and stable Respiratory status: spontaneous breathing, nonlabored ventilation, respiratory function stable and patient connected to nasal cannula oxygen Cardiovascular status: blood pressure returned to baseline and stable Postop Assessment: no apparent nausea or vomiting Anesthetic complications: no    Last Vitals:  Vitals:   12/14/19 0835 12/14/19 1400  BP: 140/76 125/62  Pulse: 86 65  Resp: 20 18  Temp: 36.8 C   SpO2: 93% (!) 88%    Last Pain:  Vitals:   12/14/19 1000  TempSrc:   PainSc: 0-No pain                 [name] S

## 2019-12-21 ENCOUNTER — Telehealth: Payer: Self-pay

## 2019-12-21 NOTE — Discharge Summary (Signed)
EVAR Discharge Summary   Michaela Guerra 07/15/1946 74 y.o. female  MRN: [id number]  Admission Date: 12/13/2019  Discharge Date: 12/14/2019  Physician: Oneida Alar, MD Admission Diagnosis: Aneurysm of thoracic aorta [facility]) [I71.2]  Discharge Day services:     Physical Exam: Vitals:   12/14/19 0835 12/14/19 1400  BP: 140/76 125/62  Pulse: 86 65  Resp: 20 18  Temp: 98.3 F (36.8 C)   SpO2: 93% (!) 88%   General: Awake, alert and in no apparent distress Cardiac: Heart rate and rhythm are regular Lungs: Lungs are clear to auscultation bilaterally Incisions: Right abdominal and left upper chest incisions are healing without signs of infection Extremities: Dopplerable pedal pulses.  She moves all extremities well Abdomen: Soft, nondistended, normoactive bowel sounds Neurologic: Cranial nerves II through XII intact.  Alert and oriented x4  Hospital Course:  The patient was admitted to the hospital and taken to the operating room on 12/13/2019 and underwent: 1.  Retroperitoneal exposure of right common iliac artery with Dacron conduit for placement of stent graft  2.  Placement of 37 x 37 x 15 cm Gore thoracic aneurysm stent graft with coverage of the left subclavian artery  3.  Placement of 28 x 28 x 10 cm Gore thoracic aneurysm stent graft without coverage of the left subclavian artery in addition to the above aneurysm  4.  Retrograde coil embolization of the left subclavian artery  The pt tolerated the procedure well and was transported to the PACU in excellent condition.   The patient remained hemodynamically stable throughout her hospital course.  She was noted to have drop in her oxygen saturation without O2 via nasal cannula.  Her abdominal incision was well approximated.  Left supraclavicular incision from previous subclavian carotid bypass was healing without signs of infection.  She was able to move all extremities without deficits.  She was kept n.p.o. after midnight and  she tolerated her diet without abdominal pain, nausea or vomiting on postoperative day 1.  Her left radial arterial puncture site was without hematoma.  She had 2+ radial pulse.  She had dopplerable pulses in both feet.  She was voiding spontaneously with excellent urine output. The remainder of the hospital course consisted of increasing mobilization and increasing intake of solids without difficulty.  CBC    Component Value Date/Time   WBC 12.3 (H) 12/14/2019 0721   RBC 3.37 (L) 12/14/2019 0721   HGB 10.5 (L) 12/14/2019 0721   HGB 15.9 (H) 11/23/2019 1408   HGB 13.7 05/09/2011 1253   HCT 31.7 (L) 12/14/2019 0721   HCT 40.3 05/09/2011 1253   PLT 214 12/14/2019 0721   PLT 243 11/23/2019 1408   PLT 283 05/09/2011 1253   MCV 94.1 12/14/2019 0721   MCV 89.6 05/09/2011 1253   MCH 31.2 12/14/2019 0721   MCHC 33.1 12/14/2019 0721   RDW 15.2 12/14/2019 0721   RDW 15.2 (H) 05/09/2011 1253   LYMPHSABS 2.3 11/23/2019 1408   LYMPHSABS 2.3 05/09/2011 1253   MONOABS 0.7 11/23/2019 1408   MONOABS 0.8 05/09/2011 1253   EOSABS 0.0 11/23/2019 1408   EOSABS 0.2 05/09/2011 1253   BASOSABS 0.1 11/23/2019 1408   BASOSABS 0.0 05/09/2011 1253    BMET    Component Value Date/Time   NA 134 (L) 12/13/2019 1332   K 4.2 12/13/2019 1332   CL 100 12/13/2019 1332   CO2 27 12/13/2019 1332   GLUCOSE 160 (H) 12/13/2019 1332   BUN 7 (L) 12/13/2019 1332  CREATININE 0.73 12/13/2019 1332   CREATININE 0.78 11/23/2019 1408   CALCIUM 8.7 (L) 12/13/2019 1332   GFRNONAA >60 12/13/2019 1332   GFRNONAA >60 11/23/2019 1408   GFRAA >60 12/13/2019 1332   GFRAA >60 11/23/2019 1408       Discharge Instructions    Discharge patient   Complete by: As directed    Discharge disposition: 01-Home or Self Care   Discharge patient date: 12/14/2019      Discharge Diagnosis:  Aneurysm of thoracic aorta ([place]) [I71.2]  Secondary Diagnosis: Patient Active Problem List   Diagnosis Date Noted   Aneurysm of  thoracic aorta ([place]) 12/13/2019   Thoracic aortic aneurysm without rupture ([place]) 12/06/2019   Leg swelling 04/17/2017   Precordial chest pain 04/17/2017   Other fatigue 04/17/2017   SOB (shortness of breath) 04/17/2017   Hypothyroidism due to Hashimoto's thyroiditis 08/25/2015   Cerebrovascular disease 07/21/2015   Hyperlipidemia 07/21/2015   Tobacco abuse 07/21/2015   Memory deficits 06/19/2015   Right wrist fracture 10/17/2014   Dyslipidemia 10/12/2014   Constipation 10/12/2014   Fracture of femoral neck, right ([place]) 10/12/2014   Fall    Hip fracture ([place]) 10/03/2014   COPD (chronic obstructive pulmonary disease) ([place]) 10/03/2014   CVA (cerebral infarction) 11/13/2013   Hypokalemia 11/13/2013   h/o CVA 11/13/2013   Malignant neoplasm of female breast ([place]) 05/03/2010   SMOKER 05/03/2010   ARTHRITIS 05/03/2010   ALLERGIC RHINITIS 09/07/2009   COLONIC POLYPS, HYPERPLASTIC 07/12/2009   IBS 10/28/2008   Hypothyroidism 07/07/2008   ANXIETY 03/15/2008   ALCOHOL ABUSE 03/15/2008   DEPRESSION 03/15/2008   OSTEOPENIA 02/11/2008   Essential hypertension 02/02/2008   ANEMIA, IRON DEFICIENCY NOS 10/08/2007   GERD 08/21/2007   Past Medical History:  Diagnosis Date   Aneurysm of aorta ([place]) 12/2019   Anxiety    Arthritis    Bleeding diathesis ([place])    Hx of    Breast cancer (HCC)    Adenocarcinoma, radiation and surgery; 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   COPD (chronic obstructive pulmonary disease) (HCC)    heavy smoker 1ppd   CVA (cerebral vascular accident) ([place]) 11/2013   no deficits   DECREASED HEARING, LEFT EAR 05/03/2010   Qualifier: Diagnosis of  By: Asa Lente MD, Jannifer Rodney    Depression    Derangement of anterior horn of lateral meniscus 04/24/2009    Annotation: Underwent right knee arthroscopic surgery 09/20/09: For  chondromalacia and anterolateral meniscectomy Qualifier: Diagnosis of  By: Amil Amen MD, Benjamine Mola     GERD (gastroesophageal reflux disease)    Hyperlipidemia    Hyperlipidemia    Hypertension    Hypothyroidism    Pneumonia    Polio    Dx at age 89 yrs   Smoker    1ppd   Stroke [facility]) 11/2013   Thoracic aortic aneurysm (TAA) (HCC)    6.6 cm posterior arotic arch TAA 11/09/19     Allergies as of 12/14/2019      Reactions   Statins Other (See Comments)   Myalgias and fatigue   Codeine Other (See Comments)   REACTION: causes skin to feel like it's crawling   Levofloxacin Nausea Only      Medication List    STOP taking these medications   traMADol 50 MG  tablet Commonly known as: ULTRAM     TAKE these medications   acetaminophen 325 MG tablet Commonly known as: Tylenol Take 2 tablets (650 mg total) by mouth every 6 (six) hours. What changed:   when to take this  reasons to take this   amLODipine 10 MG tablet Commonly known as: NORVASC Take 10 mg by mouth daily.   aspirin EC 81 MG tablet Take 81 mg by mouth at bedtime.   carvedilol 6.25 MG tablet Commonly known as: COREG Take 12.5 mg by mouth 2 (two) times daily.   escitalopram 5 MG tablet Commonly known as: LEXAPRO Take 5 mg by mouth at bedtime.   ezetimibe 10 MG tablet Commonly known as: ZETIA Take 10 mg by mouth at bedtime.   fluticasone 50 MCG/ACT nasal spray Commonly known as: FLONASE Place 1 spray into the nose daily as needed for allergies or rhinitis (seasonal allergies).   hydrOXYzine 10 MG tablet Commonly known as: ATARAX/VISTARIL Take 10 mg by mouth 3 (three) times daily.   ibuprofen 200 MG tablet Commonly known as: Advil Take 3 tablets (600 mg total) by mouth every 6 (six) hours.   levothyroxine 75 MCG tablet Commonly known as: SYNTHROID Take 75 mcg by mouth daily before breakfast.   oxyCODONE-acetaminophen  5-325 MG tablet Commonly known as: PERCOCET/ROXICET Take 1 tablet by mouth every 6 (six) hours as needed for moderate pain.   sulfamethoxazole-trimethoprim 800-160 MG tablet Commonly known as: BACTRIM DS Take 1 tablet by mouth 2 (two) times daily.   vitamin C 250 MG tablet Commonly known as: ASCORBIC ACID Take 2 tablets (500 mg total) by mouth daily. What changed: Another medication with the same name was removed. Continue taking this medication, and follow the directions you see here.       Discharge Instructions: The patient is instructed to shower and pat dry incisions.  She is instructed to call our office for drainage from her incision, extremity weakness or numbness, abdominal or back pain, fever or chills.  [facility]  Discharge Instructions Endovascular Aortic Aneurysm Repair  Please refer to the following instructions for your post-procedure care. Your surgeon or Physician Assistant will discuss any changes with you.  Activity  You are encouraged to walk as much as you can. You can slowly return to normal activities but must avoid strenuous activity and heavy lifting until your doctor tells you it's OK. Avoid activities such as vacuuming or swinging a gold club. It is normal to feel tired for several weeks after your surgery. Do not drive until your doctor gives the OK and you are no longer taking prescription pain medications. It is also normal to have difficulty with sleep habits, eating, and bowel movements after surgery. These will go away with time.  Bathing/Showering  You may shower after you go home. If you have an incision, do not soak in a bathtub, hot tub, or swim until the incision heals completely.  Incision Care  Shower every day. Clean your incision with mild soap and water. Pat the area dry with a clean towel. You do not need a bandage unless otherwise instructed. Do not apply any ointments or creams to your incision. If you  clothing is irritating, you may cover your incision with a dry gauze pad.  Diet  Resume your normal diet. There are no special food restrictions following this procedure. A low fat/low cholesterol diet is recommended for all patients with vascular disease. In order to heal from your  surgery, it is CRITICAL to get adequate nutrition. Your body requires vitamins, minerals, and protein. Vegetables are the best source of vitamins and minerals. Vegetables also provide the perfect balance of protein. Processed food has little nutritional value, so try to avoid this.  Medications  Resume taking all of your medications unless your doctor or Physician Assistnat tells you not to. If your incision is causing pain, you may take over-the-counter pain relievers such as acetaminophen (Tylenol). If you were prescribed a stronger pain medication, please be aware these medications can cause nausea and constipation. Prevent nausea by taking the medication with a snack or meal. Avoid constipation by drinking plenty of fluids and eating foods with a high amount of fiber, such as fruits, vegetables, and grains. Do not take Tylenol if you are taking prescription pain medications.   Follow up  [place] office will schedule a follow-up appointment with a C.T. scan 3-4 weeks after your surgery.  Please call us immediately for any of the following conditions   Severe or worsening pain in your legs or feet or in your abdomen back or chest.  Increased pain, redness, drainage (pus) from your incision sit.  Increased abdominal pain, bloating, nausea, vomiting or persistent diarrhea.  Fever of 101 degrees or higher.  Swelling in your leg (s),   Reduce your risk of vascular disease  Stop smoking. If you would like help call [facility] at 1-800-QUIT-NOW 507-277-8428) or [place] at [phone]. Manage your cholesterol Maintain a desired weight Control your diabetes Keep your blood pressure down  If you  have questions, please call the office at [phone].    Prescriptions given: Percocet #10 No Refill  Disposition: Home with home O2  Patient's condition: is Good  Follow up: 1. Dr. Oneida Alar in 4 weeks with CTA protocol   Barbie Banner, PA-C [facility] [phone] 12/21/2019  9:53 AM   - For VQI Registry use - Post-op:  Time to Extubation: [x]  In OR, [ ]  < 12 hrs, [ ]  12-24 hrs, [ ]  >=24 hrs Vasopressors Req. Post-op: No MI: No., [ ]  Troponin only, [ ]  EKG or Clinical New Arrhythmia: No CHF: No ICU Stay: 0 day in Transfusion: No     If yes, 0 units given  Complications: Resp failure: No., [ ]  Pneumonia, [ ]  Ventilator Chg in renal function: No., [ ]  Inc. Cr > 0.5, [ ]  Temp. Dialysis,  [ ]  Permanent dialysis Leg ischemia: No., no Surgery needed, [ ]  Yes, Surgery needed,  [ ]  Amputation Bowel ischemia: No., [ ]  Medical Rx, [ ]  Surgical Rx Wound complication: No., [ ]  Superficial separation/infection, [ ]  Return to OR Return to OR: No  Return to OR for bleeding: No Stroke: No., [ ]  Minor, [ ]  Major  Discharge medications: Statin use:  Yes (Zetia)  ASA use:  Yes  Plavix use:  No  Beta blocker use:  Yes  ARB use:  No ACEI use:  No CCB use:  Yes

## 2019-12-21 NOTE — Telephone Encounter (Signed)
Spoke to pt's daughter Blenda Peals) who called for pt c/o pt pain around right hip. Pt is not wanting to take pain meds/Advil because it makes her feel sick. She had been taking it on an empty stomach. Explained to her it must be taken with food, preferably a meal. No redness, warmth or swelling. Pt's daughter wondered if pain could be coming from constipation. Encouraged her to have pt ambulate frequently, increase fluids and fiber. We discussed possible foods to encourage. No further questions at this time. She will call us back if there is no improvement or anything changes.

## 2019-12-24 ENCOUNTER — Other Ambulatory Visit: Payer: Self-pay

## 2019-12-24 NOTE — Progress Notes (Signed)
ORDERS ENTERED IN ERROR

## 2019-12-27 ENCOUNTER — Other Ambulatory Visit: Payer: Self-pay

## 2019-12-27 DIAGNOSIS — I712 Thoracic aortic aneurysm, without rupture, unspecified: Secondary | ICD-10-CM

## 2020-01-07 ENCOUNTER — Other Ambulatory Visit: Payer: Self-pay

## 2020-01-07 DIAGNOSIS — I712 Thoracic aortic aneurysm, without rupture, unspecified: Secondary | ICD-10-CM

## 2020-01-10 ENCOUNTER — Other Ambulatory Visit: Payer: Self-pay

## 2020-01-10 ENCOUNTER — Ambulatory Visit
Admission: RE | Admit: 2020-01-10 | Discharge: 2020-01-10 | Disposition: A | Discharge status: Home / Self Care | Payer: Medicare Other | Source: Ambulatory Visit | Attending: Vascular Surgery | Admitting: Vascular Surgery

## 2020-01-10 DIAGNOSIS — I712 Thoracic aortic aneurysm, without rupture, unspecified: Secondary | ICD-10-CM

## 2020-01-10 MED ORDER — IOPAMIDOL (ISOVUE-370) INJECTION 76%
75.0000 mL | Freq: Once | INTRAVENOUS | Status: AC | PRN
  Administered 2020-01-10: 10:00:00 75 mL via INTRAVENOUS

## 2020-01-13 ENCOUNTER — Encounter: Payer: Self-pay | Admitting: Vascular Surgery

## 2020-01-13 ENCOUNTER — Other Ambulatory Visit: Payer: Self-pay

## 2020-01-13 ENCOUNTER — Ambulatory Visit (INDEPENDENT_AMBULATORY_CARE_PROVIDER_SITE_OTHER): Payer: Self-pay | Admitting: Vascular Surgery

## 2020-01-13 VITALS — BP 143/84 | HR 79 | Temp 97.9°F | Resp 20 | Ht 64.0 in | Wt 150.0 lb

## 2020-01-13 DIAGNOSIS — I712 Thoracic aortic aneurysm, without rupture, unspecified: Secondary | ICD-10-CM

## 2020-01-13 NOTE — Progress Notes (Signed)
Patient is a 74 year old female who returns for follow-up today.  She recently underwent left carotid subclavian bypass thoracic aortic stenting coil embolization of the left subclavian artery and placement of a conduit for access for her thoracic aortic stent through a right retroperitoneal incision December 13, 2019.  She is recovering from all of this.  She had a few days where she was fairly sore after the operation but this seems to be improving.  Her appetite has improved.  She still feels weak and does not really have a lot of motivation for exercise but this all again is all slowly improving.  She has no back or chest pain.  Physical exam:  Vitals:   01/13/20 1521  BP: (!) 143/84  Pulse: 79  Resp: 20  Temp: 97.9 F (36.6 C)  SpO2: 94%  Weight: 150 lb (68 kg)  Height: 5\' 4"  (1.626 m)    Abdomen and left supraclavicular incisions healing well no drainage  Extremities: 2+ femoral pulses bilaterally  Data: I reviewed the patient's recent CT angiogram which shows the stent graft in good position.  The left subclavian artery is excluded.  The left carotid subclavian bypass is open.  There is a small residual stump from the conduit otherwise all of the structures look fairly normal.  I reviewed all of these images.  Assessment: Status post thoracic aortic stent graft with left carotid subclavian bypass.  She is doing well.  Plan: Follow-up CT angio chest abdomen pelvis in 6 months.  Ruta Hinds, MD [facility] [place] Office: [phone]

## 2020-02-02 ENCOUNTER — Encounter: Payer: Self-pay | Admitting: Cardiology

## 2020-02-02 DIAGNOSIS — Z7189 Other specified counseling: Secondary | ICD-10-CM | POA: Insufficient documentation

## 2020-02-02 NOTE — Progress Notes (Signed)
Cardiology Office Note   Date:  02/03/2020   ID:  Michaela Guerra, Michaela Guerra 25-Jan-1946, MRN [id number]  PCP:  Bernerd Limbo, MD  Cardiologist:   Minus Breeding, MD   Chief Complaint  Patient presents with  . Fatigue      History of Present Illness: Michaela Guerra is a 74 y.o. female who presents for evaluation of chest ian.  He had minimal plaque on cardiac catheterization in 2007.  He was last seen by Dr. Percival Spanish on 04/17/2017 at which time she had burning sensation in the chest.  Myoview was recommended and was done on 04/24/2017 which showed EF 61%, no evidence of prior infarct or ischemia.   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    The patient had placement of 37 x 37 x 15 cm Gore thoracic aneurysm stent graft with coverage of the left subclavian artery.  She had placement of 28 x 28 x 10 cm Gore thoracic aneurysm stent graft without coverage of the left subclavian artery.  She had coil embolization.    She is done relatively well actually.  She thinks she feels a little more energy procedure.  Is not noted weight.  She has not had pain.  She is very sedentary and does not seem to do quite a bit.  She still having trouble with her cigarettes and is smoking a pack a day.  She denies any chest pressure, neck or arm discomfort.  Is not having any new palpitations, presyncope or syncope.  Is not describing PND or orthopnea.  He has a little tenderness over the abdominal incision which is well-healed.  She has not had any cough fevers or chills besides her baseline smoker's cough.   Past Medical History:  Diagnosis Date  . Aneurysm of aorta ([place]) 12/2019  . Anxiety   . Arthritis   . Bleeding diathesis ([place])    Hx of   . Breast cancer ([place])    Adenocarcinoma, radiation and surgery; at age 24  . Closed fracture of left distal radius   . COLONIC POLYPS, HYPERPLASTIC 07/12/2009   Qualifier: Diagnosis of  By: Michaela Amen MD, Michaela Guerra    .  Complication of anesthesia    Hard to wake up.  Marland Kitchen COPD (chronic obstructive pulmonary disease) (HCC)    heavy smoker 1ppd  . CVA (cerebral vascular accident) ([place]) 11/2013   no deficits  . DECREASED HEARING, LEFT EAR 05/03/2010   Qualifier: Diagnosis of  By: Asa Lente MD, Michaela Guerra   . Depression   . Derangement of anterior horn of lateral meniscus 04/24/2009   Annotation: Underwent right knee arthroscopic surgery 09/20/09: For  chondromalacia and anterolateral meniscectomy Qualifier: Diagnosis of  By: Michaela Amen MD, Michaela Guerra    . GERD (gastroesophageal reflux disease)   . Hyperlipidemia   . Hypertension   . Hypothyroidism   . Polio    Dx at age 33 yrs  . Smoker    1ppd  . Thoracic aortic aneurysm (TAA) (HCC)    6.6 cm posterior arotic arch TAA 11/09/19    Past Surgical History:  Procedure Laterality Date  . BREAST SURGERY Left    lumpectomy  . CAROTID-SUBCLAVIAN BYPASS GRAFT Left 12/06/2019   Procedure: BYPASS GRAFT CAROTID-SUBCLAVIAN LEFT;  Surgeon: Michaela Dutch, MD;  [facility];  Service: Vascular;  Laterality: Left;  . EMBOLECTOMY Right 12/13/2019   Procedure: EMBOLECTOMY RIGHT SUBCLAVIAN;  Surgeon: Michaela Dutch, MD;  Location: [place];  Service: Vascular;  Laterality: Right;  . EYE SURGERY     Bilateral cataracts  . OPEN REDUCTION INTERNAL FIXATION (ORIF) DISTAL RADIAL FRACTURE Left 10/18/2019   Procedure: OPEN REDUCTION INTERNAL FIXATION (ORIF) DISTAL RADIAL FRACTURE;  Surgeon: Michaela Jakob, MD;  Location: [place];  Service: Orthopedics;  Laterality: Left;  SURGERY REQUEST TIME: 75 MIN  REGIONAL BLOCK  . THORACIC AORTIC ENDOVASCULAR STENT GRAFT  12/13/2019  . THORACIC AORTIC ENDOVASCULAR STENT GRAFT N/A 12/13/2019   Procedure: THORACIC AORTIC ENDOVASCULAR STENT GRAFT WITH RIGHT ABDOMEN CONDUIT;  Surgeon: Michaela Dutch, MD;  Location: [place];  Service: Vascular;  Laterality: N/A;  . TOTAL HIP ARTHROPLASTY Right 10/04/2014   Procedure: TOTAL HIP  ARTHROPLASTY ANTERIOR APPROACH;  Surgeon: Michaela Rossetti, MD;  Location: [place];  Service: Orthopedics;  Laterality: Right;     Current Outpatient Medications  Medication Sig Dispense Refill  . acetaminophen (TYLENOL) 325 MG tablet Take 2 tablets (650 mg total) by mouth every 6 (six) hours. (Patient taking differently: Take 650 mg by mouth every 6 (six) hours as needed for mild pain. )    . amLODipine (NORVASC) 10 MG tablet Take 10 mg by mouth daily.     Marland Kitchen aspirin EC 81 MG tablet Take 81 mg by mouth at bedtime.    . carvedilol (COREG) 6.25 MG tablet Take 12.5 mg by mouth 2 (two) times daily.     Marland Kitchen escitalopram (LEXAPRO) 5 MG tablet Take 5 mg by mouth at bedtime.     Marland Kitchen ezetimibe (ZETIA) 10 MG tablet Take 10 mg by mouth at bedtime.     Marland Kitchen ibuprofen (ADVIL) 200 MG tablet Take 3 tablets (600 mg total) by mouth every 6 (six) hours. 30 tablet 0  . levothyroxine (SYNTHROID) 25 MCG tablet Take 25 mcg by mouth daily.    . vitamin C (ASCORBIC ACID) 250 MG tablet Take 2 tablets (500 mg total) by mouth daily. 120 tablet 1   No current facility-administered medications for this visit.    Allergies:   Statins, Codeine, and Levofloxacin    ROS:  Please see the history of present illness.   Otherwise, review of systems are positive for none.   All other systems are reviewed and negative.    PHYSICAL EXAM: VS:  BP (!) 152/88   Pulse 81   Temp (!) 97.2 F (36.2 C)   Ht 5\' 5"  (1.651 m)   Wt 145 lb 3.2 oz (65.9 kg)   SpO2 93%   BMI 24.16 kg/m  , BMI Body mass index is 24.16 kg/m. GENERAL:  Well appearing NECK:  No jugular venous distention, waveform within normal limits, carotid upstroke brisk and symmetric, no bruits, no thyromegaly LUNGS:  Clear to auscultation bilaterally CHEST:  Unremarkable HEART:  PMI not displaced or sustained,S1 and S2 within normal limits, no S3, no S4, no clicks, no rubs, no murmurs ABD:  Flat, positive bowel sounds normal in frequency in pitch, no bruits, no  rebound, no guarding, no midline pulsatile mass, no hepatomegaly, no splenomegaly, well-healed right lower quadrant abdominal scar with mild tenderness to palpation EXT:  2 plus pulses throughout, no edema, no cyanosis no clubbing    EKG:  EKG is not ordered today.   Recent Labs: 12/13/2019: ALT 11; BUN 7; Creatinine, Ser 0.73; Magnesium 1.8; Potassium 4.2; Sodium 134 12/14/2019: Hemoglobin 10.5; Platelets 214    Lipid Panel    Component Value Date/Time   CHOL 180 06/30/2015 0833   TRIG 100 06/30/2015  [id number]   HDL 46 06/30/2015 0833   CHOLHDL 3.9 06/30/2015 0833   VLDL 20 06/30/2015 0833   LDLCALC 114 (H) 06/30/2015 0833      Wt Readings from Last 3 Encounters:  02/03/20 145 lb 3.2 oz (65.9 kg)  01/13/20 150 lb (68 kg)  12/13/19 150 lb (68 kg)      Other studies Reviewed: Additional studies/ records that were reviewed today include: [facility] notes.. Review of the above records demonstrates:  Please see elsewhere in the note.     ASSESSMENT AND PLAN:  THORACIC AORTIC ANEURYSM:   She did well with his surgery.  No change in therapy.  We will be aggressive with risk reduction.  HTN: Her blood pressure is not at target.  We gave her a blood pressure cuff today with instructions on how to use it.  She can keep a 2-week diary was reviewed when she returns for her appointment as below.  DYSLIPIDEMIA: Her last LDL was 125.  I again asked her to come back and see Dr. Debara Pickett as I think she should qualify for PCSK9 inhibitor.  I think she has been truly intolerant to statins in the past.  She is on Zetia.  I will repeat a lipid profile when she comes back.  However, I will start the consultation in anticipation of starting this medication and asked him to review her blood pressure diary at the same time.  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  She has depression and so going to try Chantix.  We continue to encourage tapering.  COVID EDUCATION: She had reservations about the  vaccine and I answered her questions.  She will [facility] to get [facility] at [facility].   Current medicines are reviewed at length with the patient today.  The patient does not have concerns regarding medicines.  The following changes have been made:  no change  Labs/ tests ordered today include: None  Orders Placed This Encounter  Procedures  . Lipid panel     Disposition:   FU with me in six months.     Signed, Minus Breeding, MD  02/03/2020 3:35 PM    [facility]

## 2020-02-03 ENCOUNTER — Ambulatory Visit (INDEPENDENT_AMBULATORY_CARE_PROVIDER_SITE_OTHER): Payer: Medicare Other | Admitting: Cardiology

## 2020-02-03 ENCOUNTER — Encounter: Payer: Self-pay | Admitting: Cardiology

## 2020-02-03 ENCOUNTER — Other Ambulatory Visit: Payer: Self-pay

## 2020-02-03 VITALS — BP 152/88 | HR 81 | Temp 97.2°F | Ht 65.0 in | Wt 145.2 lb

## 2020-02-03 DIAGNOSIS — Z7189 Other specified counseling: Secondary | ICD-10-CM

## 2020-02-03 DIAGNOSIS — I712 Thoracic aortic aneurysm, without rupture, unspecified: Secondary | ICD-10-CM

## 2020-02-03 DIAGNOSIS — I10 Essential (primary) hypertension: Secondary | ICD-10-CM | POA: Diagnosis not present

## 2020-02-03 DIAGNOSIS — Z720 Tobacco use: Secondary | ICD-10-CM | POA: Diagnosis not present

## 2020-02-03 DIAGNOSIS — E785 Hyperlipidemia, unspecified: Secondary | ICD-10-CM

## 2020-02-03 NOTE — Patient Instructions (Addendum)
Medication Instructions:  No changes *If you need a refill on your cardiac medications before your next appointment, please call your pharmacy*  Lab Work: Your physician recommends that you return for lab work at your next appointment (LIPIDS)  Testing/Procedures: None  Follow-Up: At [facility], you and your health needs are our priority.  As part of our continuing mission to provide you with exceptional heart care, we have created designated Provider Care Teams.  These Care Teams include your primary Cardiologist (physician) and Advanced Practice Providers (APPs -  Physician Assistants and Nurse Practitioners) who all work together to provide you with the care you need, when you need it.  Your next appointment:   2 week(s)  The format for your next appointment:   In Person  Provider:   Mali Hilty, MD for Repatha and check BP diary  Other Instructions Check you blood pressure twice a day and bring back to your appointment with Dr. Debara Pickett

## 2020-02-04 IMAGING — CT CT CTA ABD/PEL W/CM AND/OR W/O CM
1 of 5 series · 9 of 32 positions shown, 13 images · IV contrast (APPLIED)
Comparison: 11/09/2019, 10/11/2019

CLINICAL DATA: 73-year-old female with a history of thoracic
aneurysm, treated with TEVAR and left subclavian bypass

EXAM:
CT ANGIOGRAPHY CHEST, ABDOMEN AND PELVIS
TECHNIQUE: Multidetector CT imaging through the chest, abdomen and pelvis was
performed using the standard protocol during bolus administration of
intravenous contrast. Multiplanar reconstructed images and MIPs were
obtained and reviewed to evaluate the vascular anatomy.
CONTRAST:  75mL M1V1MP-YGR IOPAMIDOL (M1V1MP-YGR) INJECTION 76%

[Series 6: angio · axial · 0.72mm/px · z∈[-536,-30]mm · 9 of 301 slices shown, 13 images]
[im 24/301  soft-tissue]
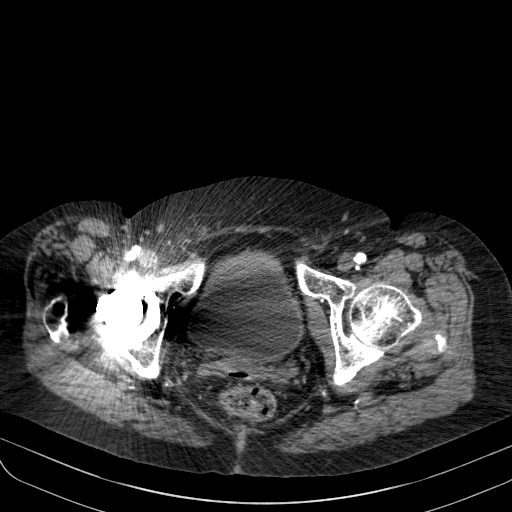
[im 24/301  bone]
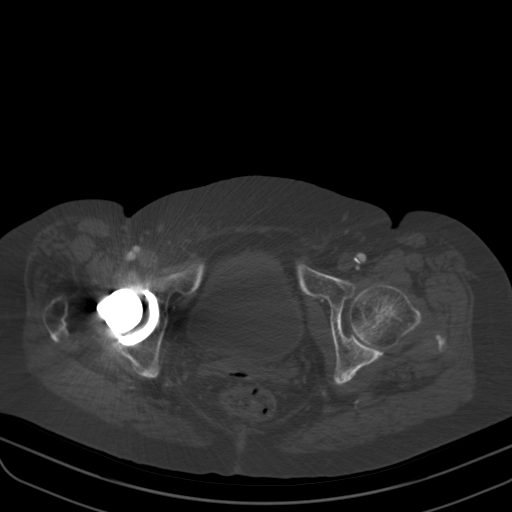
[im 70/301  soft-tissue]
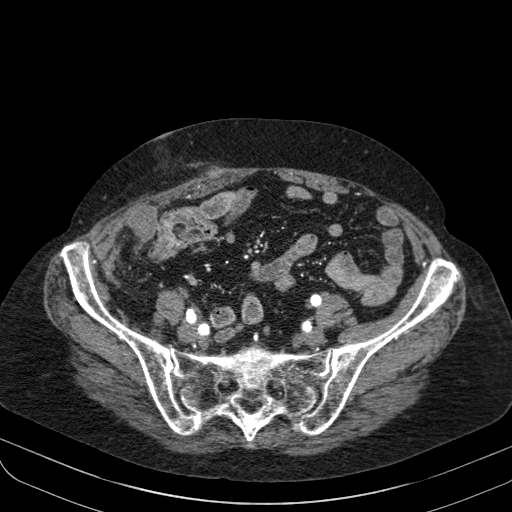
[im 93/301  soft-tissue]
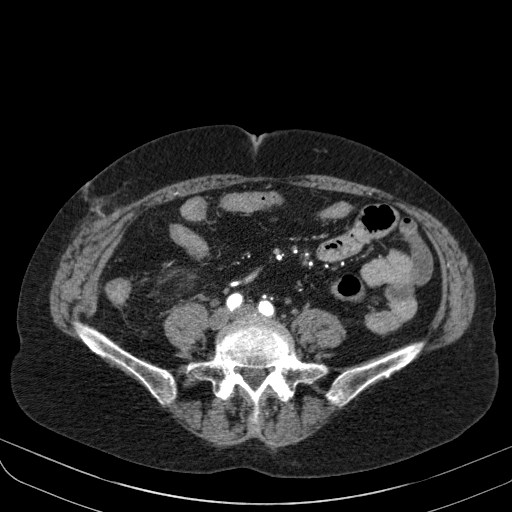
[im 139/301  soft-tissue]
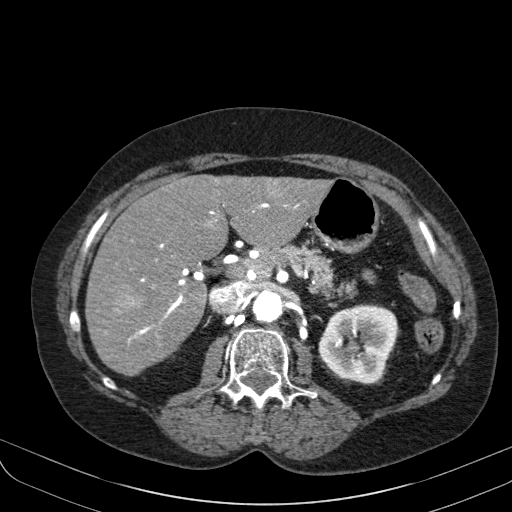
[im 162/301  soft-tissue]
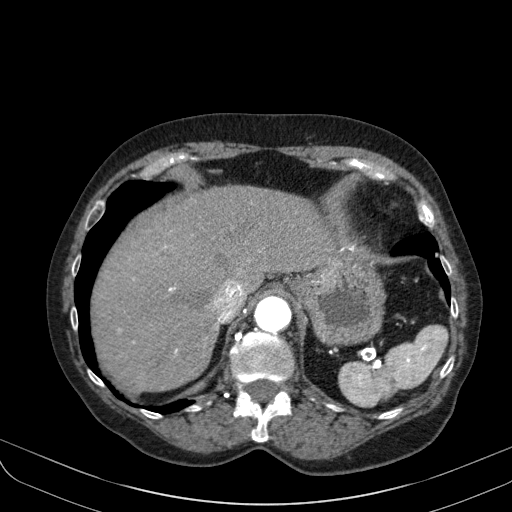
[im 208/301  soft-tissue]
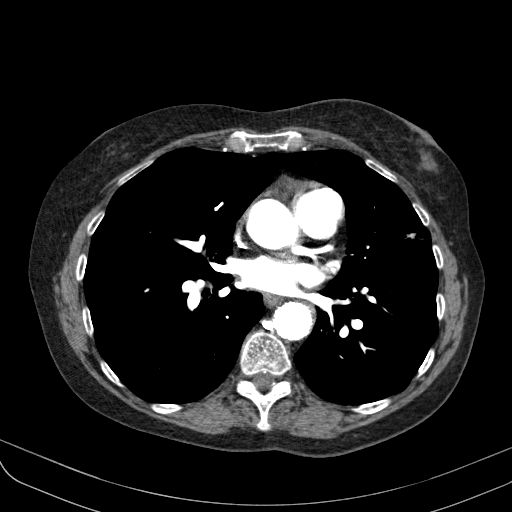
[im 208/301  lung]
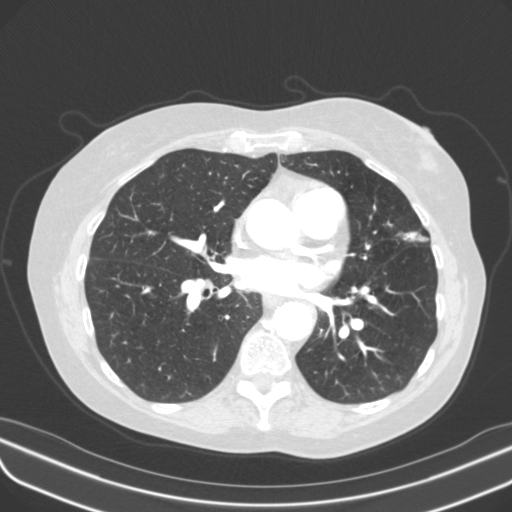
[im 231/301  soft-tissue]
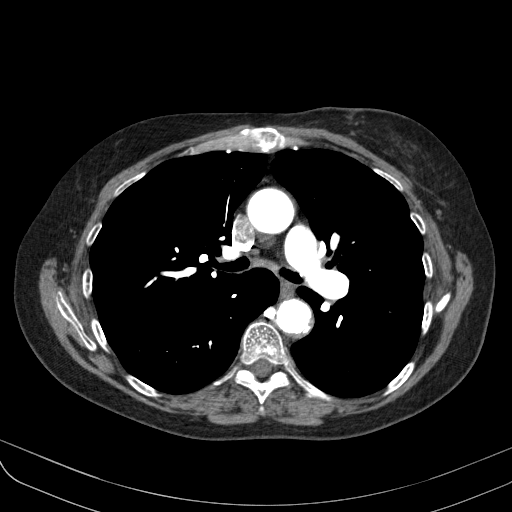
[im 231/301  lung]
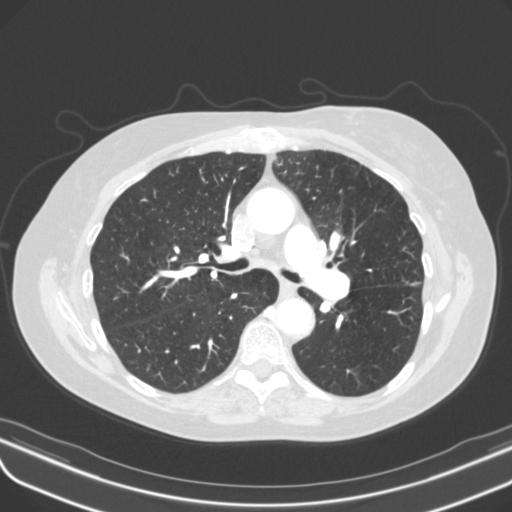
[im 254/301  lung]
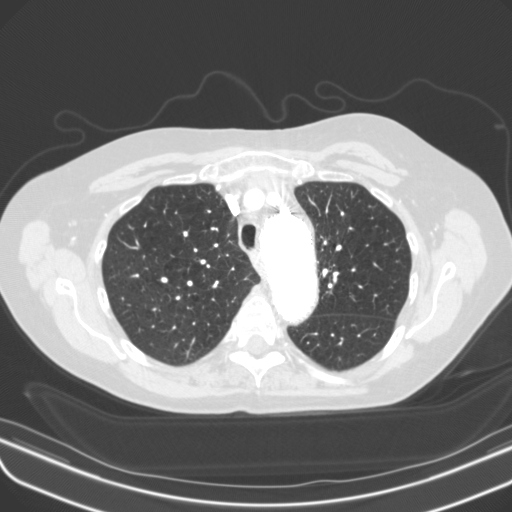
[im 277/301  soft-tissue]
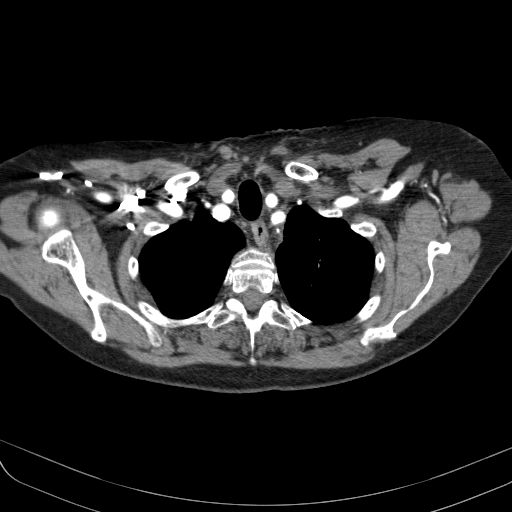
[im 277/301  lung]
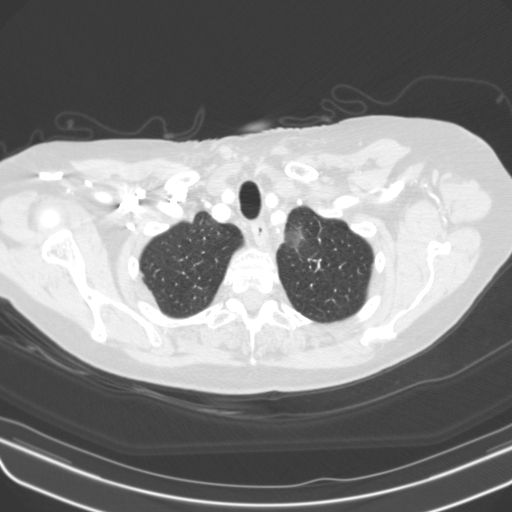

[9 of 32 positions shown; findings below may reference images not displayed]

FINDINGS: CTA CHEST FINDINGS

Cardiovascular:

Heart:

Heart size unchanged. Trace pericardial fluid/thickening, not
significantly changed from the comparison CT. Calcifications of left
main, left anterior descending coronary arteries.

Aorta:

No significant aortic valve calcifications. Diameter of the
ascending aorta 3.0 cm. No periaortic fluid or inflammatory changes.

Atherosclerotic changes of the thoracic arch.

Interval endovascular repair of the aortic aneurysm with stent
graft. The proximal margin of the stent graft terminates at the left
common carotid artery. Adequate apposition of the proximal stent
graft on the inner and outer curvature of the aorta, with no bird
beak configuration. The aneurysm sac appears excluded with no
evidence of type 1 or type 2 endoleak.

The parasagittal reformatted images demonstrate the greatest
diameter from the apex of the excluded aneurysm sac to the inner
curvature of the aorta measures 5.2 cm, previously 6.6 cm. No
inflammatory changes.

The innominate artery an the left common carotid artery remain
patent.

Unremarkable postsurgical appearance of a left carotid subclavian
bypass. Retrograde perfusion of the proximal subclavian artery to
the vertebral artery, thyrocervical trunk, costal cervical trunk,
internal mammary artery. Coil embolization of the proximal
subclavian artery.

A second stent graft present within the lower descending thoracic
aorta, with the proximal margin just beyond the proximal stent
graft, and the distal margin terminating at the T11 level. Good
apposition of the stent graft to the aorta. The 2 small separate
outpouching/irregular plaque of the distal descending thoracic aorta
appears no longer perfused.

Pulmonary arteries:

Bolus timing not optimized for the most sensitive evaluation of
pulmonary arteries. Unremarkable diameter of the main pulmonary
artery. No filling defects identified proximally.

Mediastinum/Nodes: No mediastinal adenopathy. Unremarkable
appearance of the thoracic esophagus.

Unremarkable thoracic inlet

Lungs/Pleura: Central airways are clear. No pleural effusion. No
confluent airspace disease.

Platelike scarring/atelectasis of the lingula, unchanged from the
comparison. Centrilobular emphysema.

Review of the MIP images confirms the above findings.

CTA ABDOMEN AND PELVIS FINDINGS

VASCULAR

Aorta: Atherosclerotic changes of the abdominal aorta. No dissection
or aneurysm. No periaortic fluid or inflammatory changes.

Celiac: Celiac artery patent with no significant atherosclerosis.
Rim calcified aneurysm of the mid segment of the splenic artery
measures 5 mm. This appears unchanged dating to the CT of
01/28/2011. Thrombosed aneurysm within the splenic hilum.

SMA: SMA patent with no significant atherosclerotic changes.
Replaced right hepatic artery.

Renals: Main right renal artery is patent with mild atherosclerosis.
There is accessory right renal artery originating caudal to the main
renal artery with mild atherosclerosis at the origin. This accessory
artery goes to the lower pole segment.

The main left renal artery is patent with mild atherosclerosis.
There is accessory left renal artery that appears to have high-grade
stenosis to the lower pole segment.

IMA: IMA is patent.

Right lower extremity:

Common iliac artery patent with mild atherosclerosis. No high-grade
stenosis or occlusion. Postsurgical changes of right iliac conduit
with residual perfusion of the conduit stump.

Hypogastric artery is patent.

External iliac artery with mild atherosclerosis.

Common femoral artery patent. Proximal profunda femoris and SFA
patent.

Left lower extremity:

Common iliac artery patent with mild to moderate atherosclerosis. No
high-grade stenosis or occlusion. Hypogastric artery is patent.

External iliac artery patent with mild atherosclerosis.

Common femoral artery with mild atherosclerosis.

Proximal profunda femoris and SFA patent.

Veins: Unremarkable appearance of the venous system.

Review of the MIP images confirms the above findings.

NON-VASCULAR

Hepatobiliary: Redemonstration of benign vascular lesion within
segment 6. Two additional small vascular lesions within the anterior
aspect of left liver (image 169 of series [DATE] series 6).

Unremarkable gallbladder

Pancreas: Unremarkable.

Spleen: Slightly irregular contour of the spleen parenchyma likely
related to prior infarction from thrombosed aneurysm in the hilum.

Adrenals/Urinary Tract: Unremarkable appearance of adrenal glands.

Right:

No hydronephrosis. Symmetric perfusion to the left. No
nephrolithiasis. Unremarkable course of the right ureter.

Left:

No hydronephrosis. Symmetric perfusion to the right. No
nephrolithiasis. Unremarkable course of the left ureter.

Unremarkable appearance of the urinary bladder .

Stomach/Bowel: Unremarkable appearance of the stomach. Unremarkable
appearance of small bowel. No evidence of obstruction. Normal
appendix. No significant colonic stool burden. Diverticula are
present with no focal inflammatory changes.

Lymphatic: No lymphadenopathy.

Mesenteric: Edema of the right pericolic gutter, with focal fluid
measuring 18 mm x 22 mm subjacent to the right abdominal wall
musculature at the site of the prior iliac conduit. No free fluid.

Reproductive: Unremarkable uterus/adnexa.

Other: Surgical changes of the right lower abdominal wall. No
hernia.

Musculoskeletal: No acute displaced fracture. Osteopenia.
Degenerative changes of the visualized thoracolumbar spine.
Hemangioma of L3. No significant bony canal narrowing. Surgical
changes of right hip arthroplasty.

.
IMPRESSION: Interval surgical changes of left carotid subclavian bypass, right
iliac conduit, and TEVAR for treatment of thoracic aortic aneurysmal
disease, with positive aortic remodeling at each of the 2 tandem
grafts and no evidence of endoleak or other complicating features.

Postsurgical changes of the right abdomen, including a 2 cm seroma
or partially liquified hematoma, at the site of prior surgical
conduit, as well as minimal perfusion at the conduit stump.

Small splenic arterial aneurysm, 5 mm with rim calcification,
unchanged from the CT dating to 6546. In addition there is a
thrombosed aneurysm at the splenic hilum.

Aortic atherosclerosis, coronary artery disease, and mild bilateral
iliac and proximal femoral arterial disease. Aortic Atherosclerosis
(O99DC-N78.8).

Emphysema (O99DC-SR1.F).

Additional ancillary findings as above.

## 2020-02-07 ENCOUNTER — Telehealth: Payer: Self-pay | Admitting: Cardiology

## 2020-02-07 NOTE — Telephone Encounter (Signed)
Patient states she has not been taking BP medication due to being sick and having 3 major surgeries in the past 2 months. Please call.

## 2020-02-07 NOTE — Telephone Encounter (Signed)
Pt called and stated that she has not been taking her BP medications as prescribed d/t having surgeries. Asked pt if she has been taking her BP's - pt denied. Stated that she was supposed to be keeping a BP log for Dr. Percival Spanish and she has not been. Asked pt if she was able to take her BP's at home - she said she had a machine. Asked pt if she could take her current BP - denied. Educated pt the importance of keeping up with her medication regimen and taking her BP daily. Pt verbalized understanding. Advised pt to start taking her prescribed medications and daily BP's - agreed.

## 2020-02-07 NOTE — Telephone Encounter (Signed)
Agree.  Needs to keep on her meds and keep a BP diary.

## 2020-02-15 ENCOUNTER — Ambulatory Visit: Payer: Medicare Other | Admitting: Internal Medicine

## 2020-04-25 ENCOUNTER — Telehealth: Payer: Self-pay | Admitting: Cardiology

## 2020-04-25 NOTE — Telephone Encounter (Signed)
Called patient 04/25/20 to schedule appointment, left message 

## 2020-06-19 ENCOUNTER — Telehealth: Payer: Self-pay | Admitting: Cardiology

## 2020-06-19 NOTE — Telephone Encounter (Signed)
LVM for patient to return call to get f/u appt scheduled from patients recall lists 

## 2020-06-26 ENCOUNTER — Other Ambulatory Visit: Payer: Self-pay

## 2020-06-26 DIAGNOSIS — I712 Thoracic aortic aneurysm, without rupture, unspecified: Secondary | ICD-10-CM

## 2020-07-19 ENCOUNTER — Ambulatory Visit
Admission: RE | Admit: 2020-07-19 | Discharge: 2020-07-19 | Disposition: A | Discharge status: Home / Self Care | Payer: Medicare Other | Source: Ambulatory Visit | Attending: Vascular Surgery | Admitting: Vascular Surgery

## 2020-07-19 ENCOUNTER — Other Ambulatory Visit: Payer: Self-pay

## 2020-07-19 DIAGNOSIS — I712 Thoracic aortic aneurysm, without rupture, unspecified: Secondary | ICD-10-CM

## 2020-07-19 MED ORDER — IOPAMIDOL (ISOVUE-370) INJECTION 76%
75.0000 mL | Freq: Once | INTRAVENOUS | Status: AC | PRN
  Administered 2020-07-19: 75 mL via INTRAVENOUS

## 2020-07-27 ENCOUNTER — Other Ambulatory Visit: Payer: Self-pay

## 2020-07-27 ENCOUNTER — Ambulatory Visit (INDEPENDENT_AMBULATORY_CARE_PROVIDER_SITE_OTHER): Payer: Medicare Other | Admitting: Vascular Surgery

## 2020-07-27 ENCOUNTER — Encounter: Payer: Self-pay | Admitting: Vascular Surgery

## 2020-07-27 ENCOUNTER — Encounter: Payer: Self-pay | Admitting: Genetic Counselor

## 2020-07-27 VITALS — BP 164/82 | HR 85 | Temp 98.0°F | Resp 20 | Ht 65.0 in | Wt 145.0 lb

## 2020-07-27 DIAGNOSIS — I712 Thoracic aortic aneurysm, without rupture, unspecified: Secondary | ICD-10-CM

## 2020-07-27 NOTE — Progress Notes (Signed)
Patient is a 74 year old female who returns for follow-up today.  She underwent left carotid subclavian bypass and thoracic aortic stenting with coil embolization of the left subclavian artery through a right iliac conduit for thoracic aneurysm December 13, 2019.  She states that since her operation she has had intermittent anxiety.  They have started medication for this.  She also states that occasionally she has a gasping sigh of shortness of breath.  She does not have any chest pain.  Her daughter states that she rarely leaves the house secondary to her anxiety.  Review of systems: She does not really describe continuous shortness of breath.  She does not really have shortness of breath with activity.  She has no chest pain.  Current Outpatient Medications on File Prior to Visit  Medication Sig Dispense Refill  . acetaminophen (TYLENOL) 325 MG tablet Take 2 tablets (650 mg total) by mouth every 6 (six) hours. (Patient taking differently: Take 650 mg by mouth every 6 (six) hours as needed for mild pain. )    . amLODipine (NORVASC) 10 MG tablet Take 10 mg by mouth daily.     Marland Kitchen aspirin EC 81 MG tablet Take 81 mg by mouth at bedtime.    . busPIRone (BUSPAR) 7.5 MG tablet     . carvedilol (COREG) 6.25 MG tablet Take 12.5 mg by mouth 2 (two) times daily.     Marland Kitchen escitalopram (LEXAPRO) 5 MG tablet Take 5 mg by mouth at bedtime.     Marland Kitchen ezetimibe (ZETIA) 10 MG tablet Take 10 mg by mouth at bedtime.     . fluticasone (FLONASE) 50 MCG/ACT nasal spray Place into both nostrils.    Marland Kitchen ibuprofen (ADVIL) 200 MG tablet Take 3 tablets (600 mg total) by mouth every 6 (six) hours. 30 tablet 0  . levothyroxine (SYNTHROID) 25 MCG tablet Take 25 mcg by mouth daily.    Marland Kitchen losartan (COZAAR) 50 MG tablet Take 50 mg by mouth daily.    . vitamin C (ASCORBIC ACID) 250 MG tablet Take 2 tablets (500 mg total) by mouth daily. 120 tablet 1  . [DISCONTINUED] omeprazole (PRILOSEC OTC) 20 MG tablet Take 1 tablet by mouth daily.     No  current facility-administered medications on file prior to visit.    Past Surgical History:  Procedure Laterality Date  . BREAST SURGERY Left    lumpectomy  . CAROTID-SUBCLAVIAN BYPASS GRAFT Left 12/06/2019   Procedure: BYPASS GRAFT CAROTID-SUBCLAVIAN LEFT;  Surgeon: Elam Dutch, MD;  Location: [facility] OR;  Service: Vascular;  Laterality: Left;  . EMBOLECTOMY Right 12/13/2019   Procedure: EMBOLECTOMY RIGHT SUBCLAVIAN;  Surgeon: Elam Dutch, MD;  Location: [place];  Service: Vascular;  Laterality: Right;  . EYE SURGERY     Bilateral cataracts  . OPEN REDUCTION INTERNAL FIXATION (ORIF) DISTAL RADIAL FRACTURE Left 10/18/2019   Procedure: OPEN REDUCTION INTERNAL FIXATION (ORIF) DISTAL RADIAL FRACTURE;  Surgeon: Milly Jakob, MD;  Location: [place];  Service: Orthopedics;  Laterality: Left;  SURGERY REQUEST TIME: 75 MIN  REGIONAL BLOCK  . THORACIC AORTIC ENDOVASCULAR STENT GRAFT  12/13/2019  . THORACIC AORTIC ENDOVASCULAR STENT GRAFT N/A 12/13/2019   Procedure: THORACIC AORTIC ENDOVASCULAR STENT GRAFT WITH RIGHT ABDOMEN CONDUIT;  Surgeon: Elam Dutch, MD;  Location: [place];  Service: Vascular;  Laterality: N/A;  . TOTAL HIP ARTHROPLASTY Right 10/04/2014   Procedure: TOTAL HIP ARTHROPLASTY ANTERIOR APPROACH;  Surgeon: Mcarthur Rossetti, MD;  Location: [place];  Service: Orthopedics;  Laterality: Right;    Past Medical History:  Diagnosis Date  . Allergy   . Aneurysm of aorta ([place]) 12/2019  . Anxiety   . Arthritis   . Bleeding diathesis ([place])    Hx of   . Breast cancer ([place])    Adenocarcinoma, radiation and surgery; at age 79  . Closed fracture of left distal radius   . COLONIC POLYPS, HYPERPLASTIC 07/12/2009   Qualifier: Diagnosis of  By: Amil Amen MD, Benjamine Mola    . Complication of anesthesia    Hard to wake up.  Marland Kitchen COPD (chronic obstructive pulmonary disease) (H[facility])    heavy smoker 1ppd  . CVA (cerebral vascular accident) ([place]) 11/2013   no deficits  .  DECREASED HEARING, LEFT EAR 05/03/2010   Qualifier: Diagnosis of  By: Asa Lente MD, Jannifer Rodney   . Depression   . Derangement of anterior horn of lateral meniscus 04/24/2009   Annotation: Underwent right knee arthroscopic surgery 09/20/09: For  chondromalacia and anterolateral meniscectomy Qualifier: Diagnosis of  By: Amil Amen MD, Benjamine Mola    . GERD (gastroesophageal reflux disease)   . Hyperlipidemia   . Hypertension   . Hypothyroidism   . Polio    Dx at age 29 yrs  . Smoker    1ppd  . Thoracic aortic aneurysm (TAA) (H[facility])    6.6 cm posterior arotic arch TAA 11/09/19   Physical exam:  Vitals:   07/27/20 1436  BP: (!) 164/82  Pulse: 85  Resp: 20  Temp: 98 F (36.7 C)  SpO2: 94%  Weight: 145 lb (65.8 kg)  Height: 5\' 5"  (1.651 m)   Extremities: 2+ radial pulses 2+ femoral pulses bilaterally  Abdomen: Soft nontender no pulsatile mass  Data: Patient had a CT scan of the chest abdomen and pelvis a few days ago.  This shows good exclusion of the aneurysm with a patent left carotid subclavian bypass graft and no complicating features in the abdomen from her prior iliac conduit.  Assessment: Doing well status post thoracic stent graft for thoracic aortic aneurysm.  Occasional intermittent sigh/shortness of breath with no evidence of diaphragmatic issues on the CT scan but certainly not a dynamic scan.  She does have follow-up scheduled with Dr. Percival Spanish in the near future and I told her to discuss the shortness of breath with him as well.  I also discussed with her again today smoking cessation.  She will think about it.  Plan: Patient will follow up with me in 1 year with CT scan of the chest abdomen pelvis.  Ruta Hinds, MD [facility] Office: [phone]

## 2020-08-08 ENCOUNTER — Other Ambulatory Visit: Payer: Self-pay

## 2020-08-08 ENCOUNTER — Encounter: Payer: Self-pay | Admitting: Internal Medicine

## 2020-08-08 ENCOUNTER — Ambulatory Visit (INDEPENDENT_AMBULATORY_CARE_PROVIDER_SITE_OTHER): Payer: Medicare Other | Admitting: Internal Medicine

## 2020-08-08 VITALS — BP 150/76 | HR 76 | Ht 65.0 in | Wt 157.9 lb

## 2020-08-08 DIAGNOSIS — I712 Thoracic aortic aneurysm, without rupture, unspecified: Secondary | ICD-10-CM

## 2020-08-08 DIAGNOSIS — M791 Myalgia, unspecified site: Secondary | ICD-10-CM | POA: Diagnosis not present

## 2020-08-08 DIAGNOSIS — I10 Essential (primary) hypertension: Secondary | ICD-10-CM

## 2020-08-08 DIAGNOSIS — E785 Hyperlipidemia, unspecified: Secondary | ICD-10-CM | POA: Diagnosis not present

## 2020-08-08 DIAGNOSIS — T466X5A Adverse effect of antihyperlipidemic and antiarteriosclerotic drugs, initial encounter: Secondary | ICD-10-CM

## 2020-08-08 NOTE — Patient Instructions (Addendum)
Medication Instructions:  Please start the Repatha Injection after the prior authorization.  Dr. Debara Pickett recommends Repatha 140 mg every 2 weeks (PCSK9). This is an injectable cholesterol medication self-administered once every 14 days. This medication will likely need prior approval with your insurance company, which we will work on. If the medication is not approved initially, we may need to do an appeal with your insurance. We will keep you updated on this process.   Administer medication in area of fatty tissue such as abdomen, outer thigh, back up of arm - and rotate site with each injection Store medication in refrigerator until ready to administer - allow to sit at room temp for 30 mins - 1 hour prior to injection Dispose of medication in a SHARPS container - your pharmacy should be able to direct you on this and proper disposal   If you need co-pay assistance grant, please look into the program at healthwellfoundation.org >> disease funds >> hypercholesterolemia. This is an online application or you can call to complete. Once approved, you will provide the "pharmacy card" information to your pharmacy and they will deduct the co-pays from this grant.  If you need a co-pay card for Repatha: [email or web address] >> paying for Repatha or red box that says "Repatha Copay Card" in top right If you need a co-pay card for Praluent: WedMap.it >> starting & paying for Praluent  *If you need a refill on your cardiac medications before your next appointment, please call your pharmacy*  Lab Work: Lipid Panel today- please go by lab to have this drawn Please return in 3 months for Repeat Fasting Lipid panel  If you have labs (blood work) drawn today and your tests are completely normal, you will receive your results only by: Marland Kitchen MyChart Message (if you have MyChart) OR . A paper copy in the mail If you have any lab test that is abnormal or we need to change your treatment, we will call you to review the  results.  Testing/Procedures: None Ordered At This Time.   Follow-Up: At [facility], you and your health needs are our priority.  As part of our continuing mission to provide you with exceptional heart care, we have created designated Provider Care Teams.  These Care Teams include your primary Cardiologist (physician) and Advanced Practice Providers (APPs -  Physician Assistants and Nurse Practitioners) who all work together to provide you with the care you need, when you need it.  We recommend signing up for the patient portal called "MyChart".  Sign up information is provided on this After Visit Summary.  MyChart is used to connect with patients for Virtual Visits (Telemedicine).  Patients are able to view lab/test results, encounter notes, upcoming appointments, etc.  Non-urgent messages can be sent to your provider as well.   To learn more about what you can do with MyChart, go to [email or web address].    Your next appointment:   3 month(s)  The format for your next appointment:   In Person  Provider:   K. Mali Hilty, MD

## 2020-08-08 NOTE — Progress Notes (Signed)
LIPID CLINIC CONSULT NOTE  Chief Complaint:  Manage dyslipidemia  Primary Care Physician: Bernerd Limbo, MD  Primary Cardiologist:  Minus Breeding, MD  HPI:  Michaela Guerra is a 74 y.o. female who is being seen today for the evaluation of dyslipidemia at the request of Dr. Percival Spanish.  This is a pleasant 74 year old female kindly referred by Dr. Percival Spanish after he saw her for preop visit in February 2021.  She was found to have a thoracic aortic aneurysm which was incidentally noted and treated by Dr. Oneida Alar.  Based on her PAD, her target LDL is less than 70.  Unfortunately she had significant myalgias on statins in the past and cannot tolerate them.  She has been on ezetimibe but her most recent LDL cholesterol was 125, as of May 2020.  She is unfortunately not had repeat blood work since then, partially due to the COVID-19 pandemic.  PMHx:  Past Medical History:  Diagnosis Date  . Allergy   . Aneurysm of aorta ([place]) 12/2019  . Anxiety   . Arthritis   . Bleeding diathesis ([place])    Hx of   . Breast cancer ([place])    Adenocarcinoma, radiation and surgery; at age 89  . Closed fracture of left distal radius   . COLONIC POLYPS, HYPERPLASTIC 07/12/2009   Qualifier: Diagnosis of  By: Amil Amen MD, Benjamine Mola    . Complication of anesthesia    Hard to wake up.  Marland Kitchen COPD (chronic obstructive pulmonary disease) (HCC)    heavy smoker 1ppd  . CVA (cerebral vascular accident) ([place]) 11/2013   no deficits  . DECREASED HEARING, LEFT EAR 05/03/2010   Qualifier: Diagnosis of  By: Asa Lente MD, Jannifer Rodney   . Depression   . Derangement of anterior horn of lateral meniscus 04/24/2009   Annotation: Underwent right knee arthroscopic surgery 09/20/09: For  chondromalacia and anterolateral meniscectomy Qualifier: Diagnosis of  By: Amil Amen MD, Benjamine Mola    . GERD (gastroesophageal reflux disease)   . Hyperlipidemia   . Hypertension   . Hypothyroidism   . Polio    Dx at age 21 yrs  . Smoker    1ppd  .  Thoracic aortic aneurysm (TAA) (HCC)    6.6 cm posterior arotic arch TAA 11/09/19    Past Surgical History:  Procedure Laterality Date  . BREAST SURGERY Left    lumpectomy  . CAROTID-SUBCLAVIAN BYPASS GRAFT Left 12/06/2019   Procedure: BYPASS GRAFT CAROTID-SUBCLAVIAN LEFT;  Surgeon: Elam Dutch, MD;  Location: [facility] OR;  Service: Vascular;  Laterality: Left;  . EMBOLECTOMY Right 12/13/2019   Procedure: EMBOLECTOMY RIGHT SUBCLAVIAN;  Surgeon: Elam Dutch, MD;  Location: [place];  Service: Vascular;  Laterality: Right;  . EYE SURGERY     Bilateral cataracts  . OPEN REDUCTION INTERNAL FIXATION (ORIF) DISTAL RADIAL FRACTURE Left 10/18/2019   Procedure: OPEN REDUCTION INTERNAL FIXATION (ORIF) DISTAL RADIAL FRACTURE;  Surgeon: Milly Jakob, MD;  Location: [place];  Service: Orthopedics;  Laterality: Left;  SURGERY REQUEST TIME: 75 MIN  REGIONAL BLOCK  . THORACIC AORTIC ENDOVASCULAR STENT GRAFT  12/13/2019  . THORACIC AORTIC ENDOVASCULAR STENT GRAFT N/A 12/13/2019   Procedure: THORACIC AORTIC ENDOVASCULAR STENT GRAFT WITH RIGHT ABDOMEN CONDUIT;  Surgeon: Elam Dutch, MD;  Location: [place];  Service: Vascular;  Laterality: N/A;  . TOTAL HIP ARTHROPLASTY Right 10/04/2014   Procedure: TOTAL HIP ARTHROPLASTY ANTERIOR APPROACH;  Surgeon: Mcarthur Rossetti, MD;  Location: [place];  Service: Orthopedics;  Laterality: Right;  FAMHx:  Family History  Problem Relation Age of Onset  . Breast cancer Mother   . Stroke Mother   . Stroke Father   . Breast cancer Sister   . Hypertension Sister   . Hypertension Sister   . Dementia Sister   . Hemophilia Grandson   . Hemophilia Daughter     SOCHx:   reports that she has been smoking. She has a 40.00 pack-year smoking history. She has never used smokeless tobacco. She reports current alcohol use. She reports that she does not use drugs.  ALLERGIES:  Allergies  Allergen Reactions  . Statins Other (See Comments)     Myalgias and fatigue  . Codeine Other (See Comments)    REACTION: causes skin to feel like it's crawling  . Levofloxacin Nausea Only    ROS: Pertinent items noted in HPI and remainder of comprehensive ROS otherwise negative.  HOME MEDS: Current Outpatient Medications on File Prior to Visit  Medication Sig Dispense Refill  . cetirizine (ZYRTEC) 10 MG tablet Take 10 mg by mouth daily.    Marland Kitchen aspirin EC 81 MG tablet Take 81 mg by mouth at bedtime.    . busPIRone (BUSPAR) 7.5 MG tablet     . carvedilol (COREG) 6.25 MG tablet Take 12.5 mg by mouth 2 (two) times daily.     Marland Kitchen escitalopram (LEXAPRO) 5 MG tablet Take 5 mg by mouth at bedtime.     Marland Kitchen ezetimibe (ZETIA) 10 MG tablet Take 10 mg by mouth at bedtime.     . fluticasone (FLONASE) 50 MCG/ACT nasal spray Place into both nostrils.    Marland Kitchen levothyroxine (SYNTHROID) 25 MCG tablet Take 25 mcg by mouth daily.    . [DISCONTINUED] omeprazole (PRILOSEC OTC) 20 MG tablet Take 1 tablet by mouth daily.     No current facility-administered medications on file prior to visit.    LABS/IMAGING: No results found for this or any previous visit (from the past 48 hour(s)). No results found.  LIPID PANEL:    Component Value Date/Time   CHOL 180 06/30/2015 0833   TRIG 100 06/30/2015 0833   HDL 46 06/30/2015 0833   CHOLHDL 3.9 06/30/2015 0833   VLDL 20 06/30/2015 0833   LDLCALC 114 (H) 06/30/2015 0833    WEIGHTS: Wt Readings from Last 3 Encounters:  08/08/20 157 lb 14.4 oz (71.6 kg)  07/27/20 145 lb (65.8 kg)  02/03/20 145 lb 3.2 oz (65.9 kg)    VITALS: BP (!) 150/76   Pulse 76   Ht 5\' 5"  (1.651 m)   Wt 157 lb 14.4 oz (71.6 kg)   SpO2 91%   BMI 26.28 kg/m   EXAM: Deferred  EKG:  Deferred  ASSESSMENT: 1. Mixed dyslipidemia, goal LDL less than 70 2. Thoracic aortic aneurysm-repaired 3. Hypertension 4. Tobacco abuse 5. Statin intolerant-myalgias  PLAN: 1.   Michaela Guerra has a mixed dyslipidemia with her thoracic aneurysm/PAD, her  target LDL is less than 70.  Unfortunate she has been a statin intolerant due to myalgias however is on ezetimibe.  Her last LDL was 125 however she is due for repeat labs.  We will obtain those today.  If she remains above target she is a good candidate to add a PCSK9 inhibitor.  I would recommend Repatha 140 mg every 2 weeks.  I demonstrated the autoinjector with her today.  This was also discussed with her daughter who was present.  She has a follow-up with Dr. Percival Spanish in a week.  We  will obtain prior authorization for the medication and advised her to apply for the health well grant due to the cost.  Thanks for the kind referral.  Follow-up with me in 3 months after repeat lipids.  Pixie Casino, MD, [facility], [place] Director of the Advanced Lipid Disorders &  Cardiovascular Risk Reduction Clinic Diplomate of the American Board of Clinical Lipidology Attending Cardiologist  Direct Dial: [phone]  Fax: [phone]  Website:  [email or web address]  Nadean Corwin Marthena Whitmyer 08/08/2020, 2:23 PM

## 2020-08-09 LAB — LIPID PANEL
Chol/HDL Ratio: 4.3 ratio (ref 0.0–4.4)
Cholesterol, Total: 190 mg/dL (ref 100–199)
HDL: 44 mg/dL (ref >39)
LDL Chol Calc (NIH): 114 mg/dL — ABNORMAL HIGH (ref 0–99)
Triglycerides: 183 mg/dL — ABNORMAL HIGH (ref 0–149)
VLDL Cholesterol Cal: 32 mg/dL (ref 5–40)

## 2020-08-10 ENCOUNTER — Telehealth: Payer: Self-pay | Admitting: Internal Medicine

## 2020-08-10 MED ORDER — REPATHA SURECLICK 140 MG/ML ~~LOC~~ SOAJ: 1.0000 | SUBCUTANEOUS | 11 refills | Status: DC

## 2020-08-10 NOTE — Telephone Encounter (Signed)
Michaela Guerra (Key: [id number]) Repatha SureClick 140MG /ML auto-injectors Form [employer] Medicare Part D Electronic Prior Authorization Form (2017 NCPDP) Created 21 hours ago Sent to Plan 21 hours ago Plan Response 21 hours ago Submit Clinical Questions 21 hours ago Determination Favorable 21 hours ago Message from Plan Request Reference Number: [id number]. REPATHA SURE INJ 140MG /ML is approved through 02/06/2021. Your patient may now fill this prescription and it will be covered

## 2020-08-16 NOTE — Progress Notes (Signed)
Cardiology Office Note   Date:  08/17/2020   ID:  Michaela Guerra, DOB 12-24-1945, MRN [id number]  PCP:  Bernerd Limbo, MD  Cardiologist:   Minus Breeding, MD   Chief Complaint  Patient presents with  . PVD      History of Present Illness: Michaela Guerra is a 74 y.o. female who presents for evaluation of chest pain.  He had minimal plaque on cardiac catheterization in 2007. In 2018 she had a had burning sensation in the chest.  Myoview was recommended and was done on 04/24/2017 which showed EF 61%, no evidence of prior infarct or ischemia.   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    The patient had placement of 37 x 37 x 15 cm Gore thoracic aneurysm stent graft with coverage of the left subclavian artery.  She had placement of 28 x 28 x 10 cm Gore thoracic aneurysm stent graft without coverage of the left subclavian artery.  She had coil embolization.    Since she was last seen she had follow-up CT by Dr. Oneida Alar.  This demonstrated the TEVAR was stable left carotid subclavian bypass and proximal left subclavian artery embolization without evidence of further remodeling or endoleak.  She has done okay.  She is bothered by some left hip pain and is going to see orthopedics.  She cannot walk as much because of this.  She denies any new cardiovascular symptoms such as chest pressure, neck or arm discomfort.  She has had no new shortness of breath, PND or orthopnea.  She had no weight gain or edema.   Past Medical History:  Diagnosis Date  . Allergy   . Aneurysm of aorta ([place]) 12/2019  . Anxiety   . Arthritis   . Bleeding diathesis ([place])    Hx of   . Breast cancer ([place])    Adenocarcinoma, radiation and surgery; at age 36  . Closed fracture of left distal radius   . COLONIC POLYPS, HYPERPLASTIC 07/12/2009   Qualifier: Diagnosis of  By: Amil Amen MD, Benjamine Mola    . Complication of anesthesia    Hard to wake up.  Marland Kitchen COPD (chronic obstructive  pulmonary disease) (HCC)    heavy smoker 1ppd  . CVA (cerebral vascular accident) ([place]) 11/2013   no deficits  . DECREASED HEARING, LEFT EAR 05/03/2010   Qualifier: Diagnosis of  By: Asa Lente MD, Jannifer Rodney   . Depression   . Derangement of anterior horn of lateral meniscus 04/24/2009   Annotation: Underwent right knee arthroscopic surgery 09/20/09: For  chondromalacia and anterolateral meniscectomy Qualifier: Diagnosis of  By: Amil Amen MD, Benjamine Mola    . GERD (gastroesophageal reflux disease)   . Hyperlipidemia   . Hypertension   . Hypothyroidism   . Polio    Dx at age 26 yrs  . Smoker    1ppd  . Thoracic aortic aneurysm (TAA) (HCC)    6.6 cm posterior arotic arch TAA 11/09/19    Past Surgical History:  Procedure Laterality Date  . BREAST SURGERY Left    lumpectomy  . CAROTID-SUBCLAVIAN BYPASS GRAFT Left 12/06/2019   Procedure: BYPASS GRAFT CAROTID-SUBCLAVIAN LEFT;  Surgeon: Elam Dutch, MD;  Location: [facility] OR;  Service: Vascular;  Laterality: Left;  . EMBOLECTOMY Right 12/13/2019   Procedure: EMBOLECTOMY RIGHT SUBCLAVIAN;  Surgeon: Elam Dutch, MD;  Location: [place];  Service: Vascular;  Laterality: Right;  . EYE SURGERY     Bilateral cataracts  .  OPEN REDUCTION INTERNAL FIXATION (ORIF) DISTAL RADIAL FRACTURE Left 10/18/2019   Procedure: OPEN REDUCTION INTERNAL FIXATION (ORIF) DISTAL RADIAL FRACTURE;  Surgeon: Milly Jakob, MD;  Location: [place];  Service: Orthopedics;  Laterality: Left;  SURGERY REQUEST TIME: 75 MIN  REGIONAL BLOCK  . THORACIC AORTIC ENDOVASCULAR STENT GRAFT  12/13/2019  . THORACIC AORTIC ENDOVASCULAR STENT GRAFT N/A 12/13/2019   Procedure: THORACIC AORTIC ENDOVASCULAR STENT GRAFT WITH RIGHT ABDOMEN CONDUIT;  Surgeon: Elam Dutch, MD;  Location: [place];  Service: Vascular;  Laterality: N/A;  . TOTAL HIP ARTHROPLASTY Right 10/04/2014   Procedure: TOTAL HIP ARTHROPLASTY ANTERIOR APPROACH;  Surgeon: Mcarthur Rossetti, MD;   Location: [place];  Service: Orthopedics;  Laterality: Right;     Current Outpatient Medications  Medication Sig Dispense Refill  . aspirin EC 81 MG tablet Take 81 mg by mouth at bedtime.    . busPIRone (BUSPAR) 7.5 MG tablet     . carvedilol (COREG) 6.25 MG tablet Take 12.5 mg by mouth 2 (two) times daily.     . cetirizine (ZYRTEC) 10 MG tablet Take 10 mg by mouth daily.    Marland Kitchen escitalopram (LEXAPRO) 5 MG tablet Take 5 mg by mouth at bedtime.     . Evolocumab (REPATHA SURECLICK) 778 MG/ML SOAJ Inject 1 Dose into the skin every 14 (fourteen) days. 2.1 mL 11  . ezetimibe (ZETIA) 10 MG tablet Take 10 mg by mouth at bedtime.     . fluticasone (FLONASE) 50 MCG/ACT nasal spray Place into both nostrils.    Marland Kitchen levothyroxine (SYNTHROID) 25 MCG tablet Take 25 mcg by mouth daily.     No current facility-administered medications for this visit.    Allergies:   Statins, Codeine, and Levofloxacin    ROS:  Please see the history of present illness.   Otherwise, review of systems are positive for none.   All other systems are reviewed and negative.    PHYSICAL EXAM: VS:  BP 114/74   Pulse 76   Ht 5\' 5"  (1.651 m)   Wt 159 lb 3.2 oz (72.2 kg)   SpO2 93%   BMI 26.49 kg/m  , BMI Body mass index is 26.49 kg/m. GENERAL:  Well appearing NECK:  No jugular venous distention, waveform within normal limits, carotid upstroke brisk and symmetric, no bruits, no thyromegaly LUNGS:  Clear to auscultation bilaterally CHEST:  Unremarkable HEART:  PMI not displaced or sustained,S1 and S2 within normal limits, no S3, no S4, no clicks, no rubs, no murmurs ABD:  Flat, positive bowel sounds normal in frequency in pitch, no bruits, no rebound, no guarding, no midline pulsatile mass, no hepatomegaly, no splenomegaly EXT:  2 plus pulses throughout, no edema, no cyanosis no clubbing   EKG:  EKG is not ordered today.   Recent Labs: 12/13/2019: ALT 11; BUN 7; Creatinine, Ser 0.73; Magnesium 1.8; Potassium 4.2; Sodium  134 12/14/2019: Hemoglobin 10.5; Platelets 214    Lipid Panel    Component Value Date/Time   CHOL 190 08/08/2020 1446   TRIG 183 (H) 08/08/2020 1446   HDL 44 08/08/2020 1446   CHOLHDL 4.3 08/08/2020 1446   CHOLHDL 3.9 06/30/2015 0833   VLDL 20 06/30/2015 0833   LDLCALC 114 (H) 08/08/2020 1446      Wt Readings from Last 3 Encounters:  08/17/20 159 lb 3.2 oz (72.2 kg)  08/08/20 157 lb 14.4 oz (71.6 kg)  07/27/20 145 lb (65.8 kg)      Other studies Reviewed: Additional studies/  records that were reviewed today include: CT Review of the above records demonstrates:  Please see elsewhere in the note.     ASSESSMENT AND PLAN:  THORACIC AORTIC ANEURYSM:    This is stable on CT as above.  She will continue with risk reduction.   HTN: Her blood pressure is at target.  No change in therapy.   DYSLIPIDEMIA: Her last LDL was 114.  She has not tolerated statins and is being started on Repatha.  She is on Zetia.  She had some questions before starting the Repatha and I answered these and she should be able to start that soon.  She is followed by Dr. Debara Pickett.   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  We talked about this again today.    COVID EDUCATION:   I have given her extensive education on the vaccine and she is not interested in getting it.  Current medicines are reviewed at length with the patient today.  The patient does not have concerns regarding medicines.  The following changes have been made:  no change  Labs/ tests ordered today include: None  No orders of the defined types were placed in this encounter.    Disposition:   FU with me in one year.      Signed, Minus Breeding, MD  08/17/2020 12:30 PM    [facility]

## 2020-08-17 ENCOUNTER — Encounter: Payer: Self-pay | Admitting: Cardiology

## 2020-08-17 ENCOUNTER — Ambulatory Visit (INDEPENDENT_AMBULATORY_CARE_PROVIDER_SITE_OTHER): Payer: Medicare Other | Admitting: Cardiology

## 2020-08-17 ENCOUNTER — Other Ambulatory Visit: Payer: Self-pay

## 2020-08-17 VITALS — BP 114/74 | HR 76 | Ht 65.0 in | Wt 159.2 lb

## 2020-08-17 DIAGNOSIS — Z720 Tobacco use: Secondary | ICD-10-CM

## 2020-08-17 DIAGNOSIS — I712 Thoracic aortic aneurysm, without rupture, unspecified: Secondary | ICD-10-CM

## 2020-08-17 DIAGNOSIS — E785 Hyperlipidemia, unspecified: Secondary | ICD-10-CM | POA: Diagnosis not present

## 2020-08-17 DIAGNOSIS — I10 Essential (primary) hypertension: Secondary | ICD-10-CM

## 2020-08-17 DIAGNOSIS — Z7189 Other specified counseling: Secondary | ICD-10-CM

## 2020-08-17 NOTE — Patient Instructions (Signed)
Medication Instructions:  No Changes In Medications at this time.  *If you need a refill on your cardiac medications before your next appointment, please call your pharmacy*  Lab Work: None Ordered At This Time.  If you have labs (blood work) drawn today and your tests are completely normal, you will receive your results only by:  [place] (if you have MyChart) OR  A paper copy in the mail If you have any lab test that is abnormal or we need to change your treatment, we will call you to review the results.  Testing/Procedures: None Ordered At This Time.   Follow-Up: At [facility], you and your health needs are our priority.  As part of our continuing mission to provide you with exceptional heart care, we have created designated Provider Care Teams.  These Care Teams include your primary Cardiologist (physician) and Advanced Practice Providers (APPs -  Physician Assistants and Nurse Practitioners) who all work together to provide you with the care you need, when you need it.  Your next appointment:   1 year(s)  The format for your next appointment:   In Person  Provider:   Minus Breeding, MD

## 2020-08-21 ENCOUNTER — Telehealth: Payer: Self-pay | Admitting: Internal Medicine

## 2020-08-21 NOTE — Telephone Encounter (Signed)
Patient is calling to inquire about whether or not she needs to reschedule appointment scheduled for 10/12/20 with Dr. Debara Pickett. She states she is not about to take Evolocumab (REPATHA SURECLICK) 897 MG/ML SOAJ medication yet and she assumes she may need to take for a while prior to follow up appointment with Dr. Debara Pickett. Please advise.

## 2020-08-21 NOTE — Telephone Encounter (Signed)
Patient has not yet started Repatha and would like to move appt date out. Advised we can schedule for Dec. Appt made for 11/15/2020

## 2020-08-30 ENCOUNTER — Ambulatory Visit: Payer: Self-pay

## 2020-08-30 ENCOUNTER — Ambulatory Visit (INDEPENDENT_AMBULATORY_CARE_PROVIDER_SITE_OTHER): Payer: Medicare Other | Admitting: Orthopaedic Surgery

## 2020-08-30 ENCOUNTER — Encounter: Payer: Self-pay | Admitting: Orthopaedic Surgery

## 2020-08-30 VITALS — Ht 65.0 in | Wt 159.2 lb

## 2020-08-30 DIAGNOSIS — M545 Low back pain, unspecified: Secondary | ICD-10-CM

## 2020-08-30 DIAGNOSIS — M25559 Pain in unspecified hip: Secondary | ICD-10-CM

## 2020-08-30 MED ORDER — METHYLPREDNISOLONE 4 MG PO TABS: ORAL_TABLET | ORAL | 0 refills | Status: DC

## 2020-08-30 MED ORDER — GABAPENTIN 300 MG PO CAPS: 300.0000 mg | ORAL_CAPSULE | Freq: Every day | ORAL | 1 refills | Status: AC

## 2020-08-30 MED ORDER — METHOCARBAMOL 500 MG PO TABS: 500.0000 mg | ORAL_TABLET | Freq: Four times a day (QID) | ORAL | 1 refills | Status: AC | PRN

## 2020-08-30 NOTE — Progress Notes (Signed)
Office Visit Note   Patient: Michaela Guerra           Date of Birth: August 04, 1946           MRN: [id number] Visit Date: 08/30/2020              Requested by: Bernerd Limbo, MD [place],  [place] [id number] PCP: Bernerd Limbo, MD   Assessment & Plan: Visit Diagnoses:  1. Hip pain   2. Low back pain, unspecified back pain laterality, unspecified chronicity, unspecified whether sciatica present     Plan: I do feel that she would benefit from formal physical therapy on her back and legs but she would like to try just medical treatment first.  We will try a 6-day steroid taper.  She is not sleeping comfortably at night and complaining some numbness in her feet still tried Neurontin 300 mg to take at bedtime and Robaxin as a muscle relaxant.  All questions and concerns were answered and addressed.  We will see her back in 3 weeks to see how this medical regimen has done for her.  Follow-Up Instructions: Return in about 3 years (around 08/31/2023).   Orders:  Orders Placed This Encounter  Procedures  . XR HIP UNILAT W OR W/O PELVIS 1V LEFT  . XR Lumbar Spine 2-3 Views   Meds ordered this encounter  Medications  . methylPREDNISolone (MEDROL) 4 MG tablet    Sig: Medrol dose pack. Take as instructed    Dispense:  21 tablet    Refill:  0  . gabapentin (NEURONTIN) 300 MG capsule    Sig: Take 1 capsule (300 mg total) by mouth at bedtime.    Dispense:  60 capsule    Refill:  1  . methocarbamol (ROBAXIN) 500 MG tablet    Sig: Take 1 tablet (500 mg total) by mouth every 6 (six) hours as needed for muscle spasms.    Dispense:  60 tablet    Refill:  1      Procedures: No procedures performed   Clinical Data: No additional findings.   Subjective: Chief Complaint  Patient presents with  . Left Hip - Pain  The patient is a 74 year old female that I have not seen in several years now.  Actually replaced her right hip in 2015.  Her left hip is been hurting  her but she has been having more pain in the lumbar spine across the back and into both sciatic regions.  She has a significant history of a thoracic aneurysm that was treated earlier this year.  She has had at least 2 different surgeries as a relates to that.  She denies any groin pain.  She does report weakness in her legs and pain in both knees as well.  She denies being a diabetic.  She denies any acute change in her medical status and has been recovering slowly over this year from the thoracic aneurysm.  HPI  Review of Systems She currently denies any headache, chest pain, shortness of breath, fever, chills, nausea, vomiting  Objective: Vital Signs: Ht 5\' 5"  (1.651 m)   Wt 159 lb 3.2 oz (72.2 kg)   BMI 26.49 kg/m   Physical Exam She is alert and orient x3 and in no acute distress Ortho Exam She ambulates without assistive device.  Both hips move smoothly and fluidly with no pain in the groin at all.  She does not have a significant weakness in either leg and  her knee exams are normal bilaterally.  There is normal sensation in both her feet but she does report numbness and tingling on occasion.  She does have pain across the lower lumbar spine and pain with flexion extension of the lumbar spine. Specialty Comments:  No specialty comments available.  Imaging: XR HIP UNILAT W OR W/O PELVIS 1V LEFT  Result Date: 08/30/2020 An AP pelvis and lateral left hip shows no acute findings with the left hip.  The joint space is still well-maintained.  There is a right total hip arthroplasty that appears well aligned with no complicating features on the AP view.  XR Lumbar Spine 2-3 Views  Result Date: 08/30/2020 2 views of lumbar spine show no acute findings.    PMFS History: Patient Active Problem List   Diagnosis Date Noted  . Educated about COVID-19 virus infection 02/02/2020  . Aneurysm of thoracic aorta ([facility]) 12/13/2019  . Thoracic aortic aneurysm without rupture ([facility]) 12/06/2019  .  Leg swelling 04/17/2017  . Precordial chest pain 04/17/2017  . Other fatigue 04/17/2017  . SOB (shortness of breath) 04/17/2017  . Hypothyroidism due to Hashimoto's thyroiditis 08/25/2015  . Cerebrovascular disease 07/21/2015  . Hyperlipidemia 07/21/2015  . Tobacco abuse 07/21/2015  . Memory deficits 06/19/2015  . Right wrist fracture 10/17/2014  . Dyslipidemia 10/12/2014  . Constipation 10/12/2014  . Fracture of femoral neck, right ([facility]) 10/12/2014  . Fall   . Hip fracture ([facility]) 10/03/2014  . COPD (chronic obstructive pulmonary disease) ([facility]) 10/03/2014  . CVA (cerebral infarction) 11/13/2013  . Hypokalemia 11/13/2013  . h/o CVA 11/13/2013  . Malignant neoplasm of female breast ([facility]) 05/03/2010  . SMOKER 05/03/2010  . ARTHRITIS 05/03/2010  . ALLERGIC RHINITIS 09/07/2009  . COLONIC POLYPS, HYPERPLASTIC 07/12/2009  . IBS 10/28/2008  . Hypothyroidism 07/07/2008  . ANXIETY 03/15/2008  . ALCOHOL ABUSE 03/15/2008  . DEPRESSION 03/15/2008  . OSTEOPENIA 02/11/2008  . Essential hypertension 02/02/2008  . ANEMIA, IRON DEFICIENCY NOS 10/08/2007  . GERD 08/21/2007   Past Medical History:  Diagnosis Date  . Allergy   . Aneurysm of aorta ([facility]) 12/2019  . Anxiety   . Arthritis   . Bleeding diathesis ([facility])    Hx of   . Breast cancer ([facility])    Adenocarcinoma, radiation and surgery; at age 57  . Closed fracture of left distal radius   . COLONIC POLYPS, HYPERPLASTIC 07/12/2009   Qualifier: Diagnosis of  By: Amil Amen MD, Benjamine Mola    . Complication of anesthesia    Hard to wake up.  Marland Kitchen COPD (chronic obstructive pulmonary disease) ([facility])    heavy smoker 1ppd  . CVA (cerebral vascular accident) ([facility]) 11/2013   no deficits  . DECREASED HEARING, LEFT EAR 05/03/2010   Qualifier: Diagnosis of  By: Asa Lente MD, Jannifer Rodney   . Depression   . Derangement of anterior horn of lateral meniscus 04/24/2009   Annotation: Underwent right knee arthroscopic surgery 09/20/09: For  chondromalacia and  anterolateral meniscectomy Qualifier: Diagnosis of  By: Amil Amen MD, Benjamine Mola    . GERD (gastroesophageal reflux disease)   . Hyperlipidemia   . Hypertension   . Hypothyroidism   . Polio    Dx at age 34 yrs  . Smoker    1ppd  . 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 Breast cancer Mother   . Stroke Mother   . Stroke Father   .  Breast cancer Sister   . Hypertension Sister   . Hypertension Sister   . Dementia Sister   . Hemophilia Grandson   . Hemophilia Daughter     Past Surgical History:  Procedure Laterality Date  . BREAST SURGERY Left    lumpectomy  . CAROTID-SUBCLAVIAN BYPASS GRAFT Left 12/06/2019   Procedure: BYPASS GRAFT CAROTID-SUBCLAVIAN LEFT;  Surgeon: Elam Dutch, MD;  Location: [facility] OR;  Service: Vascular;  Laterality: Left;  . EMBOLECTOMY Right 12/13/2019   Procedure: EMBOLECTOMY RIGHT SUBCLAVIAN;  Surgeon: Elam Dutch, MD;  Location: [facility];  Service: Vascular;  Laterality: Right;  . EYE SURGERY     Bilateral cataracts  . OPEN REDUCTION INTERNAL FIXATION (ORIF) DISTAL RADIAL FRACTURE Left 10/18/2019   Procedure: OPEN REDUCTION INTERNAL FIXATION (ORIF) DISTAL RADIAL FRACTURE;  Surgeon: Milly Jakob, MD;  Location: [facility];  Service: Orthopedics;  Laterality: Left;  SURGERY REQUEST TIME: 75 MIN  REGIONAL BLOCK  . THORACIC AORTIC ENDOVASCULAR STENT GRAFT  12/13/2019  . THORACIC AORTIC ENDOVASCULAR STENT GRAFT N/A 12/13/2019   Procedure: THORACIC AORTIC ENDOVASCULAR STENT GRAFT WITH RIGHT ABDOMEN CONDUIT;  Surgeon: Elam Dutch, MD;  Location: [facility];  Service: Vascular;  Laterality: N/A;  . TOTAL HIP ARTHROPLASTY Right 10/04/2014   Procedure: TOTAL HIP ARTHROPLASTY ANTERIOR APPROACH;  Surgeon: Mcarthur Rossetti, MD;  Location: [facility];  Service: Orthopedics;  Laterality: Right;   Social History   Occupational History  . Occupation: Retired   Tobacco Use  . Smoking status: Current Every Day Smoker    Packs/day: 1.00    Years: 40.00    Pack years: 40.00  . Smokeless tobacco: Never Used  . Tobacco comment: Has cut back to 1 ppd she is aware she needs to quit   Vaping Use  . Vaping Use: Never used  Substance and Sexual Activity  . Alcohol use: Yes    Alcohol/week: 0.0 standard drinks    Comment: once every two weeks  . Drug use: No  . Sexual activity: Not Currently

## 2020-09-04 ENCOUNTER — Emergency Department (HOSPITAL_COMMUNITY)
Admission: EM | Admit: 2020-09-04 | Discharge: 2020-09-05 | Disposition: A | Discharge status: Home / Self Care | Payer: Medicare Other | Attending: Emergency Medicine | Admitting: Emergency Medicine

## 2020-09-04 ENCOUNTER — Ambulatory Visit: Payer: Medicare Other | Admitting: Orthopaedic Surgery

## 2020-09-04 ENCOUNTER — Encounter (HOSPITAL_COMMUNITY): Payer: Self-pay

## 2020-09-04 ENCOUNTER — Other Ambulatory Visit: Payer: Self-pay

## 2020-09-04 ENCOUNTER — Emergency Department (HOSPITAL_COMMUNITY): Payer: Medicare Other

## 2020-09-04 DIAGNOSIS — Z79899 Other long term (current) drug therapy: Secondary | ICD-10-CM | POA: Diagnosis not present

## 2020-09-04 DIAGNOSIS — E039 Hypothyroidism, unspecified: Secondary | ICD-10-CM | POA: Insufficient documentation

## 2020-09-04 DIAGNOSIS — J449 Chronic obstructive pulmonary disease, unspecified: Secondary | ICD-10-CM | POA: Insufficient documentation

## 2020-09-04 DIAGNOSIS — F17200 Nicotine dependence, unspecified, uncomplicated: Secondary | ICD-10-CM | POA: Diagnosis not present

## 2020-09-04 DIAGNOSIS — Y9389 Activity, other specified: Secondary | ICD-10-CM | POA: Insufficient documentation

## 2020-09-04 DIAGNOSIS — M25552 Pain in left hip: Secondary | ICD-10-CM | POA: Diagnosis not present

## 2020-09-04 DIAGNOSIS — W010XXA Fall on same level from slipping, tripping and stumbling without subsequent striking against object, initial encounter: Secondary | ICD-10-CM | POA: Diagnosis not present

## 2020-09-04 DIAGNOSIS — Z79890 Hormone replacement therapy (postmenopausal): Secondary | ICD-10-CM | POA: Insufficient documentation

## 2020-09-04 DIAGNOSIS — M47816 Spondylosis without myelopathy or radiculopathy, lumbar region: Secondary | ICD-10-CM | POA: Diagnosis not present

## 2020-09-04 DIAGNOSIS — Z8673 Personal history of transient ischemic attack (TIA), and cerebral infarction without residual deficits: Secondary | ICD-10-CM | POA: Insufficient documentation

## 2020-09-04 DIAGNOSIS — I700 Atherosclerosis of aorta: Secondary | ICD-10-CM | POA: Insufficient documentation

## 2020-09-04 DIAGNOSIS — I10 Essential (primary) hypertension: Secondary | ICD-10-CM | POA: Diagnosis not present

## 2020-09-04 DIAGNOSIS — S32010A Wedge compression fracture of first lumbar vertebra, initial encounter for closed fracture: Secondary | ICD-10-CM | POA: Diagnosis not present

## 2020-09-04 DIAGNOSIS — Y9289 Other specified places as the place of occurrence of the external cause: Secondary | ICD-10-CM | POA: Insufficient documentation

## 2020-09-04 DIAGNOSIS — Z853 Personal history of malignant neoplasm of breast: Secondary | ICD-10-CM | POA: Insufficient documentation

## 2020-09-04 DIAGNOSIS — Z7982 Long term (current) use of aspirin: Secondary | ICD-10-CM | POA: Insufficient documentation

## 2020-09-04 DIAGNOSIS — E785 Hyperlipidemia, unspecified: Secondary | ICD-10-CM | POA: Diagnosis not present

## 2020-09-04 DIAGNOSIS — S32040A Wedge compression fracture of fourth lumbar vertebra, initial encounter for closed fracture: Secondary | ICD-10-CM

## 2020-09-04 DIAGNOSIS — I712 Thoracic aortic aneurysm, without rupture: Secondary | ICD-10-CM | POA: Diagnosis not present

## 2020-09-04 DIAGNOSIS — Z96641 Presence of right artificial hip joint: Secondary | ICD-10-CM | POA: Diagnosis not present

## 2020-09-04 DIAGNOSIS — S34109A Unspecified injury to unspecified level of lumbar spinal cord, initial encounter: Secondary | ICD-10-CM | POA: Diagnosis present

## 2020-09-04 DIAGNOSIS — W19XXXA Unspecified fall, initial encounter: Secondary | ICD-10-CM

## 2020-09-04 NOTE — ED Triage Notes (Addendum)
Pt arrives EMS for a fall on Friday. Attempted to wait and see orthopedics today but was too painful to get in car. C/o left hip and lower back pain. Hx of right hip replacement.   EMS administered 158mcg fentanyl and 4mg  zofran.   20g R ac

## 2020-09-05 ENCOUNTER — Emergency Department (HOSPITAL_COMMUNITY): Payer: Medicare Other

## 2020-09-05 ENCOUNTER — Ambulatory Visit: Payer: Medicare Other | Admitting: Physician Assistant

## 2020-09-05 DIAGNOSIS — S32010A Wedge compression fracture of first lumbar vertebra, initial encounter for closed fracture: Secondary | ICD-10-CM | POA: Diagnosis not present

## 2020-09-05 MED ORDER — HYDROCODONE-ACETAMINOPHEN 5-325 MG PO TABS
1.0000 | ORAL_TABLET | Freq: Once | ORAL | Status: AC
  Administered 2020-09-05: 1 via ORAL
  Filled 2020-09-05: qty 1

## 2020-09-05 MED ORDER — OXYCODONE-ACETAMINOPHEN 5-325 MG PO TABS: 2.0000 | ORAL_TABLET | ORAL | 0 refills | Status: DC | PRN

## 2020-09-05 MED ORDER — OXYCODONE-ACETAMINOPHEN 5-325 MG PO TABS
1.0000 | ORAL_TABLET | Freq: Once | ORAL | Status: AC
  Administered 2020-09-05: 1 via ORAL
  Filled 2020-09-05: qty 1

## 2020-09-05 NOTE — ED Notes (Signed)
Back brace applied, patient states she has a ride to pick her up, verbalized understanding of DC instructions.

## 2020-09-05 NOTE — Progress Notes (Signed)
Orthopedic Tech Progress Note Patient Details:  Michaela Guerra Oct 27, 1946 [id number]  Ortho Devices Ortho Device/Splint Location: Called and Routed LSO brace order to [employer] 09/05/2020, 7:15 AM

## 2020-09-05 NOTE — Discharge Instructions (Addendum)
You were seen in the ED today with a compression fracture of your lower spine. We have placed a brace that needs to stay on when awake, up, and moving. Please call the spine doctor listed today to schedule a follow up appointment. We have called in some pain medications to your pharmacy.

## 2020-09-05 NOTE — ED Provider Notes (Signed)
Blood pressure (!) 143/116, pulse (!) 51, temperature 97.7 F (36.5 C), temperature source Oral, resp. rate 15, height 5\' 5"  (1.651 m), weight 72.2 kg, SpO2 (!) 88 %.  Assuming care from Dr. Dina Rich.  In short, Michaela Guerra is a 74 y.o. female with a chief complaint of Fall .  Refer to the original H&P for additional details.  The current plan of care is to f/u with patient after LSO brace.  LSO brace placed. Patient ready for discharge.    Margette Fast, MD 09/05/20 [phone]

## 2020-09-05 NOTE — ED Provider Notes (Signed)
[facility] DEPT Provider Note   CSN: [id number] Arrival date & time: 09/04/20  2009     History Chief Complaint  Patient presents with  . Fall    Michaela Guerra is a 74 y.o. female.  HPI     This is a 74 year old female with a history of COPD, CVA, hypertension, hyperlipidemia who presents following a fall.  Patient reports that she fell on Friday.  She describes getting tripped up by carpet and falling backwards.  She reports that she struck her lower back on the edge of a piece of furniture and has had left hip pain.  She reports prior right hip replacement.  She has been ambulatory but rates her pain at "50 out of 10."  She was given pain medication by EMS and states that it helps some.  Denies weakness, numbness, tingling of the lower extremities, bowel or bladder issues.  Denies hitting her head or loss of consciousness.  She is not on any blood thinners. Past Medical History:  Diagnosis Date  . Allergy   . Aneurysm of aorta ([place]) 12/2019  . Anxiety   . Arthritis   . Bleeding diathesis ([place])    Hx of   . Breast cancer ([place])    Adenocarcinoma, radiation and surgery; at age 55  . Closed fracture of left distal radius   . COLONIC POLYPS, HYPERPLASTIC 07/12/2009   Qualifier: Diagnosis of  By: Amil Amen MD, Benjamine Mola    . Complication of anesthesia    Hard to wake up.  Marland Kitchen COPD (chronic obstructive pulmonary disease) (HCC)    heavy smoker 1ppd  . CVA (cerebral vascular accident) ([place]) 11/2013   no deficits  . DECREASED HEARING, LEFT EAR 05/03/2010   Qualifier: Diagnosis of  By: Asa Lente MD, Jannifer Rodney   . Depression   . Derangement of anterior horn of lateral meniscus 04/24/2009   Annotation: Underwent right knee arthroscopic surgery 09/20/09: For  chondromalacia and anterolateral meniscectomy Qualifier: Diagnosis of  By: Amil Amen MD, Benjamine Mola    . GERD (gastroesophageal reflux disease)   . Hyperlipidemia   . Hypertension   . Hypothyroidism   .  Polio    Dx at age 41 yrs  . Smoker    1ppd  . Thoracic aortic aneurysm (TAA) (HCC)    6.6 cm posterior arotic arch TAA 11/09/19    Patient Active Problem List   Diagnosis Date Noted  . Educated about COVID-19 virus infection 02/02/2020  . Aneurysm of thoracic aorta ([place]) 12/13/2019  . Thoracic aortic aneurysm without rupture ([place]) 12/06/2019  . Leg swelling 04/17/2017  . Precordial chest pain 04/17/2017  . Other fatigue 04/17/2017  . SOB (shortness of breath) 04/17/2017  . Hypothyroidism due to Hashimoto's thyroiditis 08/25/2015  . Cerebrovascular disease 07/21/2015  . Hyperlipidemia 07/21/2015  . Tobacco abuse 07/21/2015  . Memory deficits 06/19/2015  . Right wrist fracture 10/17/2014  . Dyslipidemia 10/12/2014  . Constipation 10/12/2014  . Fracture of femoral neck, right ([place]) 10/12/2014  . Fall   . Hip fracture ([place]) 10/03/2014  . COPD (chronic obstructive pulmonary disease) ([place]) 10/03/2014  . CVA (cerebral infarction) 11/13/2013  . Hypokalemia 11/13/2013  . h/o CVA 11/13/2013  . Malignant neoplasm of female breast ([place]) 05/03/2010  . SMOKER 05/03/2010  . ARTHRITIS 05/03/2010  . ALLERGIC RHINITIS 09/07/2009  . COLONIC POLYPS, HYPERPLASTIC 07/12/2009  . IBS 10/28/2008  . Hypothyroidism 07/07/2008  . ANXIETY 03/15/2008  . ALCOHOL ABUSE 03/15/2008  . DEPRESSION 03/15/2008  . OSTEOPENIA 02/11/2008  .  Essential hypertension 02/02/2008  . ANEMIA, IRON DEFICIENCY NOS 10/08/2007  . GERD 08/21/2007    Past Surgical History:  Procedure Laterality Date  . BREAST SURGERY Left    lumpectomy  . CAROTID-SUBCLAVIAN BYPASS GRAFT Left 12/06/2019   Procedure: BYPASS GRAFT CAROTID-SUBCLAVIAN LEFT;  Surgeon: Elam Dutch, MD;  Location: [facility] OR;  Service: Vascular;  Laterality: Left;  . EMBOLECTOMY Right 12/13/2019   Procedure: EMBOLECTOMY RIGHT SUBCLAVIAN;  Surgeon: Elam Dutch, MD;  Location: [facility];  Service: Vascular;  Laterality: Right;  . EYE SURGERY     Bilateral  cataracts  . OPEN REDUCTION INTERNAL FIXATION (ORIF) DISTAL RADIAL FRACTURE Left 10/18/2019   Procedure: OPEN REDUCTION INTERNAL FIXATION (ORIF) DISTAL RADIAL FRACTURE;  Surgeon: Milly Jakob, MD;  Location: [facility];  Service: Orthopedics;  Laterality: Left;  SURGERY REQUEST TIME: 75 MIN  REGIONAL BLOCK  . THORACIC AORTIC ENDOVASCULAR STENT GRAFT  12/13/2019  . THORACIC AORTIC ENDOVASCULAR STENT GRAFT N/A 12/13/2019   Procedure: THORACIC AORTIC ENDOVASCULAR STENT GRAFT WITH RIGHT ABDOMEN CONDUIT;  Surgeon: Elam Dutch, MD;  Location: [facility];  Service: Vascular;  Laterality: N/A;  . TOTAL HIP ARTHROPLASTY Right 10/04/2014   Procedure: TOTAL HIP ARTHROPLASTY ANTERIOR APPROACH;  Surgeon: Mcarthur Rossetti, MD;  Location: [facility];  Service: Orthopedics;  Laterality: Right;     OB History   No obstetric history on file.     Family History  Problem Relation Age of Onset  . Breast cancer Mother   . Stroke Mother   . Stroke Father   . Breast cancer Sister   . Hypertension Sister   . Hypertension Sister   . Dementia Sister   . Hemophilia Grandson   . Hemophilia Daughter     Social History   Tobacco Use  . Smoking status: Current Every Day Smoker    Packs/day: 1.00    Years: 40.00    Pack years: 40.00  . Smokeless tobacco: Never Used  . Tobacco comment: Has cut back to 1 ppd she is aware she needs to quit   Vaping Use  . Vaping Use: Never used  Substance Use Topics  . Alcohol use: Yes    Alcohol/week: 0.0 standard drinks    Comment: once every two weeks  . Drug use: No    Home Medications Prior to Admission medications   Medication Sig Start Date End Date Taking? Authorizing Provider  aspirin EC 81 MG tablet Take 81 mg by mouth at bedtime.    [provider]  busPIRone (BUSPAR) 7.5 MG tablet  06/28/20   [provider]  carvedilol (COREG) 6.25 MG tablet Take 12.5 mg by mouth 2 (two) times daily.  03/06/17   [provider]    cetirizine (ZYRTEC) 10 MG tablet Take 10 mg by mouth daily.    [provider]  escitalopram (LEXAPRO) 5 MG tablet Take 5 mg by mouth at bedtime.     [provider]  Evolocumab (REPATHA SURECLICK) 130 MG/ML SOAJ Inject 1 Dose into the skin every 14 (fourteen) days. 08/10/20   Hilty, Nadean Corwin, MD  ezetimibe (ZETIA) 10 MG tablet Take 10 mg by mouth at bedtime.  02/10/17   [provider]  fluticasone (FLONASE) 50 MCG/ACT nasal spray Place into both nostrils. 03/13/20   [provider]  gabapentin (NEURONTIN) 300 MG capsule Take 1 capsule (300 mg total) by mouth at bedtime. 08/30/20   Mcarthur Rossetti, MD  levothyroxine (SYNTHROID) 25 MCG tablet Take 25  mcg by mouth daily. 01/06/20   [provider]  methocarbamol (ROBAXIN) 500 MG tablet Take 1 tablet (500 mg total) by mouth every 6 (six) hours as needed for muscle spasms. 08/30/20   Mcarthur Rossetti, MD  methylPREDNISolone (MEDROL) 4 MG tablet Medrol dose pack. Take as instructed 08/30/20   Mcarthur Rossetti, MD  oxyCODONE-acetaminophen (PERCOCET/ROXICET) 5-325 MG tablet Take 2 tablets by mouth every 4 (four) hours as needed for severe pain. 09/05/20   Hillary Struss, Barbette Hair, MD  omeprazole (PRILOSEC OTC) 20 MG tablet Take 1 tablet by mouth daily. 03/06/17 11/18/19  [provider]    Allergies    Statins, Codeine, and Levofloxacin  Review of Systems   Review of Systems  Constitutional: Negative for fever.  Respiratory: Negative for shortness of breath.   Gastrointestinal: Negative for abdominal pain.  Genitourinary: Negative for dysuria.  Musculoskeletal: Positive for back pain.       Hip pain  Neurological: Negative for weakness.  All other systems reviewed and are negative.   Physical Exam Updated Vital Signs BP (!) 143/116   Pulse (!) 51   Temp 97.7 F (36.5 C) (Oral)   Resp 15   Ht 1.651 m (5\' 5" )   Wt 72.2 kg   SpO2 (!) 88%   BMI 26.49 kg/m   Physical  Exam Vitals and nursing note reviewed.  Constitutional:      Appearance: She is well-developed.     Comments: Chronically ill-appearing but nontoxic  HENT:     Head: Normocephalic and atraumatic.     Nose: Nose normal.     Mouth/Throat:     Mouth: Mucous membranes are moist.  Eyes:     Pupils: Pupils are equal, round, and reactive to light.  Cardiovascular:     Rate and Rhythm: Normal rate and regular rhythm.     Heart sounds: Normal heart sounds.  Pulmonary:     Effort: Pulmonary effort is normal. No respiratory distress.     Breath sounds: No wheezing.  Abdominal:     General: Bowel sounds are normal.     Palpations: Abdomen is soft.     Tenderness: There is no abdominal tenderness.  Musculoskeletal:     Cervical back: Neck supple.     Comments: Tenderness palpation lower lumbar spine, no step-off or deformity noted, pain with range of motion of the left hip  Skin:    General: Skin is warm and dry.  Neurological:     Mental Status: She is alert and oriented to person, place, and time.     Comments: 5 out of 5 strength bilateral lower extremities, normal reflexes bilaterally  Psychiatric:        Mood and Affect: Mood normal.     ED Results / Procedures / Treatments   Labs (all labs ordered are listed, but only abnormal results are displayed) Labs Reviewed - No data to display  EKG None  Radiology CT Lumbar Spine Wo Contrast  Result Date: 09/05/2020 CLINICAL DATA:  Low back pain after a fall on 09/01/2020 EXAM: CT LUMBAR SPINE WITHOUT CONTRAST TECHNIQUE: Multidetector CT imaging of the lumbar spine was performed without intravenous contrast administration. Multiplanar CT image reconstructions were also generated. COMPARISON:  Lumbar spine radiographs 08/30/2020 FINDINGS: Segmentation: 5 lumbar type vertebral bodies. Partial sacralization of L5. Alignment: Slight anterior subluxation at L3-4. Otherwise normal alignment. Normal alignment of the facet joints. Vertebrae:  Acute appearing compression of the inferior endplate of L4 with mild retrolisthesis of an inferior  fracture fragment. Fracture lines extend to the posterior vertebral body. No evident involvement of the pedicles or posterior elements. Focal area sclerosis demonstrated in the right lateral aspect of T12 could represent a bone island or an impaction fracture. Paraspinal and other soft tissues: No paraspinal soft tissue swelling or mass. Calcification of the visualized aorta. Disc levels: Mildly narrowed intervertebral disc heights with endplate hypertrophic change consistent with degenerative changes. Degenerative changes in the facet joints. Suggestion of mild central canal stenosis at L3-4 likely caused by bulging disc annulus and facet joint hypertrophy. IMPRESSION: 1. Acute appearing compression of the inferior endplate of L4 with mild retrolisthesis of an inferior fracture fragment. No evident involvement of the pedicles or posterior elements. 2. Degenerative changes throughout the lumbar spine as above. 3. Focal area sclerosis in the right lateral aspect of T12 could represent a bone island or an impaction fracture. 4. Aortic atherosclerosis. Aortic Atherosclerosis (ICD10-I70.0). Electronically Signed   By: Lucienne Capers M.D.   On: 09/05/2020 02:04   CT PELVIS WO CONTRAST  Result Date: 09/05/2020 CLINICAL DATA:  [place] on 09/01/2020. Pelvic trauma with left hip and lower back pain. EXAM: CT PELVIS WITHOUT CONTRAST TECHNIQUE: Multidetector CT imaging of the pelvis was performed following the standard protocol without intravenous contrast. COMPARISON:  CT abdomen and pelvis 01/10/2020. Radiographs 09/04/2020 FINDINGS: Urinary Tract: Tiny gas bubble in the bladder could arise from instrumentation or cystitis. No bladder wall thickening. Pelvic right kidney without hydronephrosis. Bowel: Visualized portions of the colon and small bowel are not abnormally distended. No wall thickening or inflammatory changes are  appreciated. Vascular/Lymphatic: Calcification of the lower abdominal aorta and iliac arteries. No aneurysm. Reproductive: Uterus and ovaries are not enlarged. No abnormal pelvic mass or lymphadenopathy. Other:  No free air or free fluid in the pelvis. Musculoskeletal: Right hip arthroplasty, incompletely included within the field of view. The sacrum, pelvis, and left hip appear intact. No acute displaced fractures are identified. No focal bone lesion or bone destruction. SI joints are intact and symmetrical with mild degenerative change. Left hip musculature appears intact. No intramuscular hematoma is identified. IMPRESSION: 1. No acute displaced fractures identified. Right hip arthroplasty, incompletely included within the field of view. 2. Tiny gas bubble in the bladder could arise from instrumentation or cystitis. 3. Pelvic right kidney. 4. Aortic atherosclerosis. Aortic Atherosclerosis (ICD10-I70.0). Electronically Signed   By: Lucienne Capers M.D.   On: 09/05/2020 01:59   DG Hip Unilat With Pelvis 2-3 Views Left  Result Date: 09/04/2020 CLINICAL DATA:  Fall on Friday.  LEFT hip and lower back pain EXAM: DG HIP (WITH OR WITHOUT PELVIS) 2-3V LEFT COMPARISON:  08/30/2020 FINDINGS: Osteopenia. No sign of fracture of the bony pelvis. Post RIGHT hip arthroplasty. No sign of fracture about the LEFT hip on AP and lateral views. IMPRESSION: No acute findings. Post RIGHT hip arthroplasty. Electronically Signed   By: Zetta Bills M.D.   On: 09/04/2020 20:46    Procedures Procedures (including critical care time)  Medications Ordered in ED Medications  HYDROcodone-acetaminophen (NORCO/VICODIN) 5-325 MG per tablet 1 tablet (1 tablet Oral Given 09/05/20 0155)  oxyCODONE-acetaminophen (PERCOCET/ROXICET) 5-325 MG per tablet 1 tablet (1 tablet Oral Given 09/05/20 1610)    ED Course  I have reviewed the triage vital signs and the nursing notes.  Pertinent labs & imaging results that were available during my  care of the patient were reviewed by me and considered in my medical decision making (see chart for details).  Clinical Course as  of Sep 05 736  Tue Sep 05, 2020  0254 Spoke with neurosurgery.  Recommend LSO for pt and follow-up in 2 weeks.   [CH]    Clinical Course User Index [CH] Jael Waldorf, Barbette Hair, MD   MDM Rules/Calculators/A&P                          Patient presents with left hip and back pain.  Reports fall 3 days ago.  Nontoxic vital signs are reassuring.  Plain films of the hip were reviewed and showed no signs of fracture.  Will obtain a CT given her age and likely some osteopenia.  CT pelvis without contrast does not show any fracture, CT of the lumbar spine does show L4 endplate fracture.  She is neurologically intact without symptoms of cauda equina.  See clinical course above.  Patient is awaiting LSO brace.  Final Clinical Impression(s) / ED Diagnoses Final diagnoses:  Fall, initial encounter  Compression fracture of L4 vertebra, initial encounter [facility])    Rx / DC Orders ED Discharge Orders         Ordered    oxyCODONE-acetaminophen (PERCOCET/ROXICET) 5-325 MG tablet  Every 4 hours PRN        09/05/20 0736           Merryl Hacker, MD 09/05/20 [phone]

## 2020-09-06 ENCOUNTER — Telehealth: Payer: Self-pay

## 2020-09-06 ENCOUNTER — Other Ambulatory Visit: Payer: Self-pay | Admitting: Orthopaedic Surgery

## 2020-09-06 MED ORDER — OXYCODONE-ACETAMINOPHEN 10-325 MG PO TABS: 1.0000 | ORAL_TABLET | Freq: Four times a day (QID) | ORAL | 0 refills | Status: DC | PRN

## 2020-09-06 NOTE — Telephone Encounter (Signed)
I will send in the stronger pain medication.  It is okay for her to have orders for the rolling walker as well as bedside commode with a seat as well as a lift chair.  She has a L4 compression fracture.

## 2020-09-06 NOTE — Telephone Encounter (Signed)
Can you get the commode/lift chair for her

## 2020-09-06 NOTE — Telephone Encounter (Signed)
I am sorry I just saw this. I am out of office today and trying to make some calls. Fax to [employer] is [phone]. Sorry.

## 2020-09-06 NOTE — Telephone Encounter (Signed)
Patient's daughter Audelia Acton called stating that she was advised by patient's nurse to call to see if patient could have a Rx for Oxycodone 10mg .  Stated that the Oxycodone 5mg  is not helping at all?   Would like to know if she can get some orders for a bedside commode, a rollator walker with a seat, and a lift chair?  CB# [phone]. Please advise.  Thank you

## 2020-09-06 NOTE — Telephone Encounter (Signed)
faxed

## 2020-09-07 ENCOUNTER — Ambulatory Visit (INDEPENDENT_AMBULATORY_CARE_PROVIDER_SITE_OTHER): Payer: Medicare Other | Admitting: Physician Assistant

## 2020-09-07 ENCOUNTER — Other Ambulatory Visit: Payer: Self-pay

## 2020-09-07 ENCOUNTER — Encounter: Payer: Self-pay | Admitting: Physician Assistant

## 2020-09-07 DIAGNOSIS — M545 Low back pain, unspecified: Secondary | ICD-10-CM

## 2020-09-07 NOTE — Progress Notes (Signed)
HPI: Michaela Guerra comes in today for the left ear back pain status post fall.  She fell on 09/04/2020 went to the [facility], ER where she was found to have a compression fracture involving the inferior endplate of L4 and slight retrolisthesis of L4 on 5.  She was placed in an LSO brace which she is not wearing.  She states that the brace does not fit well whenever she is seated.  She is due to see neurosurgery.  Comes in today mainly for commode elevator prescription.  She presents today with her daughter. Patient denies any bladder dysfunction.  She did have 2 episodes of fecal incontinence feels that this was due to medication.  She denies any saddle anesthesia like symptoms.  She states that most of her pain is down the left leg from the hip down into the lower leg and foot.  Some pain down the right leg.  Review of systems: See HPI otherwise negative.  Physical exam: General well-developed well-nourished female no acute distress seated in wheelchair. Psych alert and oriented x3 . Lower extremities: Sensation grossly intact bilateral feet to light touch.  She has 5 out of 5 strength throughout the lower extremities against resistance.  Straight leg raise was not performed.  Impression: L4 endplate compression fracture with radicular symptoms  Plan: She will follow up with neurosurgery as planned.  She will follow-up with [place] as needed.  Did write her a prescription for commode elevators.  Discussed with her the need to wear the LSO brace.  Questions were encouraged and answered of both the patient and her daughters present throughout the examination today.

## 2020-09-20 ENCOUNTER — Ambulatory Visit: Payer: Medicare Other | Admitting: Orthopaedic Surgery

## 2020-09-25 ENCOUNTER — Ambulatory Visit: Payer: Medicare Other | Admitting: Orthopaedic Surgery

## 2020-10-12 ENCOUNTER — Ambulatory Visit: Payer: Medicare Other | Admitting: Internal Medicine

## 2020-11-15 ENCOUNTER — Encounter: Payer: Self-pay | Admitting: Internal Medicine

## 2020-11-15 ENCOUNTER — Ambulatory Visit (INDEPENDENT_AMBULATORY_CARE_PROVIDER_SITE_OTHER): Payer: Medicare Other | Admitting: Internal Medicine

## 2020-11-15 ENCOUNTER — Other Ambulatory Visit: Payer: Self-pay

## 2020-11-15 VITALS — BP 149/87 | HR 69 | Ht 64.0 in | Wt 153.6 lb

## 2020-11-15 DIAGNOSIS — T466X5A Adverse effect of antihyperlipidemic and antiarteriosclerotic drugs, initial encounter: Secondary | ICD-10-CM

## 2020-11-15 DIAGNOSIS — I712 Thoracic aortic aneurysm, without rupture, unspecified: Secondary | ICD-10-CM

## 2020-11-15 DIAGNOSIS — I10 Essential (primary) hypertension: Secondary | ICD-10-CM

## 2020-11-15 DIAGNOSIS — E785 Hyperlipidemia, unspecified: Secondary | ICD-10-CM | POA: Diagnosis not present

## 2020-11-15 DIAGNOSIS — M791 Myalgia, unspecified site: Secondary | ICD-10-CM

## 2020-11-15 NOTE — Addendum Note (Signed)
Addended by: Fidel Levy on: 11/15/2020 02:27 PM   Modules accepted: Orders

## 2020-11-15 NOTE — Patient Instructions (Signed)
Medication Instructions:  Your physician recommends that you continue on your current medications as directed. Please refer to the Current Medication list given to you today.  *If you need a refill on your cardiac medications before your next appointment, please call your pharmacy*   Lab Work: LIPID PANEL today   If you have labs (blood work) drawn today and your tests are completely normal, you will receive your results only by: Marland Kitchen MyChart Message (if you have MyChart) OR . A paper copy in the mail If you have any lab test that is abnormal or we need to change your treatment, we will call you to review the results.   Testing/Procedures: NONE   Follow-Up: At [facility], you and your health needs are our priority.  As part of our continuing mission to provide you with exceptional heart care, we have created designated Provider Care Teams.  These Care Teams include your primary Cardiologist (physician) and Advanced Practice Providers (APPs -  Physician Assistants and Nurse Practitioners) who all work together to provide you with the care you need, when you need it.  We recommend signing up for the patient portal called "MyChart".  Sign up information is provided on this After Visit Summary.  MyChart is used to connect with patients for Virtual Visits (Telemedicine).  Patients are able to view lab/test results, encounter notes, upcoming appointments, etc.  Non-urgent messages can be sent to your provider as well.   To learn more about what you can do with MyChart, go to [email or web address].    Your next appointment:   12 month(s)  The format for your next appointment:   In Person  Provider:   Dr. Debara Pickett - lipid clinic   Other Instructions You are due for a follow up with Dr. Percival Spanish 08/2021

## 2020-11-15 NOTE — Progress Notes (Signed)
LIPID CLINIC CONSULT NOTE  Chief Complaint:  Manage dyslipidemia  Primary Care Physician: Bernerd Limbo, MD  Primary Cardiologist:  Minus Breeding, MD  HPI:  Michaela Guerra is a 74 y.o. female who is being seen today for the evaluation of dyslipidemia at the request of Dr. Percival Spanish.  This is a pleasant 74 year old female kindly referred by Dr. Percival Spanish after he saw her for preop visit in February 2021.  She was found to have a thoracic aortic aneurysm which was incidentally noted and treated by Dr. Oneida Alar.  Based on her PAD, her target LDL is less than 70.  Unfortunately she had significant myalgias on statins in the past and cannot tolerate them.  She has been on ezetimibe but her most recent LDL cholesterol was 125, as of May 2020.  She is unfortunately not had repeat blood work since then, partially due to the COVID-19 pandemic.  11/15/2020  Michaela Guerra returns today for follow-up.  She reports she has been taking Repatha.  She was able to get it for I believe no cost due to Medicaid.  She has been taking that shot every 2 weeks but has not yet had reassessment of her lipids.  She is due for that.  She has been struggling with a compression fracture in her back which was not operated.  She did not have a kyphoplasty.  They are talking about possible injections and I would defer to Dr. Percival Spanish as to whether or not she needs to stop her aspirin most likely a week before the procedure.  PMHx:  Past Medical History:  Diagnosis Date  . Allergy   . Aneurysm of aorta ([place]) 12/2019  . Anxiety   . Arthritis   . Bleeding diathesis ([place])    Hx of   . Breast cancer ([place])    Adenocarcinoma, radiation and surgery; at age 68  . Closed fracture of left distal radius   . COLONIC POLYPS, HYPERPLASTIC 07/12/2009   Qualifier: Diagnosis of  By: Amil Amen MD, Benjamine Mola    . Complication of anesthesia    Hard to wake up.  Marland Kitchen COPD (chronic obstructive pulmonary disease) (HCC)    heavy smoker 1ppd  .  CVA (cerebral vascular accident) ([place]) 11/2013   no deficits  . DECREASED HEARING, LEFT EAR 05/03/2010   Qualifier: Diagnosis of  By: Asa Lente MD, Jannifer Rodney   . Depression   . Derangement of anterior horn of lateral meniscus 04/24/2009   Annotation: Underwent right knee arthroscopic surgery 09/20/09: For  chondromalacia and anterolateral meniscectomy Qualifier: Diagnosis of  By: Amil Amen MD, Benjamine Mola    . GERD (gastroesophageal reflux disease)   . Hyperlipidemia   . Hypertension   . Hypothyroidism   . Polio    Dx at age 26 yrs  . Smoker    1ppd  . Thoracic aortic aneurysm (TAA) (HCC)    6.6 cm posterior arotic arch TAA 11/09/19    Past Surgical History:  Procedure Laterality Date  . BREAST SURGERY Left    lumpectomy  . CAROTID-SUBCLAVIAN BYPASS GRAFT Left 12/06/2019   Procedure: BYPASS GRAFT CAROTID-SUBCLAVIAN LEFT;  Surgeon: Elam Dutch, MD;  Location: [facility] OR;  Service: Vascular;  Laterality: Left;  . EMBOLECTOMY Right 12/13/2019   Procedure: EMBOLECTOMY RIGHT SUBCLAVIAN;  Surgeon: Elam Dutch, MD;  Location: [place];  Service: Vascular;  Laterality: Right;  . EYE SURGERY     Bilateral cataracts  . OPEN REDUCTION INTERNAL FIXATION (ORIF) DISTAL RADIAL FRACTURE Left 10/18/2019  Procedure: OPEN REDUCTION INTERNAL FIXATION (ORIF) DISTAL RADIAL FRACTURE;  Surgeon: Milly Jakob, MD;  Location: [place];  Service: Orthopedics;  Laterality: Left;  SURGERY REQUEST TIME: 75 MIN  REGIONAL BLOCK  . THORACIC AORTIC ENDOVASCULAR STENT GRAFT  12/13/2019  . THORACIC AORTIC ENDOVASCULAR STENT GRAFT N/A 12/13/2019   Procedure: THORACIC AORTIC ENDOVASCULAR STENT GRAFT WITH RIGHT ABDOMEN CONDUIT;  Surgeon: Elam Dutch, MD;  Location: [place];  Service: Vascular;  Laterality: N/A;  . TOTAL HIP ARTHROPLASTY Right 10/04/2014   Procedure: TOTAL HIP ARTHROPLASTY ANTERIOR APPROACH;  Surgeon: Mcarthur Rossetti, MD;  Location: [place];  Service: Orthopedics;  Laterality: Right;     FAMHx:  Family History  Problem Relation Age of Onset  . Breast cancer Mother   . Stroke Mother   . Stroke Father   . Breast cancer Sister   . Hypertension Sister   . Hypertension Sister   . Dementia Sister   . Hemophilia Grandson   . Hemophilia Daughter     SOCHx:   reports that she has been smoking. She has a 40.00 pack-year smoking history. She has never used smokeless tobacco. She reports current alcohol use. She reports that she does not use drugs.  ALLERGIES:  Allergies  Allergen Reactions  . Statins Other (See Comments)    Myalgias and fatigue  . Codeine Other (See Comments)    REACTION: causes skin to feel like it's crawling  . Levofloxacin Nausea Only    ROS: Pertinent items noted in HPI and remainder of comprehensive ROS otherwise negative.  HOME MEDS: Current Outpatient Medications on File Prior to Visit  Medication Sig Dispense Refill  . acetaminophen (TYLENOL) 500 MG tablet Take 500 mg by mouth every 6 (six) hours as needed for mild pain, fever or headache.    Marland Kitchen aspirin EC 81 MG tablet Take 81 mg by mouth at bedtime.    . busPIRone (BUSPAR) 7.5 MG tablet Take 7.5 mg by mouth 2 (two) times daily.     . carvedilol (COREG) 12.5 MG tablet Take 12.5 mg by mouth 2 (two) times daily.    Marland Kitchen escitalopram (LEXAPRO) 5 MG tablet Take 5 mg by mouth at bedtime.     . Evolocumab (REPATHA SURECLICK) 161 MG/ML SOAJ Inject 1 Dose into the skin every 14 (fourteen) days. 2.1 mL 11  . ezetimibe (ZETIA) 10 MG tablet Take 10 mg by mouth at bedtime.     . fluticasone (FLONASE) 50 MCG/ACT nasal spray Place 1 spray into both nostrils daily as needed for allergies.     Marland Kitchen levothyroxine (SYNTHROID) 25 MCG tablet Take 25 mcg by mouth daily.    . methocarbamol (ROBAXIN) 500 MG tablet Take 1 tablet (500 mg total) by mouth every 6 (six) hours as needed for muscle spasms. 60 tablet 1  . oxyCODONE-acetaminophen (PERCOCET) 10-325 MG tablet Take 1 tablet by mouth every 6 (six) hours as needed  for pain. 30 tablet 0  . gabapentin (NEURONTIN) 300 MG capsule Take 1 capsule (300 mg total) by mouth at bedtime. (Patient not taking: Reported on 11/15/2020) 60 capsule 1  . [DISCONTINUED] omeprazole (PRILOSEC OTC) 20 MG tablet Take 1 tablet by mouth daily.     No current facility-administered medications on file prior to visit.    LABS/IMAGING: No results found for this or any previous visit (from the past 48 hour(s)). No results found.  LIPID PANEL:    Component Value Date/Time   CHOL 190 08/08/2020 1446   TRIG 183 (  H) 08/08/2020 1446   HDL 44 08/08/2020 1446   CHOLHDL 4.3 08/08/2020 1446   CHOLHDL 3.9 06/30/2015 0833   VLDL 20 06/30/2015 0833   LDLCALC 114 (H) 08/08/2020 1446    WEIGHTS: Wt Readings from Last 3 Encounters:  11/15/20 153 lb 10.1 oz (69.7 kg)  09/04/20 159 lb 2.8 oz (72.2 kg)  08/30/20 159 lb 3.2 oz (72.2 kg)    VITALS: BP (!) 149/87   Pulse 69   Ht 5\' 4"  (1.626 m)   Wt 153 lb 10.1 oz (69.7 kg)   SpO2 94%   BMI 26.37 kg/m   EXAM: Deferred  EKG:  Deferred  ASSESSMENT: 1. Mixed dyslipidemia, goal LDL less than 70 2. Thoracic aortic aneurysm-repaired 3. Hypertension 4. Tobacco abuse 5. Statin intolerant-myalgias  PLAN: 1.   Michaela Guerra has been doing well with Repatha which she gets for free or very little cost due to Medicaid.  She is due for repeat lipids which will assess today.  I may be able to stop her ezetimibe if her LDL is very low.  I expect it might be.  Plan follow-up with me annually or sooner as necessary.  Pixie Casino, MD, [facility], [place] Director of the Advanced Lipid Disorders &  Cardiovascular Risk Reduction Clinic Diplomate of the American Board of Clinical Lipidology Attending Cardiologist  Direct Dial: [phone]  Fax: [phone]  Website:  [email or web address] 11/15/2020, 2:15 PM

## 2020-11-16 LAB — LIPID PANEL
Chol/HDL Ratio: 2.5 ratio (ref 0.0–4.4)
Cholesterol, Total: 115 mg/dL (ref 100–199)
HDL: 46 mg/dL (ref >39)
LDL Chol Calc (NIH): 50 mg/dL (ref 0–99)
Triglycerides: 99 mg/dL (ref 0–149)
VLDL Cholesterol Cal: 19 mg/dL (ref 5–40)

## 2020-11-22 ENCOUNTER — Telehealth: Payer: Self-pay | Admitting: *Deleted

## 2020-11-22 NOTE — Telephone Encounter (Signed)
Michaela Guerra,  Thank you for reaching out. Despite her family history of Hemophilia A the patient did not demonstrate any lab findings consistent with Hemophilia. Her tested levels were within normal limits. She was found to have low Vitamin C (which can impair wound healing) but was otherwise well. From our perspective there is no barrier to proceeding with an epidural injection due to blood clotting concerns.   Best,  Dr. Lorenso Courier

## 2020-11-22 NOTE — Telephone Encounter (Signed)
This RN received call from pt ( remote history of breast cancer ) stating she is seeing Dr Brien Few for back pain with recommendation for " epidural injection " but due to my family history with hemophilia wanted to get ok to do "  Per chart review noted pt was actually seen by Dr Lorenso Courier last year for same issues secondary to proceeding with aneurysm surgery.  This note will be forwarded to Dr Lorenso Courier and his nurse for further review and further follow up per above.  Return call number for Gena verified as [phone].

## 2020-11-27 ENCOUNTER — Encounter: Payer: Self-pay | Admitting: Internal Medicine

## 2020-12-04 ENCOUNTER — Encounter: Payer: Self-pay | Admitting: *Deleted

## 2020-12-07 ENCOUNTER — Telehealth: Payer: Self-pay | Admitting: Orthopaedic Surgery

## 2020-12-07 ENCOUNTER — Other Ambulatory Visit: Payer: Self-pay

## 2020-12-07 DIAGNOSIS — M545 Low back pain, unspecified: Secondary | ICD-10-CM

## 2020-12-07 NOTE — Telephone Encounter (Signed)
Pt daughter called and her and Theta are very concerned with what's going on with her back. She does not like the doctors that Dr.blackman suggested for her. She wanted to get an injection and they told her she couldn't. Please give her a call [phone]

## 2020-12-07 NOTE — Telephone Encounter (Signed)
Left VM for patient asking her to call back and let us know what doc she saw because I don't see anything in her chart

## 2020-12-07 NOTE — Telephone Encounter (Signed)
I am not sure how to address this.  I am not a spine specialist and we sent her to spine specialist given the compression fracture in her lumbar spine.  I have to defer to their expertise.  Find out who she went to.  Then we can always make a referral to a different spine specialist such as Dr. Sharolyn Douglas of spine and scoliosis specialist who also has the expertise in injections in the back as well.  There is really nothing else I can recommend for her.

## 2020-12-12 ENCOUNTER — Telehealth: Payer: Self-pay | Admitting: Internal Medicine

## 2020-12-12 NOTE — Telephone Encounter (Signed)
Called patient, advised I could send message to MD to advise if she can stop medication since she got a good report on her blood work. Patient verbalized understanding, will await response.

## 2020-12-12 NOTE — Telephone Encounter (Signed)
New Message:      Pt said she had good lab results. She said Dr Michaela Guerra had said once that if her lab results were good, she might could stop taking Ezetimibe. She wants to know if she can stop taking it now?

## 2020-12-15 NOTE — Telephone Encounter (Signed)
Patient called with MD advice. Voiced understanding.  ?

## 2020-12-15 NOTE — Telephone Encounter (Signed)
Cholesterol is excellent - would advise continuing zetia - I would only stop it if the LDL was <25, she is at 50, which is perfect!  Dr Lemmie Evens

## 2020-12-25 ENCOUNTER — Ambulatory Visit: Payer: Medicare Other | Admitting: Orthopaedic Surgery

## 2021-01-08 ENCOUNTER — Telehealth: Payer: Self-pay | Admitting: Internal Medicine

## 2021-01-08 NOTE — Telephone Encounter (Signed)
Request Reference Number: [id number]. REPATHA SURE INJ 140MG /ML is approved through 12/08/2021

## 2021-01-08 NOTE — Telephone Encounter (Signed)
PA for repatha submitted via CMM (Key: [id number])

## 2021-03-16 ENCOUNTER — Other Ambulatory Visit: Payer: Self-pay | Admitting: Rehabilitation

## 2021-03-16 DIAGNOSIS — M5416 Radiculopathy, lumbar region: Secondary | ICD-10-CM

## 2021-03-30 ENCOUNTER — Other Ambulatory Visit: Payer: Self-pay

## 2021-03-30 ENCOUNTER — Ambulatory Visit
Admission: RE | Admit: 2021-03-30 | Discharge: 2021-03-30 | Disposition: A | Discharge status: Home / Self Care | Payer: Medicare Other | Source: Ambulatory Visit | Attending: Rehabilitation | Admitting: Rehabilitation

## 2021-03-30 DIAGNOSIS — M5416 Radiculopathy, lumbar region: Secondary | ICD-10-CM

## 2021-07-27 ENCOUNTER — Other Ambulatory Visit: Payer: Self-pay | Admitting: Internal Medicine

## 2021-08-16 ENCOUNTER — Other Ambulatory Visit: Payer: Self-pay | Admitting: *Deleted

## 2021-08-16 DIAGNOSIS — I712 Thoracic aortic aneurysm, without rupture, unspecified: Secondary | ICD-10-CM

## 2021-08-27 ENCOUNTER — Ambulatory Visit
Admission: RE | Admit: 2021-08-27 | Discharge: 2021-08-27 | Disposition: A | Discharge status: Home / Self Care | Payer: Medicare Other | Source: Ambulatory Visit | Attending: Vascular Surgery | Admitting: Vascular Surgery

## 2021-08-27 DIAGNOSIS — I712 Thoracic aortic aneurysm, without rupture, unspecified: Secondary | ICD-10-CM

## 2021-08-27 MED ORDER — IOPAMIDOL (ISOVUE-370) INJECTION 76%
75.0000 mL | Freq: Once | INTRAVENOUS | Status: AC | PRN
  Administered 2021-08-27: 75 mL via INTRAVENOUS

## 2021-08-29 ENCOUNTER — Ambulatory Visit (INDEPENDENT_AMBULATORY_CARE_PROVIDER_SITE_OTHER): Payer: Medicare Other | Admitting: Vascular Surgery

## 2021-08-29 ENCOUNTER — Encounter: Payer: Self-pay | Admitting: Vascular Surgery

## 2021-08-29 ENCOUNTER — Other Ambulatory Visit: Payer: Self-pay

## 2021-08-29 VITALS — BP 109/66 | HR 66 | Temp 97.6°F | Resp 20 | Ht 64.0 in | Wt 149.0 lb

## 2021-08-29 DIAGNOSIS — I712 Thoracic aortic aneurysm, without rupture, unspecified: Secondary | ICD-10-CM

## 2021-08-29 NOTE — Progress Notes (Signed)
Patient ID: Michaela Guerra, female   DOB: 24-Jan-1946, 75 y.o.   MRN: [id number]  Reason for Consult: Follow-up   Referred by Bernerd Limbo, MD  Subjective:     HPI:  Michaela Guerra is a 75 y.o. female underwent left carotid to subclavian artery bypass followed by TEVAR in the beginning of 2021.  This was for thoracic aneurysm.  She has no new back or chest pain.  She did have a fracture in her back after a fall this past summer.  She follows up with CT scan today.  She has no left arm symptoms.  She has no complaints related to today's visit.  She currently walks with the help of a walker  Past Medical History:  Diagnosis Date   Allergy    Aneurysm of aorta ([place]) 12/2019   Anxiety    Arthritis    Bleeding diathesis (H[facility])    Hx of    Breast cancer (H[facility])    Adenocarcinoma, radiation and surgery; at age 89   Closed fracture of left distal radius    COLONIC POLYPS, HYPERPLASTIC 07/12/2009   Qualifier: Diagnosis of  By: Amil Amen MD, [place]     Complication of anesthesia    Hard to wake up.   COPD (chronic obstructive pulmonary disease) (HCC)    heavy smoker 1ppd   CVA (cerebral vascular accident) ([place]) 11/2013   no deficits   DECREASED HEARING, LEFT EAR 05/03/2010   Qualifier: Diagnosis of  By: Asa Lente MD, Jannifer Rodney    Depression    Derangement of anterior horn of lateral meniscus 04/24/2009   Annotation: Underwent right knee arthroscopic surgery 09/20/09: For  chondromalacia and anterolateral meniscectomy Qualifier: Diagnosis of  By: Amil Amen MD, Benjamine Mola     GERD (gastroesophageal reflux disease)    Hyperlipidemia    Hypertension    Hypothyroidism    Polio    Dx at age 16 yrs   Smoker    1ppd   Thoracic aortic aneurysm (TAA) ([place])    6.6 cm posterior arotic arch TAA 11/09/19   Family History  Problem Relation Age of Onset   Breast cancer Mother    Stroke Mother    Stroke Father    Breast cancer Sister    Hypertension Sister    Hypertension Sister    Dementia  Sister    Hemophilia Grandson    Hemophilia Daughter    Past Surgical History:  Procedure Laterality Date   BREAST SURGERY Left    lumpectomy   CAROTID-SUBCLAVIAN BYPASS GRAFT Left 12/06/2019   Procedure: BYPASS GRAFT CAROTID-SUBCLAVIAN LEFT;  Surgeon: Elam Dutch, MD;  Location: [place];  Service: Vascular;  Laterality: Left;   EMBOLECTOMY Right 12/13/2019   Procedure: EMBOLECTOMY RIGHT SUBCLAVIAN;  Surgeon: Elam Dutch, MD;  Location: [facility] OR;  Service: Vascular;  Laterality: Right;   EYE SURGERY     Bilateral cataracts   OPEN REDUCTION INTERNAL FIXATION (ORIF) DISTAL RADIAL FRACTURE Left 10/18/2019   Procedure: OPEN REDUCTION INTERNAL FIXATION (ORIF) DISTAL RADIAL FRACTURE;  Surgeon: Milly Jakob, MD;  Location: [place];  Service: Orthopedics;  Laterality: Left;  SURGERY REQUEST TIME: 25 MIN  REGIONAL BLOCK   THORACIC AORTIC ENDOVASCULAR STENT GRAFT  12/13/2019   THORACIC AORTIC ENDOVASCULAR STENT GRAFT N/A 12/13/2019   Procedure: THORACIC AORTIC ENDOVASCULAR STENT GRAFT WITH RIGHT ABDOMEN CONDUIT;  Surgeon: Elam Dutch, MD;  Location: [place];  Service: Vascular;  Laterality: N/A;   TOTAL HIP ARTHROPLASTY Right 10/04/2014  Procedure: TOTAL HIP ARTHROPLASTY ANTERIOR APPROACH;  Surgeon: Mcarthur Rossetti, MD;  Location: [place];  Service: Orthopedics;  Laterality: Right;    Short Social History:  Social History   Tobacco Use   Smoking status: Every Day    Packs/day: 1.00    Years: 40.00    Pack years: 40.00    Types: Cigarettes   Smokeless tobacco: Never   Tobacco comments:    Has cut back to 1 ppd she is aware she needs to quit   Substance Use Topics   Alcohol use: Yes    Alcohol/week: 0.0 standard drinks    Comment: once every two weeks    Allergies  Allergen Reactions   Statins Other (See Comments)    Myalgias and fatigue   Codeine Other (See Comments)    REACTION: causes skin to feel like it's crawling   Levofloxacin Nausea Only     Current Outpatient Medications  Medication Sig Dispense Refill   acetaminophen (TYLENOL) 500 MG tablet Take 500 mg by mouth every 6 (six) hours as needed for mild pain, fever or headache.     aspirin EC 81 MG tablet Take 81 mg by mouth at bedtime.     busPIRone (BUSPAR) 7.5 MG tablet Take 7.5 mg by mouth 2 (two) times daily.      carvedilol (COREG) 12.5 MG tablet Take 12.5 mg by mouth 2 (two) times daily.     escitalopram (LEXAPRO) 5 MG tablet Take 5 mg by mouth at bedtime.      ezetimibe (ZETIA) 10 MG tablet Take 10 mg by mouth at bedtime.      gabapentin (NEURONTIN) 300 MG capsule Take 1 capsule (300 mg total) by mouth at bedtime. 60 capsule 1   levothyroxine (SYNTHROID) 25 MCG tablet Take 25 mcg by mouth daily.     methocarbamol (ROBAXIN) 500 MG tablet Take 1 tablet (500 mg total) by mouth every 6 (six) hours as needed for muscle spasms. 60 tablet 1   REPATHA SURECLICK 580 MG/ML SOAJ INJECT 1 INJECTOR EVERY 14 DAYS 2 mL 11   fluticasone (FLONASE) 50 MCG/ACT nasal spray Place 1 spray into both nostrils daily as needed for allergies.  (Patient not taking: Reported on 08/29/2021)     No current facility-administered medications for this visit.    Review of Systems  Constitutional:  Constitutional negative. HENT: HENT negative.  Eyes: Eyes negative.  Respiratory: Respiratory negative.  Cardiovascular: Cardiovascular negative.  GI: Gastrointestinal negative.  Musculoskeletal: Musculoskeletal negative.  Skin: Skin negative.  Neurological: Neurological negative. Hematologic: Hematologic/lymphatic negative.  Psychiatric: Psychiatric negative.       Objective:  Objective   Vitals:   08/29/21 1511  BP: 109/66  Pulse: 66  Resp: 20  Temp: 97.6 F (36.4 C)  SpO2: 93%     Physical Exam HENT:     Head: Normocephalic.     Nose:     Comments: Wearing a mask Eyes:     Pupils: Pupils are equal, round, and reactive to light.  Cardiovascular:     Pulses:          Radial  pulses are 2+ on the right side and 2+ on the left side.  Pulmonary:     Effort: Pulmonary effort is normal.  Abdominal:     General: Abdomen is flat.  Musculoskeletal:        General: Normal range of motion.     Right lower leg: No edema.     Left lower leg: No edema.  Skin:    General: Skin is warm.     Capillary Refill: Capillary refill takes less than 2 seconds.  Neurological:     General: No focal deficit present.     Mental Status: She is alert.  Psychiatric:        Mood and Affect: Mood normal.        Behavior: Behavior normal.        Thought Content: Thought content normal.    Data: CTA IMPRESSION: Since CTA CAP dated 07/19/2020;   1. Stable changes of TEVAR and LEFT carotid to subclavian bypass without evidence of endoleak and patent anastomosis, respectively. 2. Obstructive lung changes and mild sigmoid diverticulosis. Additional chronic and senescent findings as above. Aortic Atherosclerosis (ICD10-I70.0) and Emphysema (ICD10-J43.9).     Assessment/Plan:     75 year old female with above-noted procedure.  We reviewed her CT scan today which appears satisfactory.  She will follow-up in 1 year with repeat CT scan.  All questions were answered to their satisfaction.    Waynetta Sandy MD [facility]

## 2021-09-18 ENCOUNTER — Other Ambulatory Visit: Payer: Self-pay | Admitting: *Deleted

## 2021-09-18 DIAGNOSIS — E785 Hyperlipidemia, unspecified: Secondary | ICD-10-CM

## 2021-09-18 DIAGNOSIS — I10 Essential (primary) hypertension: Secondary | ICD-10-CM

## 2021-09-18 DIAGNOSIS — T466X5A Adverse effect of antihyperlipidemic and antiarteriosclerotic drugs, initial encounter: Secondary | ICD-10-CM

## 2021-09-18 DIAGNOSIS — I712 Thoracic aortic aneurysm, without rupture, unspecified: Secondary | ICD-10-CM

## 2021-10-11 NOTE — Progress Notes (Signed)
Cardiology Office Note   Date:  10/12/2021   ID:  Michaela Guerra, DOB 06/03/1946, MRN [id number]  PCP:  Bernerd Limbo, MD  Cardiologist:   Minus Breeding, MD   Chief Complaint  Patient presents with   Thoracic Aortic Aneurysm       History of Present Illness: Michaela Guerra is a 75 y.o. female who presents for evaluation of chest pain.  He had minimal plaque on cardiac catheterization in 2007. In 2018 she had a had burning sensation in the chest.  Myoview was recommended and was done on 04/24/2017 which showed EF 61%, no evidence of prior infarct or ischemia.   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    The patient had placement of 37 x 37 x 15 cm Gore thoracic aneurysm stent graft with coverage of the left subclavian artery.  She had placement of 28 x 28 x 10 cm Gore thoracic aneurysm stent graft without coverage of the left subclavian artery.  She had coil embolization.    Since I last saw her she has had no new cardiovascular complaints.  She fell and injured her back.  She had lots of problems with back pain.  Her daughter thinks she was addicted to pain medicines for little while and they got her off of this.  We are trying to manage this conservatively and not do surgery.  There is a family history of hemophilia.  The patient did see [facility] and have follow-up CT in September that demonstrated stable aneurysm repair.  She has had a cough which she feels is related to sinus drainage.  Otherwise she has had no other chest complaints.  She is not having any fevers or chills.  She is not having any new shortness of breath, PND or orthopnea.  She is had no new palpitations, presyncope or syncope.  She does get around slowly with a walker.  Past Medical History:  Diagnosis Date   Allergy    Aneurysm of aorta ([place]) 12/2019   Anxiety    Arthritis    Bleeding diathesis (HCC)    Hx of    Breast cancer (HCC)    Adenocarcinoma, radiation and  surgery; at age 75   Closed fracture of left distal radius    COLONIC POLYPS, HYPERPLASTIC 07/12/2009   Qualifier: Diagnosis of  By: Amil Amen MD, Elizabeth     Complication of anesthesia    Hard to wake up.   COPD (chronic obstructive pulmonary disease) (HCC)    heavy smoker 1ppd   CVA (cerebral vascular accident) ([place]) 11/2013   no deficits   DECREASED HEARING, LEFT EAR 05/03/2010   Qualifier: Diagnosis of  By: Asa Lente MD, Jannifer Rodney    Depression    Derangement of anterior horn of lateral meniscus 04/24/2009   Annotation: Underwent right knee arthroscopic surgery 09/20/09: For  chondromalacia and anterolateral meniscectomy Qualifier: Diagnosis of  By: Amil Amen MD, Benjamine Mola     GERD (gastroesophageal reflux disease)    Hyperlipidemia    Hypertension    Hypothyroidism    Polio    Dx at age 63 yrs   Smoker    1ppd   Thoracic aortic aneurysm (TAA)    6.6 cm posterior arotic arch TAA 11/09/19    Past Surgical History:  Procedure Laterality Date   BREAST SURGERY Left    lumpectomy   CAROTID-SUBCLAVIAN BYPASS GRAFT Left 12/06/2019   Procedure: BYPASS GRAFT CAROTID-SUBCLAVIAN LEFT;  Surgeon: Ruta Hinds  E, MD;  Location: [facility];  Service: Vascular;  Laterality: Left;   EMBOLECTOMY Right 12/13/2019   Procedure: EMBOLECTOMY RIGHT SUBCLAVIAN;  Surgeon: Elam Dutch, MD;  Location: [facility] OR;  Service: Vascular;  Laterality: Right;   EYE SURGERY     Bilateral cataracts   OPEN REDUCTION INTERNAL FIXATION (ORIF) DISTAL RADIAL FRACTURE Left 10/18/2019   Procedure: OPEN REDUCTION INTERNAL FIXATION (ORIF) DISTAL RADIAL FRACTURE;  Surgeon: Milly Jakob, MD;  Location: [facility];  Service: Orthopedics;  Laterality: Left;  SURGERY REQUEST TIME: 75 MIN  REGIONAL BLOCK   THORACIC AORTIC ENDOVASCULAR STENT GRAFT  12/13/2019   THORACIC AORTIC ENDOVASCULAR STENT GRAFT N/A 12/13/2019   Procedure: THORACIC AORTIC ENDOVASCULAR STENT GRAFT WITH RIGHT ABDOMEN CONDUIT;  Surgeon: Elam Dutch, MD;  Location: [facility];  Service: Vascular;  Laterality: N/A;   TOTAL HIP ARTHROPLASTY Right 10/04/2014   Procedure: TOTAL HIP ARTHROPLASTY ANTERIOR APPROACH;  Surgeon: Mcarthur Rossetti, MD;  Location: [facility];  Service: Orthopedics;  Laterality: Right;     Current Outpatient Medications  Medication Sig Dispense Refill   acetaminophen (TYLENOL) 500 MG tablet Take 500 mg by mouth every 6 (six) hours as needed for mild pain, fever or headache.     alendronate (FOSAMAX) 70 MG tablet Take 70 mg by mouth once a week.     aspirin EC 81 MG tablet Take 81 mg by mouth at bedtime.     busPIRone (BUSPAR) 15 MG tablet TAKE 1 TABLET(15 MG) BY MOUTH TWICE DAILY     carvedilol (COREG) 12.5 MG tablet Take 12.5 mg by mouth 2 (two) times daily.     escitalopram (LEXAPRO) 10 MG tablet TAKE 1 TABLET(10 MG) BY MOUTH DAILY     ezetimibe (ZETIA) 10 MG tablet Take 10 mg by mouth at bedtime.      fluticasone (FLONASE) 50 MCG/ACT nasal spray Place 1 spray into both nostrils daily as needed for allergies.     losartan-hydrochlorothiazide (HYZAAR) 50-12.5 MG tablet Take 1 tablet by mouth daily.     metFORMIN (GLUCOPHAGE-XR) 500 MG 24 hr tablet TAKE 1 TABLET(500 MG) BY MOUTH WITH BREAKFAST     REPATHA SURECLICK 161 MG/ML SOAJ INJECT 1 INJECTOR EVERY 14 DAYS 2 mL 11   gabapentin (NEURONTIN) 300 MG capsule Take 1 capsule (300 mg total) by mouth at bedtime. (Patient not taking: Reported on 10/12/2021) 60 capsule 1   levothyroxine (SYNTHROID) 25 MCG tablet Take 25 mcg by mouth daily. (Patient not taking: Reported on 10/12/2021)     methocarbamol (ROBAXIN) 500 MG tablet Take 1 tablet (500 mg total) by mouth every 6 (six) hours as needed for muscle spasms. (Patient not taking: Reported on 10/12/2021) 60 tablet 1   No current facility-administered medications for this visit.    Allergies:   Statins, Codeine, and Levofloxacin    ROS:  Please see the history of present illness.   Otherwise, review of systems are  positive for none.   All other systems are reviewed and negative.    PHYSICAL EXAM: VS:  BP 115/72   Pulse 73   Ht 5\' 4"  (1.626 m)   Wt 149 lb 9.6 oz (67.9 kg)   SpO2 96%   BMI 25.68 kg/m  , BMI Body mass index is 25.68 kg/m. GEN:  No distress NECK:  No jugular venous distention at 90 degrees, waveform within normal limits, carotid upstroke brisk and symmetric, no bruits, no thyromegaly LYMPHATICS:  No cervical adenopathy LUNGS:  Clear to auscultation  bilaterally BACK:  No CVA tenderness CHEST:  Unremarkable HEART:  S1 and S2 within normal limits, no S3, no S4, no clicks, no rubs, no murmurs ABD:  Positive bowel sounds normal in frequency in pitch, no bruits, no rebound, no guarding, unable to assess midline mass or bruit with the patient seated. EXT:  2 plus pulses throughout, no edema, no cyanosis no clubbing SKIN:  No rashes no nodules NEURO:  Cranial nerves II through XII grossly intact, motor grossly intact throughout PSYCH:  Cognitively intact, oriented to person place and time   EKG:  EKG is  ordered today. Sinus rhythm, rate 76, first-degree AV block, poor anterior R wave progression, no acute ST-T wave changes.  Recent Labs: No results found for requested labs within last 8760 hours.    Lipid Panel    Component Value Date/Time   CHOL 115 11/15/2020 1432   TRIG 99 11/15/2020 1432   HDL 46 11/15/2020 1432   CHOLHDL 2.5 11/15/2020 1432   CHOLHDL 3.9 06/30/2015 0833   VLDL 20 06/30/2015 0833   LDLCALC 50 11/15/2020 1432      Wt Readings from Last 3 Encounters:  10/12/21 149 lb 9.6 oz (67.9 kg)  08/29/21 149 lb (67.6 kg)  11/15/20 153 lb 10.1 oz (69.7 kg)      Other studies Reviewed: Additional studies/ records that were reviewed today include: CT and [facility] records Review of the above records demonstrates:  Please see elsewhere in the note.     ASSESSMENT AND PLAN:  THORACIC AORTIC ANEURYSM:    This is stable.  We are continuing risk reduction.   HTN:  Her blood pressure is at target.  No change in therapy.   DYSLIPIDEMIA: Her last LDL was 50.  However, she is having trouble with the Repatha injections and so we have the pharmacist working with her.  She has been followed by Dr. Debara Pickett.   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     Current medicines are reviewed at length with the patient today.  The patient does not have concerns regarding medicines.  The following changes have been made:  None  Labs/ tests ordered today include: None  Orders Placed This Encounter  Procedures   EKG 12-Lead      Disposition:   FU with me in one year.      Signed, Minus Breeding, MD  10/12/2021 5:19 PM    [place]

## 2021-10-12 ENCOUNTER — Ambulatory Visit (INDEPENDENT_AMBULATORY_CARE_PROVIDER_SITE_OTHER): Payer: Medicare Other | Admitting: Cardiology

## 2021-10-12 ENCOUNTER — Encounter: Payer: Self-pay | Admitting: Cardiology

## 2021-10-12 ENCOUNTER — Other Ambulatory Visit: Payer: Self-pay

## 2021-10-12 ENCOUNTER — Telehealth: Payer: Self-pay | Admitting: Pharmacist

## 2021-10-12 VITALS — BP 115/72 | HR 73 | Ht 64.0 in | Wt 149.6 lb

## 2021-10-12 DIAGNOSIS — I712 Thoracic aortic aneurysm, without rupture, unspecified: Secondary | ICD-10-CM | POA: Diagnosis not present

## 2021-10-12 DIAGNOSIS — I10 Essential (primary) hypertension: Secondary | ICD-10-CM | POA: Diagnosis not present

## 2021-10-12 DIAGNOSIS — E785 Hyperlipidemia, unspecified: Secondary | ICD-10-CM

## 2021-10-12 DIAGNOSIS — Z720 Tobacco use: Secondary | ICD-10-CM | POA: Diagnosis not present

## 2021-10-12 NOTE — Patient Instructions (Addendum)
Medication Instructions:  Your physician recommends that you continue on your current medications as directed. Please refer to the Current Medication list given to you today.  *If you need a refill on your cardiac medications before your next appointment, please call your pharmacy*  Lab Work: NONE ordered at this time of appointment   If you have labs (blood work) drawn today and your tests are completely normal, you will receive your results only by: [place] (if you have MyChart) OR A paper copy in the mail If you have any lab test that is abnormal or we need to change your treatment, we will call you to review the results.  Testing/Procedures: NONE ordered at this time of appointment   Follow-Up: At [facility], you and your health needs are our priority.  As part of our continuing mission to provide you with exceptional heart care, we have created designated Provider Care Teams.  These Care Teams include your primary Cardiologist (physician) and Advanced Practice Providers (APPs -  Physician Assistants and Nurse Practitioners) who all work together to provide you with the care you need, when you need it.  Your next appointment:   1 year(s)  The format for your next appointment:   In Person  Provider:   Minus Breeding, MD    Other Instructions

## 2021-10-12 NOTE — Telephone Encounter (Signed)
In room consult during appointment with Dr Percival Spanish.  Patient and daughter insist that her Repatha pens are defective.  Used demo pen and appears to know technique, however daughter is wondering if patient is not applying enough pressure.  Is now out of Repatha pens because she has used them all up.  Scheduled appointment for Monday 11/14 to assess patient technique with a sample dose of Repatha.

## 2021-10-22 ENCOUNTER — Ambulatory Visit: Payer: Medicare Other

## 2021-10-23 ENCOUNTER — Ambulatory Visit: Payer: Medicare Other

## 2021-11-14 ENCOUNTER — Ambulatory Visit: Payer: Medicare Other

## 2021-11-16 ENCOUNTER — Telehealth: Payer: Self-pay | Admitting: Internal Medicine

## 2021-11-16 NOTE — Telephone Encounter (Signed)
PA for repatha submitted via CMM (Key: [id number])

## 2021-11-19 NOTE — Telephone Encounter (Signed)
Request Reference Number: [id number]. REPATHA SURE INJ 140MG /ML is approved through 12/08/2022

## 2021-11-21 ENCOUNTER — Other Ambulatory Visit: Payer: Self-pay

## 2021-11-21 ENCOUNTER — Telehealth: Payer: Medicare Other | Admitting: Internal Medicine

## 2021-11-21 NOTE — Telephone Encounter (Signed)
Pt called in stated still having issue [id number]/r140 and I set up a time for her to meet w/dr. Evelene Croon again for demo on 11/23/21 at 3:30pm. Pt voiced understanding

## 2021-12-11 ENCOUNTER — Other Ambulatory Visit: Payer: Self-pay

## 2021-12-11 DIAGNOSIS — I712 Thoracic aortic aneurysm, without rupture, unspecified: Secondary | ICD-10-CM

## 2021-12-12 LAB — BUN+CREAT
BUN/Creatinine Ratio: 23 (ref 12–28)
BUN: 16 mg/dL (ref 8–27)
Creatinine, Ser: 0.69 mg/dL (ref 0.57–1.00)
eGFR: 90 mL/min/{1.73_m2} (ref >59)

## 2021-12-14 ENCOUNTER — Other Ambulatory Visit: Payer: Self-pay

## 2021-12-14 ENCOUNTER — Telehealth (INDEPENDENT_AMBULATORY_CARE_PROVIDER_SITE_OTHER): Payer: Medicare Other | Admitting: Internal Medicine

## 2021-12-14 ENCOUNTER — Encounter: Payer: Self-pay | Admitting: Internal Medicine

## 2021-12-14 DIAGNOSIS — I10 Essential (primary) hypertension: Secondary | ICD-10-CM | POA: Diagnosis not present

## 2021-12-14 DIAGNOSIS — E785 Hyperlipidemia, unspecified: Secondary | ICD-10-CM | POA: Diagnosis not present

## 2021-12-14 DIAGNOSIS — I712 Thoracic aortic aneurysm, without rupture, unspecified: Secondary | ICD-10-CM | POA: Diagnosis not present

## 2021-12-14 DIAGNOSIS — M791 Myalgia, unspecified site: Secondary | ICD-10-CM

## 2021-12-14 DIAGNOSIS — T466X5D Adverse effect of antihyperlipidemic and antiarteriosclerotic drugs, subsequent encounter: Secondary | ICD-10-CM

## 2021-12-14 NOTE — Progress Notes (Signed)
Virtual Visit via Video Note   This visit type was conducted due to national recommendations for restrictions regarding the COVID-19 Pandemic (e.g. social distancing) in an effort to limit this patient's exposure and mitigate transmission in our community.  Due to her co-morbid illnesses, this patient is at least at moderate risk for complications without adequate follow up.  This format is felt to be most appropriate for this patient at this time.  All issues noted in this document were discussed and addressed.  A limited physical exam was performed with this format.  Please refer to the patient's chart for her consent to telehealth for [facility].      Date:  12/14/2021   ID:  Michaela Guerra, DOB [birth year], MRN [id number] The patient was identified using 2 identifiers.  Evaluation Performed:  Follow-Up Visit  Patient Location:  [address] [place] [address]  Provider location:   [address] [place], [place] [address]  PCP:  Bernerd Limbo, MD  Cardiologist:  Minus Breeding, MD Electrophysiologist:  None   Chief Complaint:  Follow-up dyslipidemia  History of Present Illness:    Michaela Guerra is a 76 y.o. female who presents via audio/video conferencing for a telehealth visit today.  This is a pleasant female kindly referred by Dr. Percival Spanish after he saw her for preop visit in February 2021.  She was found to have a thoracic aortic aneurysm which was incidentally noted and treated by Dr. Oneida Alar.  Based on her PAD, her target LDL is less than 70.  Unfortunately she had significant myalgias on statins in the past and cannot tolerate them.  She has been on ezetimibe but her most recent LDL cholesterol was 125, as of May 2020.  She is unfortunately not had repeat blood work since then, partially due to the COVID-19 pandemic  12/14/2021  Michaela Guerra is seen today in video follow-up.  She reports compliance with her Repatha having taken her last shot on the  31st.  She has had trouble getting it from her pharmacy.  She was reauthorize for the medication on December 12 through December 2023.  She needs to just get her medicine renewed however the pharmacy has given her issues with stocking it.  She seems to be tolerating well.  She is due for repeat lipid profile.  For some reason she had labs drawn the other day for repeat CT of her aneurysm which was ordered by Dr. Donzetta Matters however that CT is not due until September 2023.  The patient does not have symptoms concerning for COVID-19 infection (fever, chills, cough, or new SHORTNESS OF BREATH).    Prior CV studies:   The following studies were reviewed today:  Chart reviewed, lab work  PMHx:  Past Medical History:  Diagnosis Date   Allergy    Aneurysm of aorta ([facility]) 12/2019   Anxiety    Arthritis    Bleeding diathesis ([place])    Hx of    Breast cancer (HCC)    Adenocarcinoma, radiation and surgery; at age 85   Closed fracture of left distal radius    COLONIC POLYPS, HYPERPLASTIC 07/12/2009   Qualifier: Diagnosis of  By: Amil Amen MD, Elizabeth     Complication of anesthesia    Hard to wake up.   COPD (chronic obstructive pulmonary disease) (HCC)    heavy smoker 1ppd   CVA (cerebral vascular accident) ([place]) 11/2013   no deficits   DECREASED HEARING, LEFT EAR 05/03/2010  Qualifier: Diagnosis of  By: Asa Lente MD, Jannifer Rodney    Depression    Derangement of anterior horn of lateral meniscus 04/24/2009   Annotation: Underwent right knee arthroscopic surgery 09/20/09: For  chondromalacia and anterolateral meniscectomy Qualifier: Diagnosis of  By: Amil Amen MD, Benjamine Mola     GERD (gastroesophageal reflux disease)    Hyperlipidemia    Hypertension    Hypothyroidism    Polio    Dx at age 66 yrs   Smoker    1ppd   Thoracic aortic aneurysm (TAA)    6.6 cm posterior arotic arch TAA 11/09/19    Past Surgical History:  Procedure Laterality Date   BREAST SURGERY Left    lumpectomy   CAROTID-SUBCLAVIAN  BYPASS GRAFT Left 12/06/2019   Procedure: BYPASS GRAFT CAROTID-SUBCLAVIAN LEFT;  Surgeon: Elam Dutch, MD;  Location: [facility];  Service: Vascular;  Laterality: Left;   EMBOLECTOMY Right 12/13/2019   Procedure: EMBOLECTOMY RIGHT SUBCLAVIAN;  Surgeon: Elam Dutch, MD;  Location: [facility] OR;  Service: Vascular;  Laterality: Right;   EYE SURGERY     Bilateral cataracts   OPEN REDUCTION INTERNAL FIXATION (ORIF) DISTAL RADIAL FRACTURE Left 10/18/2019   Procedure: OPEN REDUCTION INTERNAL FIXATION (ORIF) DISTAL RADIAL FRACTURE;  Surgeon: Milly Jakob, MD;  Location: [facility];  Service: Orthopedics;  Laterality: Left;  SURGERY REQUEST TIME: 48 MIN  REGIONAL BLOCK   THORACIC AORTIC ENDOVASCULAR STENT GRAFT  12/13/2019   THORACIC AORTIC ENDOVASCULAR STENT GRAFT N/A 12/13/2019   Procedure: THORACIC AORTIC ENDOVASCULAR STENT GRAFT WITH RIGHT ABDOMEN CONDUIT;  Surgeon: Elam Dutch, MD;  Location: [facility];  Service: Vascular;  Laterality: N/A;   TOTAL HIP ARTHROPLASTY Right 10/04/2014   Procedure: TOTAL HIP ARTHROPLASTY ANTERIOR APPROACH;  Surgeon: Mcarthur Rossetti, MD;  Location: [facility];  Service: Orthopedics;  Laterality: Right;    FAMHx:  Family History  Problem Relation Age of Onset   Breast cancer Mother    Stroke Mother    Stroke Father    Breast cancer Sister    Hypertension Sister    Hypertension Sister    Dementia Sister    Hemophilia Grandson    Hemophilia Daughter     SOCHx:   reports that she has been smoking cigarettes. She has a 40.00 pack-year smoking history. She has never used smokeless tobacco. She reports current alcohol use. She reports that she does not use drugs.  ALLERGIES:  Allergies  Allergen Reactions   Statins Other (See Comments)    Myalgias and fatigue   Codeine Other (See Comments)    REACTION: causes skin to feel like it's crawling   Levofloxacin Nausea Only    MEDS:  Current Meds  Medication Sig   acetaminophen (TYLENOL) 500  MG tablet Take 500 mg by mouth every 6 (six) hours as needed for mild pain, fever or headache.   alendronate (FOSAMAX) 70 MG tablet Take 70 mg by mouth once a week.   aspirin EC 81 MG tablet Take 81 mg by mouth at bedtime.   busPIRone (BUSPAR) 15 MG tablet TAKE 1 TABLET(15 MG) BY MOUTH TWICE DAILY   carvedilol (COREG) 12.5 MG tablet Take 12.5 mg by mouth 2 (two) times daily.   escitalopram (LEXAPRO) 10 MG tablet TAKE 1 TABLET(10 MG) BY MOUTH DAILY   ezetimibe (ZETIA) 10 MG tablet Take 10 mg by mouth at bedtime.    fluticasone (FLONASE) 50 MCG/ACT nasal spray Place 1 spray into both nostrils daily as needed for allergies.  gabapentin (NEURONTIN) 300 MG capsule Take 1 capsule (300 mg total) by mouth at bedtime.   levothyroxine (SYNTHROID) 25 MCG tablet Take 25 mcg by mouth daily.   losartan-hydrochlorothiazide (HYZAAR) 50-12.5 MG tablet Take 1 tablet by mouth daily.   metFORMIN (GLUCOPHAGE-XR) 500 MG 24 hr tablet TAKE 1 TABLET(500 MG) BY MOUTH WITH BREAKFAST   methocarbamol (ROBAXIN) 500 MG tablet Take 1 tablet (500 mg total) by mouth every 6 (six) hours as needed for muscle spasms.   REPATHA SURECLICK 482 MG/ML SOAJ INJECT 1 INJECTOR EVERY 14 DAYS     ROS: Pertinent items noted in HPI and remainder of comprehensive ROS otherwise negative.  Labs/Other Tests and Data Reviewed:    Recent Labs: 12/11/2021: BUN 16; Creatinine, Ser 0.69   Recent Lipid Panel Lab Results  Component Value Date/Time   CHOL 115 11/15/2020 02:32 PM   TRIG 99 11/15/2020 02:32 PM   HDL 46 11/15/2020 02:32 PM   CHOLHDL 2.5 11/15/2020 02:32 PM   CHOLHDL 3.9 06/30/2015 08:33 AM   LDLCALC 50 11/15/2020 02:32 PM    Wt Readings from Last 3 Encounters:  10/12/21 149 lb 9.6 oz (67.9 kg)  08/29/21 149 lb (67.6 kg)  11/15/20 153 lb 10.1 oz (69.7 kg)     Exam:    Vital Signs:  There were no vitals taken for this visit.   General appearance: alert and no distress Lungs: Some rhinitis, mild cough Abdomen: Normal  weight Extremities: extremities normal, atraumatic, no cyanosis or edema Skin: Skin color, texture, turgor normal. No rashes or lesions Neurologic: Grossly normal Psych: Pleasant  ASSESSMENT & PLAN:    Mixed dyslipidemia, goal LDL less than 70 Thoracic aortic aneurysm-repaired Hypertension Tobacco abuse Statin intolerant-myalgias   Overall Ms. Spiller is doing well on Repatha.  She is due for refill and she was reauthorize for the medication but her pharmacy has not had it in stock.  I have encouraged her to consider other pharmacies if she is having supply chain issues.  I will plan a repeat lipid to make sure she is at target.  Plan otherwise follow-up annually or sooner as necessary.  COVID-19 Education: The signs and symptoms of COVID-19 were discussed with the patient and how to seek care for testing (follow up with PCP or arrange E-visit).  The importance of social distancing was discussed today.  Patient Risk:   After full review of this patients clinical status, I feel that they are at least moderate risk at this time.  Time:   Today, I have spent 15 minutes with the patient with telehealth technology discussing dyslipidemia.     Medication Adjustments/Labs and Tests Ordered: Current medicines are reviewed at length with the patient today.  Concerns regarding medicines are outlined above.   Tests Ordered: No orders of the defined types were placed in this encounter.   Medication Changes: No orders of the defined types were placed in this encounter.   Disposition:  in 1 year(s)  Pixie Casino, MD, [facility], [place] Director of the Advanced Lipid Disorders &  Cardiovascular Risk Reduction Clinic Diplomate of the American Board of Clinical Lipidology Attending Cardiologist  Direct Dial: [phone]   Fax: [phone]  Website:  [email or web address]  Pixie Casino, MD  12/14/2021 9:41 AM

## 2021-12-14 NOTE — Patient Instructions (Signed)
Medication Instructions:  Your physician recommends that you continue on your current medications as directed. Please refer to the Current Medication list given to you today.  *If you need a refill on your cardiac medications before your next appointment, please call your pharmacy*   Lab Work: FASTING lab work to check cholesterol ASAP   FASTING lab work to check cholesterol in 1 year -- complete before next visit with Dr. Debara Pickett   If you have labs (blood work) drawn today and your tests are completely normal, you will receive your results only by: [place] (if you have MyChart) OR A paper copy in the mail If you have any lab test that is abnormal or we need to change your treatment, we will call you to review the results.   Testing/Procedures: NONE   Follow-Up: At [facility], you and your health needs are our priority.  As part of our continuing mission to provide you with exceptional heart care, we have created designated Provider Care Teams.  These Care Teams include your primary Cardiologist (physician) and Advanced Practice Providers (APPs -  Physician Assistants and Nurse Practitioners) who all work together to provide you with the care you need, when you need it.  We recommend signing up for the patient portal called "MyChart".  Sign up information is provided on this After Visit Summary.  MyChart is used to connect with patients for Virtual Visits (Telemedicine).  Patients are able to view lab/test results, encounter notes, upcoming appointments, etc.  Non-urgent messages can be sent to your provider as well.   To learn more about what you can do with MyChart, go to [email or web address].    Your next appointment:   1 year with Dr. Debara Pickett -- lipid clinic

## 2021-12-22 LAB — LIPID PANEL
Chol/HDL Ratio: 2.9 ratio (ref 0.0–4.4)
Cholesterol, Total: 143 mg/dL (ref 100–199)
HDL: 50 mg/dL (ref >39)
LDL Chol Calc (NIH): 76 mg/dL (ref 0–99)
Triglycerides: 87 mg/dL (ref 0–149)
VLDL Cholesterol Cal: 17 mg/dL (ref 5–40)

## 2021-12-25 ENCOUNTER — Encounter: Payer: Self-pay | Admitting: Internal Medicine

## 2022-07-19 ENCOUNTER — Other Ambulatory Visit: Payer: Self-pay | Admitting: Internal Medicine

## 2022-07-19 DIAGNOSIS — T466X5A Adverse effect of antihyperlipidemic and antiarteriosclerotic drugs, initial encounter: Secondary | ICD-10-CM

## 2022-07-19 DIAGNOSIS — I712 Thoracic aortic aneurysm, without rupture, unspecified: Secondary | ICD-10-CM

## 2022-07-19 DIAGNOSIS — E785 Hyperlipidemia, unspecified: Secondary | ICD-10-CM

## 2022-08-23 ENCOUNTER — Other Ambulatory Visit: Payer: Self-pay

## 2022-08-23 DIAGNOSIS — I712 Thoracic aortic aneurysm, without rupture, unspecified: Secondary | ICD-10-CM

## 2022-09-11 ENCOUNTER — Ambulatory Visit (HOSPITAL_COMMUNITY)
Admission: RE | Admit: 2022-09-11 | Discharge: 2022-09-11 | Disposition: A | Discharge status: Home / Self Care | Payer: Medicare Other | Source: Ambulatory Visit | Attending: Vascular Surgery | Admitting: Vascular Surgery

## 2022-09-11 DIAGNOSIS — E139 Other specified diabetes mellitus without complications: Secondary | ICD-10-CM | POA: Diagnosis present

## 2022-09-11 DIAGNOSIS — I712 Thoracic aortic aneurysm, without rupture, unspecified: Secondary | ICD-10-CM | POA: Diagnosis not present

## 2022-09-11 LAB — POCT I-STAT CREATININE: Creatinine, Ser: 0.8 mg/dL (ref 0.44–1.00)

## 2022-09-11 MED ORDER — IOHEXOL 350 MG/ML SOLN
100.0000 mL | Freq: Once | INTRAVENOUS | Status: AC | PRN
  Administered 2022-09-11: 100 mL via INTRAVENOUS

## 2022-09-24 ENCOUNTER — Other Ambulatory Visit: Payer: Self-pay | Admitting: Physician Assistant

## 2022-09-24 ENCOUNTER — Other Ambulatory Visit (HOSPITAL_COMMUNITY): Payer: Self-pay | Admitting: Physician Assistant

## 2022-09-24 DIAGNOSIS — K219 Gastro-esophageal reflux disease without esophagitis: Secondary | ICD-10-CM

## 2022-09-24 DIAGNOSIS — R109 Unspecified abdominal pain: Secondary | ICD-10-CM

## 2022-10-02 ENCOUNTER — Encounter: Payer: Self-pay | Admitting: Vascular Surgery

## 2022-10-02 ENCOUNTER — Ambulatory Visit (INDEPENDENT_AMBULATORY_CARE_PROVIDER_SITE_OTHER): Payer: Medicare Other | Admitting: Vascular Surgery

## 2022-10-02 VITALS — BP 125/67 | HR 73 | Temp 98.1°F | Resp 20 | Ht 64.0 in | Wt 150.0 lb

## 2022-10-02 DIAGNOSIS — I712 Thoracic aortic aneurysm, without rupture, unspecified: Secondary | ICD-10-CM | POA: Diagnosis not present

## 2022-10-02 NOTE — Progress Notes (Signed)
Patient ID: Michaela Guerra, female   DOB: 25-Apr-1946, 76 y.o.   MRN: [id number]  Reason for Consult: Follow-up   Referred by Bernerd Limbo, MD  Subjective:     HPI:  Michaela Guerra is a 76 y.o. female history of left carotid to subclavian artery bypass and subsequent thoracic endovascular aneurysm repair.  She follows up today with CT scan.  She does have back pain from her fracture but no new back pain no abdominal pain.  Currently walking.  Past Medical History:  Diagnosis Date   Allergy    Aneurysm of aorta ([place]) 12/2019   Anxiety    Arthritis    Bleeding diathesis (HCC)    Hx of    Breast cancer (HCC)    Adenocarcinoma, radiation and surgery; at age 32   Closed fracture of left distal radius    COLONIC POLYPS, HYPERPLASTIC 07/12/2009   Qualifier: Diagnosis of  By: Amil Amen MD, Elizabeth     Complication of anesthesia    Hard to wake up.   COPD (chronic obstructive pulmonary disease) (HCC)    heavy smoker 1ppd   CVA (cerebral vascular accident) ([place]) 11/2013   no deficits   DECREASED HEARING, LEFT EAR 05/03/2010   Qualifier: Diagnosis of  By: Asa Lente MD, Jannifer Rodney    Depression    Derangement of anterior horn of lateral meniscus 04/24/2009   Annotation: Underwent right knee arthroscopic surgery 09/20/09: For  chondromalacia and anterolateral meniscectomy Qualifier: Diagnosis of  By: Amil Amen MD, Benjamine Mola     GERD (gastroesophageal reflux disease)    Hyperlipidemia    Hypertension    Hypothyroidism    Polio    Dx at age 69 yrs   Smoker    1ppd   Thoracic aortic aneurysm (TAA) ([place])    6.6 cm posterior arotic arch TAA 11/09/19   Family History  Problem Relation Age of Onset   Breast cancer Mother    Stroke Mother    Stroke Father    Breast cancer Sister    Hypertension Sister    Hypertension Sister    Dementia Sister    Hemophilia Grandson    Hemophilia Daughter    Past Surgical History:  Procedure Laterality Date   BREAST SURGERY Left    lumpectomy    CAROTID-SUBCLAVIAN BYPASS GRAFT Left 12/06/2019   Procedure: BYPASS GRAFT CAROTID-SUBCLAVIAN LEFT;  Surgeon: Elam Dutch, MD;  Location: [facility];  Service: Vascular;  Laterality: Left;   EMBOLECTOMY Right 12/13/2019   Procedure: EMBOLECTOMY RIGHT SUBCLAVIAN;  Surgeon: Elam Dutch, MD;  Location: [facility];  Service: Vascular;  Laterality: Right;   EYE SURGERY     Bilateral cataracts   OPEN REDUCTION INTERNAL FIXATION (ORIF) DISTAL RADIAL FRACTURE Left 10/18/2019   Procedure: OPEN REDUCTION INTERNAL FIXATION (ORIF) DISTAL RADIAL FRACTURE;  Surgeon: Milly Jakob, MD;  Location: [facility];  Service: Orthopedics;  Laterality: Left;  SURGERY REQUEST TIME: 21 MIN  REGIONAL BLOCK   THORACIC AORTIC ENDOVASCULAR STENT GRAFT  12/13/2019   THORACIC AORTIC ENDOVASCULAR STENT GRAFT N/A 12/13/2019   Procedure: THORACIC AORTIC ENDOVASCULAR STENT GRAFT WITH RIGHT ABDOMEN CONDUIT;  Surgeon: Elam Dutch, MD;  Location: [facility];  Service: Vascular;  Laterality: N/A;   TOTAL HIP ARTHROPLASTY Right 10/04/2014   Procedure: TOTAL HIP ARTHROPLASTY ANTERIOR APPROACH;  Surgeon: Mcarthur Rossetti, MD;  Location: [facility];  Service: Orthopedics;  Laterality: Right;    Short Social History:  Social History   Tobacco Use  Smoking status: Every Day    Packs/day: 1.00    Years: 40.00    Total pack years: 40.00    Types: Cigarettes   Smokeless tobacco: Never   Tobacco comments:    Has cut back to 1 ppd she is aware she needs to quit   Substance Use Topics   Alcohol use: Yes    Alcohol/week: 0.0 standard drinks of alcohol    Comment: once every two weeks    Allergies  Allergen Reactions   Statins Other (See Comments)    Myalgias and fatigue   Codeine Other (See Comments)    REACTION: causes skin to feel like it's crawling   Levofloxacin Nausea Only    Current Outpatient Medications  Medication Sig Dispense Refill   acetaminophen (TYLENOL) 500 MG tablet Take 500 mg by mouth every  6 (six) hours as needed for mild pain, fever or headache.     alendronate (FOSAMAX) 70 MG tablet Take 70 mg by mouth once a week.     aspirin EC 81 MG tablet Take 81 mg by mouth at bedtime.     busPIRone (BUSPAR) 15 MG tablet TAKE 1 TABLET(15 MG) BY MOUTH TWICE DAILY     carvedilol (COREG) 12.5 MG tablet Take 12.5 mg by mouth 2 (two) times daily.     escitalopram (LEXAPRO) 10 MG tablet TAKE 1 TABLET(10 MG) BY MOUTH DAILY     Evolocumab (REPATHA SURECLICK) 619 MG/ML SOAJ INJECT 1 INJECTOR EVERY 14 DAYS AS DIRECTED 6 mL 0   ezetimibe (ZETIA) 10 MG tablet Take 10 mg by mouth at bedtime.      fluticasone (FLONASE) 50 MCG/ACT nasal spray Place 1 spray into both nostrils daily as needed for allergies.     gabapentin (NEURONTIN) 300 MG capsule Take 1 capsule (300 mg total) by mouth at bedtime. 60 capsule 1   levothyroxine (SYNTHROID) 25 MCG tablet Take 25 mcg by mouth daily.     losartan-hydrochlorothiazide (HYZAAR) 50-12.5 MG tablet Take 1 tablet by mouth daily.     metFORMIN (GLUCOPHAGE-XR) 500 MG 24 hr tablet TAKE 1 TABLET(500 MG) BY MOUTH WITH BREAKFAST     methocarbamol (ROBAXIN) 500 MG tablet Take 1 tablet (500 mg total) by mouth every 6 (six) hours as needed for muscle spasms. 60 tablet 1   No current facility-administered medications for this visit.    Review of Systems  Constitutional:  Constitutional negative. HENT: HENT negative.  Eyes: Eyes negative.  Respiratory: Respiratory negative.  Cardiovascular: Cardiovascular negative.  GI: Gastrointestinal negative.  Musculoskeletal: Positive for back pain and gait problem.  Skin: Skin negative.  Hematologic: Hematologic/lymphatic negative.  Psychiatric: Psychiatric negative.        Objective:  Objective   Vitals:   10/02/22 1508  BP: 125/67  Pulse: 73  Resp: 20  Temp: 98.1 F (36.7 C)  SpO2: 90%  Weight: 150 lb (68 kg)  Height: '5\' 4"'$  (1.626 m)   Body mass index is 25.75 kg/m.  Physical Exam HENT:     Head:  Normocephalic.     Nose: Nose normal.  Eyes:     Pupils: Pupils are equal, round, and reactive to light.  Cardiovascular:     Pulses:          Radial pulses are 2+ on the right side and 2+ on the left side.  Pulmonary:     Effort: Pulmonary effort is normal.  Abdominal:     General: Abdomen is flat.     Palpations: Abdomen is  soft.  Musculoskeletal:        General: Normal range of motion.     Right lower leg: No edema.     Left lower leg: No edema.  Skin:    General: Skin is warm and dry.     Capillary Refill: Capillary refill takes less than 2 seconds.  Neurological:     General: No focal deficit present.     Mental Status: She is alert.  Psychiatric:        Mood and Affect: Mood normal.        Thought Content: Thought content normal.     Data: CT IMPRESSION: VASCULAR   1. Similar appearing postsurgical changes after 2 piece descending thoracic aortic endograft repair and left common carotid to left subclavian artery bypass. No complicating features. 2.  Aortic Atherosclerosis (ICD10-I70.0).   NON-VASCULAR   1. Similar appearing emphysema (ICD10-J43.9). 2. Similar appearing sigmoid diverticulosis. 3. Similar appearing changes after L4 vertebral body cement augmentation.     Assessment/Plan:   76 year old female status post repair of thoracic endovascular aneurysm repair and left carotid to subclavian bypass with embolization of the proximal subclavian artery with stable postoperative changes by CT today.  We will have her follow-up in 1 year with repeat CT angio.     Waynetta Sandy MD [facility]

## 2022-10-03 ENCOUNTER — Ambulatory Visit
Admission: RE | Admit: 2022-10-03 | Discharge: 2022-10-03 | Disposition: A | Discharge status: Home / Self Care | Payer: Medicare Other | Source: Ambulatory Visit | Attending: Physician Assistant | Admitting: Physician Assistant

## 2022-10-03 DIAGNOSIS — K219 Gastro-esophageal reflux disease without esophagitis: Secondary | ICD-10-CM

## 2022-10-16 ENCOUNTER — Other Ambulatory Visit: Payer: Self-pay | Admitting: Internal Medicine

## 2022-10-16 DIAGNOSIS — E785 Hyperlipidemia, unspecified: Secondary | ICD-10-CM

## 2022-10-29 ENCOUNTER — Other Ambulatory Visit: Payer: Medicare Other

## 2022-11-11 ENCOUNTER — Other Ambulatory Visit: Payer: Self-pay | Admitting: Internal Medicine

## 2022-11-11 DIAGNOSIS — M791 Myalgia, unspecified site: Secondary | ICD-10-CM

## 2022-11-11 DIAGNOSIS — E785 Hyperlipidemia, unspecified: Secondary | ICD-10-CM

## 2022-11-11 DIAGNOSIS — I712 Thoracic aortic aneurysm, without rupture, unspecified: Secondary | ICD-10-CM

## 2022-11-26 ENCOUNTER — Telehealth: Payer: Self-pay | Admitting: Internal Medicine

## 2022-11-26 NOTE — Telephone Encounter (Signed)
PA for repatha submitted via CMM (Key: [id number])

## 2022-11-28 ENCOUNTER — Ambulatory Visit
Admission: RE | Admit: 2022-11-28 | Discharge: 2022-11-28 | Disposition: A | Discharge status: Home / Self Care | Payer: Medicare Other | Source: Ambulatory Visit | Attending: Physician Assistant | Admitting: Physician Assistant

## 2022-11-28 DIAGNOSIS — R109 Unspecified abdominal pain: Secondary | ICD-10-CM

## 2022-11-28 MED ORDER — IOPAMIDOL (ISOVUE-300) INJECTION 61%
100.0000 mL | Freq: Once | INTRAVENOUS | Status: AC | PRN
  Administered 2022-11-28: 100 mL via INTRAVENOUS

## 2022-11-28 NOTE — Telephone Encounter (Signed)
Request Reference Number: [id number]. REPATHA SURE INJ '140MG'$ /ML is approved through 12/09/2023. Your patient may now fill this prescription and it will be covered.

## 2022-12-06 ENCOUNTER — Other Ambulatory Visit: Payer: Self-pay | Admitting: Internal Medicine

## 2022-12-06 DIAGNOSIS — E785 Hyperlipidemia, unspecified: Secondary | ICD-10-CM

## 2022-12-06 DIAGNOSIS — M791 Myalgia, unspecified site: Secondary | ICD-10-CM

## 2022-12-06 DIAGNOSIS — I712 Thoracic aortic aneurysm, without rupture, unspecified: Secondary | ICD-10-CM

## 2022-12-06 NOTE — Telephone Encounter (Signed)
Forwarded to Pharm[employer] Pool 

## 2023-04-30 ENCOUNTER — Other Ambulatory Visit: Payer: Self-pay

## 2023-04-30 ENCOUNTER — Other Ambulatory Visit (INDEPENDENT_AMBULATORY_CARE_PROVIDER_SITE_OTHER): Payer: 59

## 2023-04-30 ENCOUNTER — Ambulatory Visit (INDEPENDENT_AMBULATORY_CARE_PROVIDER_SITE_OTHER): Payer: 59 | Admitting: Orthopaedic Surgery

## 2023-04-30 VITALS — Ht 64.0 in | Wt 150.0 lb

## 2023-04-30 DIAGNOSIS — G8929 Other chronic pain: Secondary | ICD-10-CM

## 2023-04-30 DIAGNOSIS — M25552 Pain in left hip: Secondary | ICD-10-CM | POA: Diagnosis not present

## 2023-04-30 DIAGNOSIS — Z96641 Presence of right artificial hip joint: Secondary | ICD-10-CM | POA: Diagnosis not present

## 2023-04-30 DIAGNOSIS — M5442 Lumbago with sciatica, left side: Secondary | ICD-10-CM | POA: Diagnosis not present

## 2023-04-30 NOTE — Progress Notes (Signed)
The patient is a 77 year old female that I have seen in the past.  We actually replaced her right hip in 2015.  She comes in with hip pain but she points to sciatic area of the source of her pain on the left side.  She does have a history of a compression fracture lower lumbar spine that did require cement to be placed in that vertebral body.  That was done by spine specialist.  She denies any groin pain.  She says the left hip has been hurting her but again she points the sciatic region and goes down her hamstring area to her foot.  She is diabetic but has had pretty good control of her diabetes.  I do not see where she is on any blood thinning medication.  Both hips move smoothly and fluidly with no blocks to rotation and no pain in the groin at all.  An AP pelvis and lateral of the left hip shows a well-seated right total hip arthroplasty.  The left hip appears normal in terms of the joint space and no acute findings.  On exam she definitely hurts in the sciatic region on the lower side the left side with a positive straight leg raise.  She walks more lean forward as well.  At this point we need to obtain an MRI of her lumbar spine to rule out nerve compression so we can assess where best to consider an intervention for her symptoms.  This is getting worse for her so an MRI is appropriate at this standpoint.

## 2023-05-09 ENCOUNTER — Ambulatory Visit
Admission: RE | Admit: 2023-05-09 | Discharge: 2023-05-09 | Disposition: A | Discharge status: Home / Self Care | Payer: 59 | Source: Ambulatory Visit | Attending: Orthopaedic Surgery | Admitting: Orthopaedic Surgery

## 2023-05-09 DIAGNOSIS — G8929 Other chronic pain: Secondary | ICD-10-CM

## 2023-06-02 ENCOUNTER — Encounter: Payer: Self-pay | Admitting: Physician Assistant

## 2023-06-02 ENCOUNTER — Ambulatory Visit (INDEPENDENT_AMBULATORY_CARE_PROVIDER_SITE_OTHER): Payer: 59 | Admitting: Physician Assistant

## 2023-06-02 ENCOUNTER — Ambulatory Visit: Payer: 59 | Admitting: Physician Assistant

## 2023-06-02 DIAGNOSIS — M5442 Lumbago with sciatica, left side: Secondary | ICD-10-CM

## 2023-06-02 DIAGNOSIS — G8929 Other chronic pain: Secondary | ICD-10-CM

## 2023-06-02 NOTE — Progress Notes (Signed)
HPI: Mrs. Summerall returns today for follow-up of her low back pain.  She states her pain is still 10 out of 10 pain.  She has left lower leg pain that radiates into her left foot.  Pain is worse with standing.  She does have tingling sensation in her left hip feels like the hip may be out of joint.  She is taking Advil for pain.  She is also on gabapentin and has Robaxin.  MRI of the lumbar spine: Images reviewed with the patient.  MRI shows L1-L2 broad-based disc bulge with small central disc protrusion.  Mild facet arthropathy.  No stenosis foraminal or central canal.  L2-3 mild broad-based disc bulge.  Moderate right and mild left facet arthropathy and no stenosis at this level.  L3-4 mild broad disc bulge with small central disc protrusion and inferior migration of disc material.  Severe bilateral facet arthropathy.  Mild to moderate spinal stenosis.  L4-5 moderate left and mild right foraminal stenosis.  Broad-based disc bulge with broad shallow left paracentral disc protrusion.  Subarticular recess stenosis bilaterally.  L5-S1 no stenosis.  Physical exam: Positive straight raise on the left negative on the right.   Impression: Low back pain with left sided sciatica  Plan: Will send her for an epidural steroid injection lumbar spine.  Have her follow-up after the epidural steroid injections approximately 2 weeks.  Questions were encouraged and answered at length

## 2023-06-03 ENCOUNTER — Other Ambulatory Visit: Payer: Self-pay

## 2023-06-03 DIAGNOSIS — G8929 Other chronic pain: Secondary | ICD-10-CM

## 2023-06-25 ENCOUNTER — Other Ambulatory Visit: Payer: Self-pay

## 2023-06-25 ENCOUNTER — Ambulatory Visit (INDEPENDENT_AMBULATORY_CARE_PROVIDER_SITE_OTHER): Payer: 59 | Admitting: Physical Medicine and Rehabilitation

## 2023-06-25 VITALS — BP 106/52 | HR 70

## 2023-06-25 DIAGNOSIS — M5416 Radiculopathy, lumbar region: Secondary | ICD-10-CM

## 2023-06-25 MED ORDER — METHYLPREDNISOLONE ACETATE 80 MG/ML IJ SUSP
80.0000 mg | Freq: Once | INTRAMUSCULAR | Status: AC
  Administered 2023-06-25: 80 mg

## 2023-06-25 NOTE — Patient Instructions (Signed)

## 2023-06-25 NOTE — Progress Notes (Signed)
Left L4-L5 interlam injection- weakness in both legs.   Functional Pain Scale - descriptive words and definitions  Intense (8)    Cannot complete any ADLs without much assistance/cannot concentrate/conversation is difficult/unable to sleep and unable to use distraction. Severe range order  Average Pain 10   +Driver, -BT, -Dye Allergies.

## 2023-07-08 NOTE — Procedures (Signed)
Lumbar Epidural Steroid Injection - Interlaminar Approach with Fluoroscopic Guidance  Patient: Michaela Guerra      Date of Birth: [birth year] MRN: [id number] PCP: Tracey Harries, MD      Visit Date: 06/25/2023   Universal Protocol:     Consent Given By: the patient  Position: PRONE  Additional Comments: Vital signs were monitored before and after the procedure. Patient was prepped and draped in the usual sterile fashion. The correct patient, procedure, and site was verified.   Injection Procedure Details:   Procedure diagnoses: Lumbar radiculopathy [M54.16]   Meds Administered:  Meds ordered this encounter  Medications   methylPREDNISolone acetate (DEPO-MEDROL) injection 80 mg     Laterality: Left  Location/Site:  L5-S1  Needle: 3.5 in., 20 ga. Tuohy  Needle Placement: Paramedian epidural  Findings:   -Comments: Excellent flow of contrast into the epidural space.  Procedure Details: Using a paramedian approach from the side mentioned above, the region overlying the inferior lamina was localized under fluoroscopic visualization and the soft tissues overlying this structure were infiltrated with 4 ml. of 1% Lidocaine without Epinephrine. The Tuohy needle was inserted into the epidural space using a paramedian approach.   The epidural space was localized using loss of resistance along with counter oblique bi-planar fluoroscopic views.  After negative aspirate for air, blood, and CSF, a 2 ml. volume of Isovue-250 was injected into the epidural space and the flow of contrast was observed. Radiographs were obtained for documentation purposes.    The injectate was administered into the level noted above.   Additional Comments:  No complications occurred Dressing: 2 x 2 sterile gauze and Band-Aid    Post-procedure details: Patient was observed during the procedure. Post-procedure instructions were reviewed.  Patient left the clinic in stable condition.

## 2023-07-08 NOTE — Progress Notes (Signed)
Michaela Guerra - 77 y.o. female MRN [id number]  Date of birth: 16-Dec-1945  Office Visit Note: Visit Date: 06/25/2023 PCP: Tracey Harries, MD Referred by: Tracey Harries, MD  Subjective: Chief Complaint  Patient presents with   Lower Back - Pain   HPI:  Michaela Guerra is a 77 y.o. female who comes in today at the request of Rexene Edison, PA-C for planned Left L4-5 Lumbar Interlaminar epidural steroid injection with fluoroscopic guidance.  The patient has failed conservative care including home exercise, medications, time and activity modification.  This injection will be diagnostic and hopefully therapeutic.  Please see requesting physician notes for further details and justification.   ROS Otherwise per HPI.  Assessment & Plan: Visit Diagnoses:    ICD-10-CM   1. Lumbar radiculopathy  M54.16 XR C-ARM NO REPORT    Epidural Steroid injection    methylPREDNISolone acetate (DEPO-MEDROL) injection 80 mg      Plan: No additional findings.   Meds & Orders:  Meds ordered this encounter  Medications   methylPREDNISolone acetate (DEPO-MEDROL) injection 80 mg    Orders Placed This Encounter  Procedures   XR C-ARM NO REPORT   Epidural Steroid injection    Follow-up: Return for visit to requesting provider as needed.   Procedures: No procedures performed  Lumbar Epidural Steroid Injection - Interlaminar Approach with Fluoroscopic Guidance  Patient: Michaela Guerra      Date of Birth: November 01, 1946 MRN: [id number] PCP: Tracey Harries, MD      Visit Date: 06/25/2023   Universal Protocol:     Consent Given By: the patient  Position: PRONE  Additional Comments: Vital signs were monitored before and after the procedure. Patient was prepped and draped in the usual sterile fashion. The correct patient, procedure, and site was verified.   Injection Procedure Details:   Procedure diagnoses: Lumbar radiculopathy [M54.16]   Meds Administered:  Meds ordered this encounter  Medications    methylPREDNISolone acetate (DEPO-MEDROL) injection 80 mg     Laterality: Left  Location/Site:  L5-S1  Needle: 3.5 in., 20 ga. Tuohy  Needle Placement: Paramedian epidural  Findings:   -Comments: Excellent flow of contrast into the epidural space.  Procedure Details: Using a paramedian approach from the side mentioned above, the region overlying the inferior lamina was localized under fluoroscopic visualization and the soft tissues overlying this structure were infiltrated with 4 ml. of 1% Lidocaine without Epinephrine. The Tuohy needle was inserted into the epidural space using a paramedian approach.   The epidural space was localized using loss of resistance along with counter oblique bi-planar fluoroscopic views.  After negative aspirate for air, blood, and CSF, a 2 ml. volume of Isovue-250 was injected into the epidural space and the flow of contrast was observed. Radiographs were obtained for documentation purposes.    The injectate was administered into the level noted above.   Additional Comments:  No complications occurred Dressing: 2 x 2 sterile gauze and Band-Aid    Post-procedure details: Patient was observed during the procedure. Post-procedure instructions were reviewed.  Patient left the clinic in stable condition.   Clinical History: MRI LUMBAR SPINE WITHOUT CONTRAST   TECHNIQUE: Multiplanar, multisequence MR imaging of the lumbar spine was performed. No intravenous contrast was administered.   COMPARISON:  03/28/2021   FINDINGS: Segmentation:  Standard.   Alignment: Stable 3 mm retrolisthesis of L4 on L5. grade 1 anterolisthesis of L3 on L4.   Vertebrae: No acute fracture, evidence of discitis, or aggressive bone  lesion. Chronic L4 vertebral body compression fracture with prior augmentation.   Conus medullaris and cauda equina: Conus extends to the T12-L1 level. Conus and cauda equina appear normal.   Paraspinal and other soft tissues: No acute  paraspinal abnormality.   Disc levels:   Disc spaces: Disc desiccation T12-L1, L1-2, L2-3, L3-4 and L4-5. Mild disc height loss at L4-5.   T11-T12: Small left paracentral disc protrusion. No foraminal or central canal stenosis.   T12-L1: No significant disc bulge. No neural foraminal stenosis. No central canal stenosis.   L1-L2: Broad-based disc bulge with a small central disc protrusion. Mild bilateral facet arthropathy. No foraminal or central canal stenosis.   L2-L3: Mild broad-based disc bulge. Moderate right and mild left facet arthropathy. No foraminal or central canal stenosis.   L3-L4: Mild broad-based disc bulge with a small central disc protrusion and inferior migration of disc material. Severe bilateral facet arthropathy. Mild-moderate spinal stenosis. No foraminal stenosis.   L4-L5: Broad-based disc bulge with a broad shallow left paracentral disc protrusion. Bilateral subarticular recess stenosis. Moderate left and mild right foraminal stenosis.   L5-S1: No significant disc bulge. No neural foraminal stenosis. No central canal stenosis. Mild bilateral facet arthropathy.   IMPRESSION: 1. Lumbar spine spondylosis as described above. 2. No acute osseous injury of the lumbar spine.     Electronically Signed   By: Elige Ko M.D.   On: 05/21/2023 10:18     Objective:  VS:  HT:    WT:   BMI:     BP:(!) 106/52  HR:70bpm  TEMP: ( )  RESP:  Physical Exam Vitals and nursing note reviewed.  Constitutional:      General: She is not in acute distress.    Appearance: Normal appearance. She is not ill-appearing.  HENT:     Head: Normocephalic and atraumatic.     Right Ear: External ear normal.     Left Ear: External ear normal.  Eyes:     Extraocular Movements: Extraocular movements intact.  Cardiovascular:     Rate and Rhythm: Normal rate.     Pulses: Normal pulses.  Pulmonary:     Effort: Pulmonary effort is normal. No respiratory distress.   Abdominal:     General: There is no distension.     Palpations: Abdomen is soft.  Musculoskeletal:        General: Tenderness present.     Cervical back: Neck supple.     Right lower leg: No edema.     Left lower leg: No edema.     Comments: Patient has good distal strength with no pain over the greater trochanters.  No clonus or focal weakness.  Skin:    Findings: No erythema, lesion or rash.  Neurological:     General: No focal deficit present.     Mental Status: She is alert and oriented to person, place, and time.     Sensory: No sensory deficit.     Motor: No weakness or abnormal muscle tone.     Coordination: Coordination normal.  Psychiatric:        Mood and Affect: Mood normal.        Behavior: Behavior normal.      Imaging: No results found.

## 2023-10-17 ENCOUNTER — Other Ambulatory Visit: Payer: Self-pay

## 2023-10-17 DIAGNOSIS — I712 Thoracic aortic aneurysm, without rupture, unspecified: Secondary | ICD-10-CM

## 2023-10-27 ENCOUNTER — Other Ambulatory Visit (HOSPITAL_COMMUNITY): Payer: Self-pay | Admitting: Gastroenterology

## 2023-10-27 DIAGNOSIS — K7460 Unspecified cirrhosis of liver: Secondary | ICD-10-CM

## 2023-10-31 ENCOUNTER — Other Ambulatory Visit (HOSPITAL_COMMUNITY): Payer: Self-pay | Admitting: Gastroenterology

## 2023-10-31 ENCOUNTER — Ambulatory Visit
Admission: RE | Admit: 2023-10-31 | Discharge: 2023-10-31 | Disposition: A | Discharge status: Home / Self Care | Payer: 59 | Source: Ambulatory Visit | Attending: Vascular Surgery | Admitting: Vascular Surgery

## 2023-10-31 DIAGNOSIS — I712 Thoracic aortic aneurysm, without rupture, unspecified: Secondary | ICD-10-CM

## 2023-10-31 DIAGNOSIS — K7460 Unspecified cirrhosis of liver: Secondary | ICD-10-CM

## 2023-10-31 MED ORDER — IOPAMIDOL (ISOVUE-370) INJECTION 76%
500.0000 mL | Freq: Once | INTRAVENOUS | Status: AC | PRN
  Administered 2023-10-31: 95 mL via INTRAVENOUS

## 2023-11-05 ENCOUNTER — Ambulatory Visit: Payer: 59 | Admitting: Vascular Surgery

## 2023-11-05 DIAGNOSIS — I712 Thoracic aortic aneurysm, without rupture, unspecified: Secondary | ICD-10-CM

## 2023-11-05 NOTE — Progress Notes (Signed)
Virtual Visit via Telephone Note  I connected with Michaela Guerra on 11/05/2023 using the [email or web address] by telephone and verified that I was speaking with the correct person using two identifiers. Patient was located at home and alone.  I am located at the office alone.   The limitations of evaluation and management by telemedicine and the availability of in person appointments have been previously discussed with the patient and are documented in the patients chart. The patient expressed understanding and consented to proceed.  History of Present Illness:  Michaela Guerra is a 77 y.o. female with history of thoracic aneurysm repair by Dr. Darrick Penna almost 4 years ago.  This was done with cutdown exposure we will of the right common iliac artery with placement of dacryon conduit and placement of 2 Gore thoracic stent graft and carotid subclavian bypass on the left.  She has done very well from this.  She is now following up to discuss recent CT scan.  Past Medical History:  Diagnosis Date   Allergy    Aneurysm of aorta (HCC) 12/2019   Anxiety    Arthritis    Bleeding diathesis (H[facility])    Hx of    Breast cancer (HCC)    Adenocarcinoma, radiation and surgery; at age 63   Closed fracture of left distal radius    COLONIC POLYPS, HYPERPLASTIC 07/12/2009   Qualifier: Diagnosis of  By: Delrae Alfred MD, Elizabeth     Complication of anesthesia    Hard to wake up.   COPD (chronic obstructive pulmonary disease) (HCC)    heavy smoker 1ppd   CVA (cerebral vascular accident) ([facility]) 11/2013   no deficits   DECREASED HEARING, LEFT EAR 05/03/2010   Qualifier: Diagnosis of  By: Felicity Coyer MD, Raenette Rover    Depression    Derangement of anterior horn of lateral meniscus 04/24/2009   Annotation: Underwent right knee arthroscopic surgery 09/20/09: For  chondromalacia and anterolateral meniscectomy Qualifier: Diagnosis of  By: Delrae Alfred MD, Lanora Manis     GERD (gastroesophageal reflux disease)    Hyperlipidemia     Hypertension    Hypothyroidism    Polio    Dx at age 10 yrs   Smoker    1ppd   Thoracic aortic aneurysm (TAA) (HCC)    6.6 cm posterior arotic arch TAA 11/09/19    Past Surgical History:  Procedure Laterality Date   BREAST SURGERY Left    lumpectomy   CAROTID-SUBCLAVIAN BYPASS GRAFT Left 12/06/2019   Procedure: BYPASS GRAFT CAROTID-SUBCLAVIAN LEFT;  Surgeon: Sherren Kerns, MD;  [facility];  Service: Vascular;  Laterality: Left;   EMBOLECTOMY Right 12/13/2019   Procedure: EMBOLECTOMY RIGHT SUBCLAVIAN;  Surgeon: Sherren Kerns, MD;  Location[facility];  Service: Vascular;  Laterality: Right;   EYE SURGERY     Bilateral cataracts   OPEN REDUCTION INTERNAL FIXATION (ORIF) DISTAL RADIAL FRACTURE Left 10/18/2019   Procedure: OPEN REDUCTION INTERNAL FIXATION (ORIF) DISTAL RADIAL FRACTURE;  Surgeon: Mack Hook, MD;  Location: [facility];  Service: Orthopedics;  Laterality: Left;  SURGERY REQUEST TIME: 75 MIN  REGIONAL BLOCK   THORACIC AORTIC ENDOVASCULAR STENT GRAFT  12/13/2019   THORACIC AORTIC ENDOVASCULAR STENT GRAFT N/A 12/13/2019   Procedure: THORACIC AORTIC ENDOVASCULAR STENT GRAFT WITH RIGHT ABDOMEN CONDUIT;  Surgeon: Sherren Kerns, MD;  Location: [facility] OR;  Service: Vascular;  Laterality: N/A;   TOTAL HIP ARTHROPLASTY Right 10/04/2014   Procedure: TOTAL HIP ARTHROPLASTY ANTERIOR APPROACH;  Surgeon: Cristal Deer  Aretha Parrot, MD;  Location: [facility] OR;  Service: Orthopedics;  Laterality: Right;    No outpatient medications have been marked as taking for the 11/05/23 encounter (Appointment) with Maeola Harman, MD.    12 system ROS was negative unless otherwise noted in HPI   Observations/Objective: She demonstrates good understanding of our conversation today   I have independently reviewed her recent CT angio and discussed the results with the patient.  Formal read is pending.  Assessment and Plan:   77 year old female with history as above.  We  had a phone visit today due to patient's mobility limitations.  Discussed that given that she is almost 4 years post procedure with stable aneurysm she is okay for follow-up in 2 years with repeat CT angio.  When the formal report of the CT is available I will communicate any discrepancies to the patient directly.  She demonstrates good understanding of our conversation today.  I spent 5 minutes with the patient via telephone encounter.   Signed, Luanna Salk. Randie Heinz, MD [facility] [address] Office: [phone] Pager: [phone]   11/05/2023, 3:59 PM

## 2023-11-07 ENCOUNTER — Encounter (HOSPITAL_COMMUNITY): Payer: Self-pay

## 2023-11-07 ENCOUNTER — Ambulatory Visit (HOSPITAL_COMMUNITY): Admission: RE | Admit: 2023-11-07 | Payer: 59 | Source: Ambulatory Visit

## 2023-11-08 NOTE — Progress Notes (Unsigned)
Cardiology Office Note:   Date:  11/08/2023  ID:  Michaela Guerra, DOB 05/21/1946, MRN [id number] PCP: Tracey Harries, MD  [facility] Providers Cardiologist:  Rollene Rotunda, MD {  History of Present Illness:   Michaela Guerra is a 77 y.o. female who presents for evaluation of chest pain.  He had minimal plaque on cardiac catheterization in 2007. In 2018 she had a had burning sensation in the chest.  Myoview was recommended and was done on 04/24/2017 which showed EF 61%, no evidence of prior infarct or ischemia.   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    The patient had placement of 37 x 37 x 15 cm Gore thoracic aneurysm stent graft with coverage of the left subclavian artery.  She had placement of 28 x 28 x 10 cm Gore thoracic aneurysm stent graft without coverage of the left subclavian artery.  She had coil embolization.  CT in Nov 2024 demonstrated stable stent graft repair.  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   She was seen in follow up by Lemar Livings.  ***    Since I last saw her ***   ***   she has had no new cardiovascular complaints.  She fell and injured her back.  She had lots of problems with back pain.  Her daughter thinks she was addicted to pain medicines for little while and they got her off of this.  We are trying to manage this conservatively and not do surgery.  There is a family history of hemophilia.  The patient did see [facility] and have follow-up CT in September that demonstrated stable aneurysm repair.  She has had a cough which she feels is related to sinus drainage.  Otherwise she has had no other chest complaints.  She is not having any fevers or chills.  She is not having any new shortness of breath, PND or orthopnea.  She is had no new palpitations, presyncope or syncope.  She does get around slowly with a walker.  ROS: ***  Studies Reviewed:     EKG:       ***  Risk Assessment/Calculations:   {Does this patient have ATRIAL FIBRILLATION?:[phone]} No BP recorded.  {Refresh Note OR Click here to enter BP  :1}***        Physical Exam:   VS:  There were no vitals taken for this visit.   Wt Readings from Last 3 Encounters:  04/30/23 150 lb (68 kg)  10/02/22 150 lb (68 kg)  10/12/21 149 lb 9.6 oz (67.9 kg)     GEN: Well nourished, well developed in no acute distress NECK: No JVD; No carotid bruits CARDIAC: ***RR, *** murmurs, rubs, gallops RESPIRATORY:  Clear to auscultation without rales, wheezing or rhonchi  ABDOMEN: Soft, non-tender, non-distended EXTREMITIES:  No edema; No deformity   ASSESSMENT AND PLAN:   THORACIC AORTIC ANEURYSM:    ***  This is stable.  We are continuing risk reduction.    HTN: Her blood pressure is ***  at target.  No change in therapy.    DYSLIPIDEMIA: Her last LDL was ***  50.  However, she is having trouble with the Repatha injections and so we have the pharmacist working with her.  She has been followed by Dr. Rennis Golden.    TOBACCO ABUSE:   ***  Unfortunately she is very addicted.  Follow up ***  Signed, Rollene Rotunda, MD

## 2023-11-11 ENCOUNTER — Ambulatory Visit: Payer: 59 | Admitting: Cardiology

## 2023-11-11 DIAGNOSIS — I712 Thoracic aortic aneurysm, without rupture, unspecified: Secondary | ICD-10-CM

## 2023-11-11 DIAGNOSIS — Z720 Tobacco use: Secondary | ICD-10-CM

## 2023-11-11 DIAGNOSIS — E785 Hyperlipidemia, unspecified: Secondary | ICD-10-CM

## 2023-11-11 DIAGNOSIS — I10 Essential (primary) hypertension: Secondary | ICD-10-CM

## 2023-11-12 ENCOUNTER — Ambulatory Visit: Payer: 59 | Admitting: Vascular Surgery

## 2023-12-29 ENCOUNTER — Ambulatory Visit (HOSPITAL_COMMUNITY): Payer: 59

## 2024-01-16 ENCOUNTER — Other Ambulatory Visit: Payer: Self-pay | Admitting: Internal Medicine

## 2024-01-16 DIAGNOSIS — M791 Myalgia, unspecified site: Secondary | ICD-10-CM

## 2024-01-16 DIAGNOSIS — I712 Thoracic aortic aneurysm, without rupture, unspecified: Secondary | ICD-10-CM

## 2024-01-16 DIAGNOSIS — E785 Hyperlipidemia, unspecified: Secondary | ICD-10-CM

## 2024-01-19 ENCOUNTER — Encounter: Payer: Self-pay | Admitting: Pharmacist

## 2024-01-19 NOTE — Telephone Encounter (Signed)
 Hasn't been seen in 2 years Sent my chart message that she needs apt for refills

## 2024-01-28 ENCOUNTER — Ambulatory Visit (HOSPITAL_COMMUNITY): Payer: 59

## 2024-02-02 ENCOUNTER — Telehealth: Payer: 59 | Admitting: Internal Medicine

## 2024-02-12 ENCOUNTER — Ambulatory Visit: Payer: 59

## 2024-02-13 ENCOUNTER — Ambulatory Visit (HOSPITAL_COMMUNITY)
Admission: RE | Admit: 2024-02-13 | Discharge: 2024-02-13 | Disposition: A | Discharge status: Home / Self Care | Source: Ambulatory Visit | Attending: Gastroenterology | Admitting: Gastroenterology

## 2024-02-13 ENCOUNTER — Ambulatory Visit (HOSPITAL_COMMUNITY)

## 2024-02-13 DIAGNOSIS — K7460 Unspecified cirrhosis of liver: Secondary | ICD-10-CM | POA: Insufficient documentation
